# Patient Record
Sex: Male | Born: 1935
Health system: Southern US, Community
[De-identification: ages and names within clinical notes are randomized; demographics above are authoritative.]

## PROBLEM LIST (undated history)

## (undated) DIAGNOSIS — Z87442 Personal history of urinary calculi: Secondary | ICD-10-CM

## (undated) DIAGNOSIS — I4891 Unspecified atrial fibrillation: Secondary | ICD-10-CM

## (undated) DIAGNOSIS — I509 Heart failure, unspecified: Secondary | ICD-10-CM

## (undated) DIAGNOSIS — Z95810 Presence of automatic (implantable) cardiac defibrillator: Secondary | ICD-10-CM

## (undated) DIAGNOSIS — Z8739 Personal history of other diseases of the musculoskeletal system and connective tissue: Secondary | ICD-10-CM

## (undated) DIAGNOSIS — M1990 Unspecified osteoarthritis, unspecified site: Secondary | ICD-10-CM

## (undated) DIAGNOSIS — E785 Hyperlipidemia, unspecified: Secondary | ICD-10-CM

## (undated) DIAGNOSIS — I5022 Chronic systolic (congestive) heart failure: Secondary | ICD-10-CM

## (undated) DIAGNOSIS — J189 Pneumonia, unspecified organism: Secondary | ICD-10-CM

## (undated) DIAGNOSIS — N179 Acute kidney failure, unspecified: Secondary | ICD-10-CM

## (undated) DIAGNOSIS — R296 Repeated falls: Secondary | ICD-10-CM

## (undated) DIAGNOSIS — I10 Essential (primary) hypertension: Secondary | ICD-10-CM

## (undated) DIAGNOSIS — Z9289 Personal history of other medical treatment: Secondary | ICD-10-CM

## (undated) HISTORY — PX: LITHOTRIPSY: SUR834

## (undated) HISTORY — PX: POLYPECTOMY: SHX149

## (undated) HISTORY — DX: Chronic systolic (congestive) heart failure: I50.22

## (undated) HISTORY — DX: Unspecified atrial fibrillation: I48.91

## (undated) HISTORY — DX: Hyperlipidemia, unspecified: E78.5

---

## 1998-03-11 ENCOUNTER — Other Ambulatory Visit: Admission: RE | Admit: 1998-03-11 | Discharge: 1998-03-11 | Payer: Self-pay | Admitting: Family Medicine

## 1998-04-24 ENCOUNTER — Inpatient Hospital Stay (HOSPITAL_COMMUNITY): Admission: EM | Admit: 1998-04-24 | Discharge: 1998-04-29 | Payer: Self-pay | Admitting: Emergency Medicine

## 1998-04-28 ENCOUNTER — Encounter: Payer: Self-pay | Admitting: Interventional Cardiology

## 1998-06-18 ENCOUNTER — Ambulatory Visit (HOSPITAL_COMMUNITY): Admission: RE | Admit: 1998-06-18 | Discharge: 1998-06-18 | Payer: Self-pay | Admitting: Family Medicine

## 2002-04-05 ENCOUNTER — Encounter: Admission: RE | Admit: 2002-04-05 | Discharge: 2002-04-05 | Payer: Self-pay | Admitting: Family Medicine

## 2002-04-05 ENCOUNTER — Encounter: Payer: Self-pay | Admitting: Family Medicine

## 2002-04-10 ENCOUNTER — Encounter: Payer: Self-pay | Admitting: Family Medicine

## 2002-04-10 ENCOUNTER — Encounter: Admission: RE | Admit: 2002-04-10 | Discharge: 2002-04-10 | Payer: Self-pay | Admitting: Family Medicine

## 2005-12-31 ENCOUNTER — Encounter: Admission: RE | Admit: 2005-12-31 | Discharge: 2005-12-31 | Payer: Self-pay | Admitting: Family Medicine

## 2006-01-12 ENCOUNTER — Encounter: Payer: Self-pay | Admitting: Family Medicine

## 2006-10-14 ENCOUNTER — Ambulatory Visit (HOSPITAL_COMMUNITY): Admission: RE | Admit: 2006-10-14 | Discharge: 2006-10-14 | Payer: Self-pay | Admitting: Family Medicine

## 2007-09-25 ENCOUNTER — Ambulatory Visit (HOSPITAL_COMMUNITY): Admission: RE | Admit: 2007-09-25 | Discharge: 2007-09-25 | Payer: Self-pay | Admitting: Family Medicine

## 2007-09-27 ENCOUNTER — Ambulatory Visit (HOSPITAL_COMMUNITY): Admission: RE | Admit: 2007-09-27 | Discharge: 2007-09-27 | Payer: Self-pay | Admitting: Family Medicine

## 2008-06-03 ENCOUNTER — Ambulatory Visit (HOSPITAL_COMMUNITY): Admission: RE | Admit: 2008-06-03 | Discharge: 2008-06-03 | Payer: Self-pay | Admitting: Urology

## 2008-07-25 ENCOUNTER — Ambulatory Visit (HOSPITAL_COMMUNITY): Admission: RE | Admit: 2008-07-25 | Discharge: 2008-07-25 | Payer: Self-pay | Admitting: Urology

## 2009-10-14 ENCOUNTER — Ambulatory Visit: Payer: Self-pay | Admitting: Internal Medicine

## 2009-10-14 ENCOUNTER — Inpatient Hospital Stay (HOSPITAL_COMMUNITY): Admission: AD | Admit: 2009-10-14 | Discharge: 2009-10-31 | Payer: Self-pay | Admitting: Interventional Cardiology

## 2009-10-14 HISTORY — PX: CARDIAC DEFIBRILLATOR PLACEMENT: SHX171

## 2009-10-15 ENCOUNTER — Encounter (INDEPENDENT_AMBULATORY_CARE_PROVIDER_SITE_OTHER): Payer: Self-pay | Admitting: Interventional Cardiology

## 2009-12-25 ENCOUNTER — Encounter (INDEPENDENT_AMBULATORY_CARE_PROVIDER_SITE_OTHER): Payer: Self-pay | Admitting: *Deleted

## 2009-12-31 ENCOUNTER — Encounter: Payer: Self-pay | Admitting: Internal Medicine

## 2010-01-01 ENCOUNTER — Ambulatory Visit: Payer: Self-pay

## 2010-01-01 ENCOUNTER — Encounter: Payer: Self-pay | Admitting: Internal Medicine

## 2010-02-10 ENCOUNTER — Ambulatory Visit: Payer: Self-pay | Admitting: Internal Medicine

## 2010-02-10 DIAGNOSIS — I480 Paroxysmal atrial fibrillation: Secondary | ICD-10-CM

## 2010-02-10 DIAGNOSIS — Z95810 Presence of automatic (implantable) cardiac defibrillator: Secondary | ICD-10-CM

## 2010-02-10 DIAGNOSIS — I5022 Chronic systolic (congestive) heart failure: Secondary | ICD-10-CM

## 2010-02-10 DIAGNOSIS — N184 Chronic kidney disease, stage 4 (severe): Secondary | ICD-10-CM | POA: Insufficient documentation

## 2010-10-13 NOTE — Letter (Signed)
Summary: Device-Delinquent Check  [facility], Main Office  [address]   [place], [place] [address]   Phone: [phone]  Fax: [phone]     December 25, 2009 MRN: [id number]   MANFRED LASPINA [address] [place], [place]  [address]   Dear Mr. Ehrsam,  According to our records, you have not had your implanted device checked in the recommended period of time.  We are unable to determine appropriate device function without checking your device on a regular basis.  Please call our office to schedule an appointment as soon as possible.  If you are having your device checked by another physician, please call us so that we may update our records.  Thank you,  Altha Harm, LPN  December 25, 2009 3:56 PM  [facility]

## 2010-10-13 NOTE — Assessment & Plan Note (Signed)
Summary: DEVICE/SAF   Visit Type:  Follow-up Primary Provider:  Ronne Binning, MD   History of Present Illness: [name] returns today for ICD followup.  He is a pleasant 77 yo man with a h/o DCM and class 3B CHF. He underwent  BiV ICD 3 months ago and since then has improved.  When he initially presented, he was in shock but gradually improved.  He denies c/p.  He has rare palpitations.  He notes that his heart rate is sometimes low.  No peripheral edema.  Overall his CHF is much improved.  He has not been back in the hospital.    Current Medications (verified): 1)  Amiodarone Hcl 200 Mg Tabs (Amiodarone Hcl) .... Take One Tablet By Mouth Daily 2)  Spironolactone 25 Mg Tabs (Spironolactone) .... Take One Tablet By Mouth Daily 3)  Isosorbide Mononitrate Cr 60 Mg Xr24h-Tab (Isosorbide Mononitrate) .... Take One Tablet By Mouth Daily 4)  Simvastatin 40 Mg Tabs (Simvastatin) .... Take One Tablet By Mouth Daily At Bedtime 5)  Warfarin Sodium 3 Mg Tabs (Warfarin Sodium) .... Use As Directed By Anticoagualtion Clinic 6)  Carvedilol 6.25 Mg Tabs (Carvedilol) .... Take One Tablet By Mouth Twice A Day 7)  Potassium Chloride Crys Cr 20 Meq Cr-Tabs (Potassium Chloride Crys Cr) .... Take One Tablet By Mouth Daily 8)  Furosemide 40 Mg Tabs (Furosemide) .... Take 2 Tablets By Mouth Two Times A Day 9)  Ventolin Hfa 108 (90 Base) Mcg/act Aers (Albuterol Sulfate) .... Uad 10)  Colcrys 0.6 Mg Tabs (Colchicine) .... Take One Tablet By Mouth Once Daily.  Past History:  Past Medical History: Current Problems:  IMPLANTATION OF DEFIBRILLATOR, HX OF (ICD-V45.02) RENAL FAILURE, ACUTE (ICD-584.9) ATRIAL FIBRILLATION (ICD-427.31)    Review of Systems  The patient denies chest pain, syncope, dyspnea on exertion, and peripheral edema.    Vital Signs:  Patient profile:   77 year old male Height:      67 inches Weight:      147 pounds BMI:     23.11 Pulse rate:   78 / minute Pulse rhythm:   regular BP  sitting:   88 / 70  (left arm)  Vitals Entered By: Laurance Flatten CMA (Feb 10, 2010 3:23 PM)  Physical Exam  General:  Well developed, well nourished, in no acute distress.  HEENT: normal Neck: supple. No JVD. Carotids 2+ bilaterally no bruits Cor: RRR no rubs, gallops or murmur. PMI is enlarged and laterally displaced. Lungs: CTA with no wheezes, rales, or rhonchi. Ab: soft, nontender. nondistended. No HSM. Good bowel sounds Ext: warm. no cyanosis, clubbing or edema Neuro: alert and oriented. Grossly nonfocal. affect pleasant     ICD Specifications Following MD:  [name], MD     ICD Vendor:  St Jude     ICD Model Number:  [id number]     ICD Serial Number:  [id number] ICD DOI:  10/24/2009     ICD Implanting MD:  [name], MD  Lead 1:    Location: RA     DOI: 10/24/2009     Model #: [id number]     Serial #: [id number]     Status: active Lead 2:    Location: RV     DOI: 10/24/2009     Model #: 0865     Serial #: [id number]     Status: active Lead 3:    Location: LV     DOI: 10/24/2009     Model #: [id number]  Serial #: [id number]     Status: active  ICD Follow Up ICD Dependent:  No      Episodes Coumadin:  No  Brady Parameters Mode DDDR     Lower Rate Limit:  60     Upper Rate Limit 120 PAV 180     Sensed AV Delay:  160  Tachy Zones VF:  181     MD Comments:  LV output reprogrammed with 2 X safety margin.  Impression & Recommendations:  Problem # 1:  IMPLANTATION OF DEFIBRILLATOR, HX OF (ICD-V45.02) His device is working normally.  Will recheck in several months.  Problem # 2:  CHRONIC SYSTOLIC HEART FAILURE (ICD-428.22) His symptoms are now class 2.  Will continue his current meds and maintain a low sodium diet. His updated medication list for this problem includes:    Amiodarone Hcl 200 Mg Tabs (Amiodarone hcl) .[name]... Take one tablet by mouth daily    Spironolactone 25 Mg Tabs (Spironolactone) .[name]... Take one tablet by mouth daily    Isosorbide Mononitrate Cr 60 Mg Xr24h-tab  (Isosorbide mononitrate) .[name]... Take one tablet by mouth daily    Warfarin Sodium 3 Mg Tabs (Warfarin sodium) ..... Use as directed by anticoagualtion clinic    Carvedilol 6.25 Mg Tabs (Carvedilol) .[name]... Take one tablet by mouth twice a day    Furosemide 40 Mg Tabs (Furosemide) .[name]... Take 2 tablets by mouth two times a day  Patient Instructions: 1)  Your physician recommends that you schedule a follow-up appointment in: Feb 2012

## 2010-10-13 NOTE — Cardiovascular Report (Signed)
Summary: Office Visit   Office Visit   Imported By: Roderic Ovens 02/13/2010 09:53:32  _____________________________________________________________________  External Attachment:    Type:   Image     Comment:   External Document

## 2010-10-13 NOTE — Miscellaneous (Signed)
Summary: Device preload  Clinical Lists Changes  Observations: Added new observation of ICDLEADSTAT3: active (12/31/2009 12:59) Added new observation of ICDLEADSER3: [id number] V (12/31/2009 12:59) Added new observation of ICDLEADMOD3: 4196  (12/31/2009 12:59) Added new observation of ICDLEADLOC3: LV  (12/31/2009 12:59) Added new observation of ICDLEADSTAT2: active  (12/31/2009 12:59) Added new observation of ICDLEADSER2: [id number]  (12/31/2009 12:59) Added new observation of ICDLEADMOD2: 7121  (12/31/2009 12:59) Added new observation of ICDLEADLOC2: RV  (12/31/2009 12:59) Added new observation of ICDLEADSTAT1: active  (12/31/2009 12:59) Added new observation of ICDLEADSER1: [id number]  (12/31/2009 12:59) Added new observation of ICDLEADMOD1: 3500XF  (12/31/2009 12:59) Added new observation of ICDLEADLOC1: RA  (12/31/2009 12:59) Added new observation of ICD IMP MD: [name], MD  (12/31/2009 12:59) Added new observation of ICDLEADDOI3: 10/24/2009  (12/31/2009 12:59) Added new observation of ICDLEADDOI2: 10/24/2009  (12/31/2009 12:59) Added new observation of ICDLEADDOI1: 10/24/2009  (12/31/2009 12:59) Added new observation of ICD IMPL DTE: 10/24/2009  (12/31/2009 12:59) Added new observation of ICD SERL#: [id number]  (12/31/2009 12:59) Added new observation of ICD MODL#: [id number]  (12/31/2009 78:93) Added new observation of ICDMANUFACTR: St Jude  (12/31/2009 12:59) Added new observation of ICD MD: [name], MD  (12/31/2009 12:59)       ICD Specifications Following MD:  [name], MD     ICD Vendor:  St Jude     ICD Model Number:  [id number]     ICD Serial Number:  [id number] ICD DOI:  10/24/2009     ICD Implanting MD:  [name], MD  Lead 1:    Location: RA     DOI: 10/24/2009     Model #: [id number]     Serial #: [id number]     Status: active Lead 2:    Location: RV     DOI: 10/24/2009     Model #: 1540     Serial #: [id number]     Status: active Lead 3:    Location: LV     DOI: 10/24/2009      Model #: 4196     Serial #: [id number]     Status: active

## 2010-10-13 NOTE — Procedures (Signed)
Summary: pcp/jml    ICD Specifications Following MD:  [name], MD     ICD Vendor:  St Jude     ICD Model Number:  [phone]     ICD Serial Number:  [id number] ICD DOI:  10/24/2009     ICD Implanting MD:  [name], MD  Lead 1:    Location: RA     DOI: 10/24/2009     Model #: [id number]     Serial #: [id number]     Status: active Lead 2:    Location: RV     DOI: 10/24/2009     Model #: 0865     Serial #: [id number]     Status: active Lead 3:    Location: LV     DOI: 10/24/2009     Model #: 4196     Serial #: [id number]     Status: active  ICD Follow Up Remote Check?  No Charge Time:  8.6 seconds     Battery Est. Longevity:  4.6 years Underlying rhythm:  SR ICD Dependent:  No       ICD Device Measurements Atrium:  Amplitude: 1.9 mV, Impedance: 430 ohms, Threshold: 0.875 V at 0.5 msec Right Ventricle:  Amplitude: 11.7 mV, Impedance: 480 ohms, Threshold: 0.625 V at 0.5 msec Left Ventricle:  Impedance: 790 ohms, Threshold: 2.25 V at 1.5 msec Shock Impedance: 52 ohms   Episodes MS Episodes:  0     Percent Mode Switch:  0     Coumadin:  No Shock:  0     ATP:  0     Nonsustained:  0     Atrial Pacing:  69%     Ventricular Pacing:  95%  Brady Parameters Mode DDDR     Lower Rate Limit:  60     Upper Rate Limit 120 PAV 180     Sensed AV Delay:  160  Tachy Zones VF:  181     Next Cardiology Appt Due:  01/11/2010 Tech Comments:  LV reprogrammed 3.5@1 .5 because of elevated threhold value today.  Steri strips removed by the patient.  The wound is well healed although the device on this small framed man is superficial. He also follows with Dr. Katrinka Blazing but will come back in one month for an appoointment with Dr. Ladona Ridgel.   Altha Harm, LPN  January 01, 2010 11:49 AM  MD Comments:  Agree with above.

## 2010-10-13 NOTE — Cardiovascular Report (Signed)
Summary: Office Visit   Office Visit   Imported By: Roderic Ovens 01/27/2010 13:49:22  _____________________________________________________________________  External Attachment:    Type:   Image     Comment:   External Document

## 2010-11-27 ENCOUNTER — Encounter (INDEPENDENT_AMBULATORY_CARE_PROVIDER_SITE_OTHER): Payer: Self-pay | Admitting: *Deleted

## 2010-12-01 NOTE — Letter (Signed)
Summary: Appointment - Reminder 2  [facility], Main Office  [address]   [place], [place] [address]   Phone: [phone]  Fax: [phone]     November 27, 2010 MRN: [id number]   Tyler Gray [address] [place], [place]  [address]   Dear Tyler Gray,  Our records indicate that it is time to schedule a follow-up appointment.  Dr.Taylor recommended that you follow up with [place] in June. It is very important that we reach you to schedule this appointment. We look forward to participating in your health care needs. Please contact us at the number listed above at your earliest convenience to schedule your appointment.  If you are unable to make an appointment at this time, give [place] a call so we can update our records.     Sincerely,   Glass blower/designer

## 2010-12-02 LAB — BASIC METABOLIC PANEL
BUN: 24 mg/dL — ABNORMAL HIGH (ref 6–23)
BUN: 29 mg/dL — ABNORMAL HIGH (ref 6–23)
BUN: 33 mg/dL — ABNORMAL HIGH (ref 6–23)
BUN: 40 mg/dL — ABNORMAL HIGH (ref 6–23)
BUN: 40 mg/dL — ABNORMAL HIGH (ref 6–23)
BUN: 41 mg/dL — ABNORMAL HIGH (ref 6–23)
BUN: 41 mg/dL — ABNORMAL HIGH (ref 6–23)
BUN: 42 mg/dL — ABNORMAL HIGH (ref 6–23)
BUN: 42 mg/dL — ABNORMAL HIGH (ref 6–23)
BUN: 44 mg/dL — ABNORMAL HIGH (ref 6–23)
BUN: 47 mg/dL — ABNORMAL HIGH (ref 6–23)
CO2: 21 mEq/L (ref 19–32)
CO2: 22 mEq/L (ref 19–32)
CO2: 23 mEq/L (ref 19–32)
CO2: 23 mEq/L (ref 19–32)
CO2: 24 mEq/L (ref 19–32)
CO2: 25 mEq/L (ref 19–32)
CO2: 25 mEq/L (ref 19–32)
CO2: 26 mEq/L (ref 19–32)
Calcium: 8.2 mg/dL — ABNORMAL LOW (ref 8.4–10.5)
Calcium: 8.2 mg/dL — ABNORMAL LOW (ref 8.4–10.5)
Calcium: 8.3 mg/dL — ABNORMAL LOW (ref 8.4–10.5)
Calcium: 8.4 mg/dL (ref 8.4–10.5)
Calcium: 8.5 mg/dL (ref 8.4–10.5)
Calcium: 8.6 mg/dL (ref 8.4–10.5)
Calcium: 8.6 mg/dL (ref 8.4–10.5)
Calcium: 8.7 mg/dL (ref 8.4–10.5)
Calcium: 8.7 mg/dL (ref 8.4–10.5)
Calcium: 8.8 mg/dL (ref 8.4–10.5)
Calcium: 8.8 mg/dL (ref 8.4–10.5)
Chloride: 101 mEq/L (ref 96–112)
Chloride: 101 mEq/L (ref 96–112)
Chloride: 102 mEq/L (ref 96–112)
Chloride: 102 mEq/L (ref 96–112)
Chloride: 103 mEq/L (ref 96–112)
Chloride: 103 mEq/L (ref 96–112)
Creatinine, Ser: 1.66 mg/dL — ABNORMAL HIGH (ref 0.4–1.5)
Creatinine, Ser: 1.67 mg/dL — ABNORMAL HIGH (ref 0.4–1.5)
Creatinine, Ser: 1.81 mg/dL — ABNORMAL HIGH (ref 0.4–1.5)
Creatinine, Ser: 1.88 mg/dL — ABNORMAL HIGH (ref 0.4–1.5)
Creatinine, Ser: 1.91 mg/dL — ABNORMAL HIGH (ref 0.4–1.5)
Creatinine, Ser: 1.94 mg/dL — ABNORMAL HIGH (ref 0.4–1.5)
Creatinine, Ser: 2.02 mg/dL — ABNORMAL HIGH (ref 0.4–1.5)
Creatinine, Ser: 2.02 mg/dL — ABNORMAL HIGH (ref 0.4–1.5)
Creatinine, Ser: 2.04 mg/dL — ABNORMAL HIGH (ref 0.4–1.5)
GFR calc Af Amer: 37 mL/min — ABNORMAL LOW (ref >60)
GFR calc Af Amer: 39 mL/min — ABNORMAL LOW (ref >60)
GFR calc Af Amer: 39 mL/min — ABNORMAL LOW (ref >60)
GFR calc Af Amer: 40 mL/min — ABNORMAL LOW (ref >60)
GFR calc Af Amer: 41 mL/min — ABNORMAL LOW (ref >60)
GFR calc Af Amer: 41 mL/min — ABNORMAL LOW (ref >60)
GFR calc Af Amer: 41 mL/min — ABNORMAL LOW (ref >60)
GFR calc Af Amer: 43 mL/min — ABNORMAL LOW (ref >60)
GFR calc Af Amer: 44 mL/min — ABNORMAL LOW (ref >60)
GFR calc non Af Amer: 30 mL/min — ABNORMAL LOW (ref >60)
GFR calc non Af Amer: 31 mL/min — ABNORMAL LOW (ref >60)
GFR calc non Af Amer: 32 mL/min — ABNORMAL LOW (ref >60)
GFR calc non Af Amer: 32 mL/min — ABNORMAL LOW (ref >60)
GFR calc non Af Amer: 32 mL/min — ABNORMAL LOW (ref >60)
GFR calc non Af Amer: 33 mL/min — ABNORMAL LOW (ref >60)
GFR calc non Af Amer: 34 mL/min — ABNORMAL LOW (ref >60)
GFR calc non Af Amer: 34 mL/min — ABNORMAL LOW (ref >60)
GFR calc non Af Amer: 35 mL/min — ABNORMAL LOW (ref >60)
GFR calc non Af Amer: 35 mL/min — ABNORMAL LOW (ref >60)
GFR calc non Af Amer: 37 mL/min — ABNORMAL LOW (ref >60)
GFR calc non Af Amer: 40 mL/min — ABNORMAL LOW (ref >60)
GFR calc non Af Amer: 41 mL/min — ABNORMAL LOW (ref >60)
Glucose, Bld: 110 mg/dL — ABNORMAL HIGH (ref 70–99)
Glucose, Bld: 111 mg/dL — ABNORMAL HIGH (ref 70–99)
Glucose, Bld: 115 mg/dL — ABNORMAL HIGH (ref 70–99)
Glucose, Bld: 119 mg/dL — ABNORMAL HIGH (ref 70–99)
Glucose, Bld: 120 mg/dL — ABNORMAL HIGH (ref 70–99)
Glucose, Bld: 125 mg/dL — ABNORMAL HIGH (ref 70–99)
Glucose, Bld: 140 mg/dL — ABNORMAL HIGH (ref 70–99)
Glucose, Bld: 143 mg/dL — ABNORMAL HIGH (ref 70–99)
Potassium: 3.8 mEq/L (ref 3.5–5.1)
Potassium: 4 mEq/L (ref 3.5–5.1)
Potassium: 4 mEq/L (ref 3.5–5.1)
Potassium: 4.1 mEq/L (ref 3.5–5.1)
Potassium: 4.2 mEq/L (ref 3.5–5.1)
Potassium: 4.4 mEq/L (ref 3.5–5.1)
Potassium: 4.4 mEq/L (ref 3.5–5.1)
Potassium: 4.4 mEq/L (ref 3.5–5.1)
Potassium: 4.6 mEq/L (ref 3.5–5.1)
Potassium: 4.8 mEq/L (ref 3.5–5.1)
Sodium: 133 mEq/L — ABNORMAL LOW (ref 135–145)
Sodium: 133 mEq/L — ABNORMAL LOW (ref 135–145)
Sodium: 134 mEq/L — ABNORMAL LOW (ref 135–145)
Sodium: 134 mEq/L — ABNORMAL LOW (ref 135–145)
Sodium: 134 mEq/L — ABNORMAL LOW (ref 135–145)
Sodium: 136 mEq/L (ref 135–145)
Sodium: 137 mEq/L (ref 135–145)
Sodium: 138 mEq/L (ref 135–145)
Sodium: 138 mEq/L (ref 135–145)
Sodium: 139 mEq/L (ref 135–145)

## 2010-12-02 LAB — DIFFERENTIAL
Basophils Absolute: 0 10*3/uL (ref 0.0–0.1)
Basophils Absolute: 0 10*3/uL (ref 0.0–0.1)
Basophils Absolute: 0 10*3/uL (ref 0.0–0.1)
Basophils Relative: 1 % (ref 0–1)
Eosinophils Absolute: 0 10*3/uL (ref 0.0–0.7)
Eosinophils Relative: 2 % (ref 0–5)
Lymphocytes Relative: 19 % (ref 12–46)
Lymphocytes Relative: 2 % — ABNORMAL LOW (ref 12–46)
Lymphocytes Relative: 27 % (ref 12–46)
Lymphs Abs: 0.3 10*3/uL — ABNORMAL LOW (ref 0.7–4.0)
Lymphs Abs: 1.1 10*3/uL (ref 0.7–4.0)
Lymphs Abs: 1.1 10*3/uL (ref 0.7–4.0)
Monocytes Relative: 10 % (ref 3–12)
Neutro Abs: 2.6 10*3/uL (ref 1.7–7.7)
Neutro Abs: 3.8 10*3/uL (ref 1.7–7.7)
Neutrophils Relative %: 92 % — ABNORMAL HIGH (ref 43–77)

## 2010-12-02 LAB — COMPREHENSIVE METABOLIC PANEL
ALT: 51 U/L (ref 0–53)
Albumin: 3.5 g/dL (ref 3.5–5.2)
Alkaline Phosphatase: 33 U/L — ABNORMAL LOW (ref 39–117)
BUN: 41 mg/dL — ABNORMAL HIGH (ref 6–23)
Calcium: 9 mg/dL (ref 8.4–10.5)
Creatinine, Ser: 2.12 mg/dL — ABNORMAL HIGH (ref 0.4–1.5)
GFR calc Af Amer: 37 mL/min — ABNORMAL LOW (ref >60)
Total Protein: 7.7 g/dL (ref 6.0–8.3)

## 2010-12-02 LAB — PROTIME-INR
INR: 2.14 — ABNORMAL HIGH (ref 0.00–1.49)
INR: 2.29 — ABNORMAL HIGH (ref 0.00–1.49)
INR: 2.4 — ABNORMAL HIGH (ref 0.00–1.49)
INR: 2.43 — ABNORMAL HIGH (ref 0.00–1.49)
INR: 2.66 — ABNORMAL HIGH (ref 0.00–1.49)
INR: 2.68 — ABNORMAL HIGH (ref 0.00–1.49)
INR: 2.74 — ABNORMAL HIGH (ref 0.00–1.49)
INR: 2.75 — ABNORMAL HIGH (ref 0.00–1.49)
INR: 2.88 — ABNORMAL HIGH (ref 0.00–1.49)
INR: 3 — ABNORMAL HIGH (ref 0.00–1.49)
INR: 3.44 — ABNORMAL HIGH (ref 0.00–1.49)
Prothrombin Time: 21.1 seconds — ABNORMAL HIGH (ref 11.6–15.2)
Prothrombin Time: 23.7 seconds — ABNORMAL HIGH (ref 11.6–15.2)
Prothrombin Time: 25.3 seconds — ABNORMAL HIGH (ref 11.6–15.2)
Prothrombin Time: 26 seconds — ABNORMAL HIGH (ref 11.6–15.2)
Prothrombin Time: 26.2 seconds — ABNORMAL HIGH (ref 11.6–15.2)
Prothrombin Time: 26.3 seconds — ABNORMAL HIGH (ref 11.6–15.2)
Prothrombin Time: 28.3 seconds — ABNORMAL HIGH (ref 11.6–15.2)
Prothrombin Time: 28.9 seconds — ABNORMAL HIGH (ref 11.6–15.2)
Prothrombin Time: 29.9 seconds — ABNORMAL HIGH (ref 11.6–15.2)

## 2010-12-02 LAB — SEDIMENTATION RATE: Sed Rate: 103 mm/hr — ABNORMAL HIGH (ref 0–16)

## 2010-12-02 LAB — CBC
HCT: 36.7 % — ABNORMAL LOW (ref 39.0–52.0)
HCT: 42.8 % (ref 39.0–52.0)
Hemoglobin: 13.8 g/dL (ref 13.0–17.0)
MCHC: 32.4 g/dL (ref 30.0–36.0)
MCV: 103.7 fL — ABNORMAL HIGH (ref 78.0–100.0)
MCV: 99.5 fL (ref 78.0–100.0)
Platelets: 114 10*3/uL — ABNORMAL LOW (ref 150–400)
Platelets: 135 10*3/uL — ABNORMAL LOW (ref 150–400)
RDW: 16.4 % — ABNORMAL HIGH (ref 11.5–15.5)
WBC: 16.8 10*3/uL — ABNORMAL HIGH (ref 4.0–10.5)
WBC: 5.8 10*3/uL (ref 4.0–10.5)

## 2010-12-02 LAB — BRAIN NATRIURETIC PEPTIDE
Pro B Natriuretic peptide (BNP): 1490 pg/mL — ABNORMAL HIGH (ref 0.0–100.0)
Pro B Natriuretic peptide (BNP): 2379 pg/mL — ABNORMAL HIGH (ref 0.0–100.0)
Pro B Natriuretic peptide (BNP): >3200 pg/mL — ABNORMAL HIGH (ref 0.0–100.0)
Pro B Natriuretic peptide (BNP): >3200 pg/mL — ABNORMAL HIGH (ref 0.0–100.0)
Pro B Natriuretic peptide (BNP): >3200 pg/mL — ABNORMAL HIGH (ref 0.0–100.0)
Pro B Natriuretic peptide (BNP): >3200 pg/mL — ABNORMAL HIGH (ref 0.0–100.0)
Pro B Natriuretic peptide (BNP): >3200 pg/mL — ABNORMAL HIGH (ref 0.0–100.0)

## 2010-12-02 LAB — CARDIAC PANEL(CRET KIN+CKTOT+MB+TROPI)
CK, MB: 3.2 ng/mL (ref 0.3–4.0)
Relative Index: INVALID (ref 0.0–2.5)
Relative Index: INVALID (ref 0.0–2.5)
Total CK: 78 U/L (ref 7–232)
Troponin I: 0.04 ng/mL (ref 0.00–0.06)

## 2010-12-02 LAB — GLUCOSE, CAPILLARY: Glucose-Capillary: 100 mg/dL — ABNORMAL HIGH (ref 70–99)

## 2010-12-02 LAB — APTT: aPTT: 36 seconds (ref 24–37)

## 2011-03-24 ENCOUNTER — Encounter: Payer: Self-pay | Admitting: Internal Medicine

## 2011-03-25 ENCOUNTER — Encounter: Payer: Self-pay | Admitting: Internal Medicine

## 2011-03-25 ENCOUNTER — Ambulatory Visit (INDEPENDENT_AMBULATORY_CARE_PROVIDER_SITE_OTHER): Payer: Medicare Other | Admitting: Internal Medicine

## 2011-03-25 DIAGNOSIS — Z95810 Presence of automatic (implantable) cardiac defibrillator: Secondary | ICD-10-CM

## 2011-03-25 DIAGNOSIS — I428 Other cardiomyopathies: Secondary | ICD-10-CM

## 2011-03-25 DIAGNOSIS — I5022 Chronic systolic (congestive) heart failure: Secondary | ICD-10-CM

## 2011-03-25 DIAGNOSIS — I4891 Unspecified atrial fibrillation: Secondary | ICD-10-CM

## 2011-03-25 NOTE — Assessment & Plan Note (Signed)
He is maintaining sinus rhythm on amiodarone. He will continue his current medical therapy.

## 2011-03-25 NOTE — Assessment & Plan Note (Signed)
His device is working normally. We'll recheck in several months. 

## 2011-03-25 NOTE — Progress Notes (Signed)
HPI [name] returns today for followup. He is a pleasant 77 year old man with a long-standing nonischemic cardiomyopathy, chronic systolic heart failure, status post BIV ICD implantation. Since we last saw the patient, he has not required hospitalization. He denies chest pain or peripheral edema. He has had no syncope and denies any defibrillator shocks. Not on File   Current Outpatient Prescriptions  Medication Sig Dispense Refill  . amiodarone (PACERONE) 200 MG tablet Take 200 mg by mouth daily.        . carvedilol (COREG) 6.25 MG tablet Take 6.25 mg by mouth 2 (two) times daily with a meal.        . colchicine 0.6 MG tablet Take 0.6 mg by mouth daily.        . furosemide (LASIX) 40 MG tablet Take 40 mg by mouth daily.       . isosorbide mononitrate (IMDUR) 60 MG 24 hr tablet Take 60 mg by mouth daily.        . potassium chloride SA (K-DUR,KLOR-CON) 20 MEQ tablet Take 20 mEq by mouth daily.        . simvastatin (ZOCOR) 40 MG tablet Take 20 mg by mouth at bedtime.       [name] spironolactone (ALDACTONE) 25 MG tablet Take 25 mg by mouth daily.        [name] warfarin (COUMADIN) 3 MG tablet Take 3 mg by mouth as directed.           Past Medical History  Diagnosis Date  . Renal failure     acute  . Atrial fibrillation     ROS:   All systems reviewed and negative except as noted in the HPI.   Past Surgical History  Procedure Date  . Cardiac defibrillator placement   . Polypectomy      Family History  Problem Relation Age of Onset  . Other      notable for CHF     History   Social History  . Marital Status: Married    Spouse Name: N/A    Number of Children: 2  . Years of Education: N/A   Occupational History  . retired Environmental manager    Social History Main Topics  . Smoking status: Never Smoker   . Smokeless tobacco: Not on file  . Alcohol Use: No  . Drug Use: No  . Sexually Active: Not on file   Other Topics Concern  . Not on file   Social History Narrative  . No  narrative on file     BP 103/76  Pulse 60  Ht 5\' 8"  (1.727 m)  Wt 155 lb (70.308 kg)  BMI 23.57 kg/m2  Physical Exam:  Well appearing NAD HEENT: Unremarkable Neck:  No JVD, no thyromegally Lymphatics:  No adenopathy Back:  No CVA tenderness Lungs:  Clear. Well-healed ICD incision HEART:  Regular rate rhythm, no murmurs, no rubs, no clicks Abd:  soft, positive bowel sounds, no organomegally, no rebound, no guarding Ext:  2 plus pulses, no edema, no cyanosis, no clubbing Skin:  No rashes no nodules Neuro:  CN II through XII intact, motor grossly intact  DEVICE  Normal device function.  See PaceArt for details.   Assess/Plan:

## 2011-03-25 NOTE — Assessment & Plan Note (Signed)
His heart failure is class II. He will continue his current medications and maintain a low-sodium diet.

## 2011-06-03 LAB — CREATININE, SERUM
Creatinine, Ser: 1.55 — ABNORMAL HIGH
Creatinine, Ser: 1.59 — ABNORMAL HIGH
GFR calc Af Amer: 53 — ABNORMAL LOW

## 2011-06-15 LAB — PROTIME-INR
INR: 1.2
Prothrombin Time: 15.9 — ABNORMAL HIGH

## 2011-06-24 ENCOUNTER — Other Ambulatory Visit: Payer: Self-pay | Admitting: Internal Medicine

## 2011-06-24 ENCOUNTER — Encounter: Payer: Self-pay | Admitting: Internal Medicine

## 2011-06-24 ENCOUNTER — Ambulatory Visit (INDEPENDENT_AMBULATORY_CARE_PROVIDER_SITE_OTHER): Payer: Medicare Other | Admitting: *Deleted

## 2011-06-24 DIAGNOSIS — I4891 Unspecified atrial fibrillation: Secondary | ICD-10-CM

## 2011-06-24 DIAGNOSIS — I428 Other cardiomyopathies: Secondary | ICD-10-CM

## 2011-06-24 LAB — REMOTE ICD DEVICE
AL IMPEDENCE ICD: 340 Ohm
BAMS-0001: 150 {beats}/min
BRDY-0002RV: 60 {beats}/min
BRDY-0003RV: 120 {beats}/min
BRDY-0004RV: 120 {beats}/min
DEVICE MODEL ICD: 607910
HV IMPEDENCE: 46 Ohm
LV LEAD IMPEDENCE ICD: 680 Ohm
RV LEAD AMPLITUDE: 11.7 mv
RV LEAD IMPEDENCE ICD: 480 Ohm
VENTRICULAR PACING ICD: 87 pct

## 2011-06-28 ENCOUNTER — Encounter: Payer: Medicare Other | Admitting: *Deleted

## 2011-06-28 NOTE — Progress Notes (Signed)
icd remote check  

## 2011-07-13 ENCOUNTER — Encounter: Payer: Self-pay | Admitting: *Deleted

## 2011-08-09 ENCOUNTER — Ambulatory Visit (INDEPENDENT_AMBULATORY_CARE_PROVIDER_SITE_OTHER): Payer: Medicare Other | Admitting: *Deleted

## 2011-08-09 DIAGNOSIS — I5022 Chronic systolic (congestive) heart failure: Secondary | ICD-10-CM

## 2011-08-09 DIAGNOSIS — I428 Other cardiomyopathies: Secondary | ICD-10-CM

## 2011-08-09 LAB — ICD DEVICE OBSERVATION
AL THRESHOLD: 0.75 V
DEVICE MODEL ICD: 607910
FVT: 0
HV IMPEDENCE: 53 Ohm
MODE SWITCH EPISODES: 0
PACEART VT: 0
RV LEAD AMPLITUDE: 11.7 mv
RV LEAD IMPEDENCE ICD: 562.5 Ohm
TOT-0009: 0
TOT-0010: 7

## 2011-08-09 NOTE — Progress Notes (Signed)
ICD check with ICM 

## 2011-09-02 ENCOUNTER — Encounter: Payer: Self-pay | Admitting: Internal Medicine

## 2011-09-23 ENCOUNTER — Encounter: Payer: Medicare Other | Admitting: *Deleted

## 2011-11-11 ENCOUNTER — Ambulatory Visit (INDEPENDENT_AMBULATORY_CARE_PROVIDER_SITE_OTHER): Payer: Medicare Other | Admitting: *Deleted

## 2011-11-11 ENCOUNTER — Encounter: Payer: Self-pay | Admitting: Internal Medicine

## 2011-11-11 DIAGNOSIS — I5022 Chronic systolic (congestive) heart failure: Secondary | ICD-10-CM

## 2011-11-11 DIAGNOSIS — Z95810 Presence of automatic (implantable) cardiac defibrillator: Secondary | ICD-10-CM

## 2011-11-11 LAB — REMOTE ICD DEVICE
BAMS-0001: 150 {beats}/min
BAMS-0003: 70 {beats}/min
RV LEAD AMPLITUDE: 12 mv

## 2011-11-24 NOTE — Progress Notes (Signed)
ICD remote 

## 2011-12-08 ENCOUNTER — Encounter: Payer: Self-pay | Admitting: *Deleted

## 2011-12-08 ENCOUNTER — Encounter: Payer: Self-pay | Admitting: Internal Medicine

## 2011-12-08 ENCOUNTER — Ambulatory Visit (INDEPENDENT_AMBULATORY_CARE_PROVIDER_SITE_OTHER): Payer: Medicare Other | Admitting: *Deleted

## 2011-12-08 DIAGNOSIS — I428 Other cardiomyopathies: Secondary | ICD-10-CM

## 2011-12-08 DIAGNOSIS — I5022 Chronic systolic (congestive) heart failure: Secondary | ICD-10-CM

## 2011-12-08 LAB — ICD DEVICE OBSERVATION
ATRIAL PACING ICD: 96 pct
BAMS-0003: 70 {beats}/min
DEVICE MODEL ICD: 607910
FVT: 0
LV LEAD IMPEDENCE ICD: 687.5 Ohm
RV LEAD AMPLITUDE: 12 mv
RV LEAD IMPEDENCE ICD: 550 Ohm
TOT-0008: 0
TOT-0009: 0
VF: 0

## 2011-12-08 NOTE — Progress Notes (Signed)
ICD check with CorVue 

## 2012-03-08 ENCOUNTER — Ambulatory Visit (INDEPENDENT_AMBULATORY_CARE_PROVIDER_SITE_OTHER): Payer: Medicare Other | Admitting: Cardiology

## 2012-03-08 ENCOUNTER — Encounter: Payer: Self-pay | Admitting: Internal Medicine

## 2012-03-08 ENCOUNTER — Encounter: Payer: Self-pay | Admitting: Cardiology

## 2012-03-08 VITALS — BP 119/78 | HR 60 | Wt 158.0 lb

## 2012-03-08 DIAGNOSIS — I5022 Chronic systolic (congestive) heart failure: Secondary | ICD-10-CM

## 2012-03-08 DIAGNOSIS — Z95810 Presence of automatic (implantable) cardiac defibrillator: Secondary | ICD-10-CM

## 2012-03-08 DIAGNOSIS — I4891 Unspecified atrial fibrillation: Secondary | ICD-10-CM

## 2012-03-08 LAB — ICD DEVICE OBSERVATION
AL AMPLITUDE: 1 mv
ATRIAL PACING ICD: 99 pct
HV IMPEDENCE: 51 Ohm
LV LEAD THRESHOLD: 1 V
RV LEAD AMPLITUDE: 12 mv
VENTRICULAR PACING ICD: 99 pct

## 2012-03-08 NOTE — Patient Instructions (Signed)
Remote monitoring is used to monitor your Pacemaker of ICD from home. This monitoring reduces the number of office visits required to check your device to one time per year. It allows [place] to keep an eye on the functioning of your device to ensure it is working properly. You are scheduled for a device check from home on June 05, 2012. You may send your transmission at any time that day. If you have a wireless device, the transmission will be sent automatically. After your physician reviews your transmission, you will receive a postcard with your next transmission date.  Your physician wants you to follow-up in: 1 year with Dr Ladona Ridgel.  You will receive a reminder letter in the mail two months in advance. If you don't receive a letter, please call our office to schedule the follow-up appointment.

## 2012-03-08 NOTE — Progress Notes (Signed)
ELECTROPHYSIOLOGY OFFICE NOTE  Patient ID: BIRNEY BELSHE MRN: [id number], DOB/AGE: Mar 31, 1933   Date of Visit: 03/08/2012  Primary Physician: No primary provider on file. Primary Cardiologist: Verdis Prime, MD Reason for Visit: Device follow-up  History of Present Illness [name] is a pleasant 78 year old gentleman with a presumed nonischemic cardiomyopathy s/p BiV ICD implantation and atrial fibrillation who presents today for device follow-up. He reports he is doing well and has no complaints. He denies chest pain, shortness of breath, palpitations or syncope. He denies ICD shocks. He reports occasional dizziness with postural changes but states this has improved with slower movement when standing from a seated position. He denies LE swelling, orthopnea or PND. He reports compliance with his medications.  Past Medical History  Diagnosis Date  . Nonischemic cardiomyopathy s/p BiV ICD implantation Feb 2011   . Chronic systolic CHF   . Atrial fibrillation   . History of renal failure     Past Surgical History  Procedure Date  . Cardiac defibrillator placement   . Polypectomy     Allergies/Intolerances No Known Allergies  Current Home Medications Medication Sig Dispense Refill  . amiodarone (PACERONE) 200 MG tablet Take 200 mg by mouth daily.        . carvedilol (COREG) 6.25 MG tablet Take 6.25 mg by mouth 2 (two) times daily with a meal.        . colchicine 0.6 MG tablet Take 0.6 mg by mouth daily.        . furosemide (LASIX) 40 MG tablet Take 40 mg by mouth daily.       . isosorbide mononitrate (IMDUR) 60 MG 24 hr tablet Take 60 mg by mouth daily.        . potassium chloride SA (K-DUR,KLOR-CON) 20 MEQ tablet Take 20 mEq by mouth daily.        . simvastatin (ZOCOR) 40 MG tablet Take 20 mg by mouth at bedtime.       [name] spironolactone (ALDACTONE) 25 MG tablet Take 25 mg by mouth daily.        [name] warfarin (COUMADIN) 3 MG tablet Take 3 mg by mouth as directed.         Social  History Social History  . Marital Status: Married    Number of Children: 2   Occupational History  . retired Environmental manager    Social History Main Topics  . Smoking status: Never Smoker   . Smokeless tobacco: No  . Alcohol Use: No  . Drug Use: No   Review of Systems General:  No chills, fever, night sweats or weight changes Cardiovascular:  No chest pain, dyspnea on exertion, edema, orthopnea, palpitations, paroxysmal nocturnal dyspnea Dermatological: No rash, lesions or masses Respiratory: No cough, dyspnea Urologic: No hematuria, dysuria Abdominal:   No nausea, vomiting, diarrhea, bright red blood per rectum, melena, or hematemesis Neurologic:  No visual changes, weakness, changes in mental status All other systems reviewed and are otherwise negative except as noted above.  Physical Exam Vitals: Blood pressure 119/78, pulse 60, weight 158 lb (71.668 kg).  General: Well developed, well appearing 78 year old male in no acute distress. HEENT: Normocephalic, atraumatic. EOMs intact. Sclera nonicteric. Oropharynx clear.  Neck: Supple. No JVD. Lungs: Respirations regular and unlabored, CTA bilaterally. No wheezes, rales or rhonchi. Heart: RRR. S1, S2 present. No murmurs, rub, S3 or S4. Abdomen: Soft, non-distended.  Extremities: No clubbing, cyanosis or edema. DP/PT/Radials 2+ and equal bilaterally. Psych: Normal affect. Neuro: Alert  and oriented X 3. Moves all extremities spontaneously.   Diagnostics Device interrogation today shows normal BiV ICD function with good battery status and stable lead parameters/measurements; no episodes; no programming changes made; see PaceArt report  Assessment and Plan 1. Nonischemic cardiomyopathy s/p BiV ICD - device function is normal; no programming changes made; continue remote checks every 3 months; follow-up with Dr. Ladona Ridgel in one year 2. Atrial fibrillation - in sinus rhythm; continue amiodarone and beta blocker; continue warfarin for  embolic prophylaxis 3. Chronic systolic CHF - patient euvolemic by exam today; continue medical therapy with carvedilol and spironolactone; no ACEI/ARB due to h/o renal failure; keep scheduled follow-up with primary cardiologist  Signed, Rick Duff, PA-C 03/08/2012, 3:26 PM

## 2012-03-10 ENCOUNTER — Encounter: Payer: Medicare Other | Admitting: Internal Medicine

## 2012-06-05 ENCOUNTER — Encounter: Payer: Medicare Other | Admitting: *Deleted

## 2012-06-08 ENCOUNTER — Encounter: Payer: Self-pay | Admitting: *Deleted

## 2012-06-16 ENCOUNTER — Encounter: Payer: Self-pay | Admitting: Internal Medicine

## 2012-06-16 ENCOUNTER — Ambulatory Visit (INDEPENDENT_AMBULATORY_CARE_PROVIDER_SITE_OTHER): Payer: Medicare Other | Admitting: *Deleted

## 2012-06-16 ENCOUNTER — Encounter: Payer: Self-pay | Admitting: *Deleted

## 2012-06-16 DIAGNOSIS — I5022 Chronic systolic (congestive) heart failure: Secondary | ICD-10-CM

## 2012-06-16 DIAGNOSIS — Z95810 Presence of automatic (implantable) cardiac defibrillator: Secondary | ICD-10-CM

## 2012-06-16 LAB — REMOTE ICD DEVICE
ATRIAL PACING ICD: 100 pct
BAMS-0003: 70 {beats}/min
DEVICE MODEL ICD: 607910
RV LEAD AMPLITUDE: 11.7 mv
RV LEAD THRESHOLD: 0.5 V
VENTRICULAR PACING ICD: 100 pct

## 2012-06-21 ENCOUNTER — Encounter: Payer: Self-pay | Admitting: *Deleted

## 2012-09-25 ENCOUNTER — Ambulatory Visit (INDEPENDENT_AMBULATORY_CARE_PROVIDER_SITE_OTHER): Payer: Medicare Other | Admitting: *Deleted

## 2012-09-25 ENCOUNTER — Encounter: Payer: Self-pay | Admitting: Internal Medicine

## 2012-09-25 DIAGNOSIS — Z95810 Presence of automatic (implantable) cardiac defibrillator: Secondary | ICD-10-CM

## 2012-09-25 DIAGNOSIS — I5022 Chronic systolic (congestive) heart failure: Secondary | ICD-10-CM

## 2012-09-27 LAB — REMOTE ICD DEVICE
AL IMPEDENCE ICD: 360 Ohm
BRDY-0002RV: 60 {beats}/min
BRDY-0003RV: 120 {beats}/min
BRDY-0004RV: 120 {beats}/min
HV IMPEDENCE: 49 Ohm
RV LEAD IMPEDENCE ICD: 600 Ohm
RV LEAD THRESHOLD: 0.5 V

## 2012-10-12 ENCOUNTER — Encounter: Payer: Self-pay | Admitting: *Deleted

## 2012-12-25 ENCOUNTER — Ambulatory Visit (INDEPENDENT_AMBULATORY_CARE_PROVIDER_SITE_OTHER): Payer: Medicare Other | Admitting: *Deleted

## 2012-12-25 ENCOUNTER — Other Ambulatory Visit: Payer: Self-pay | Admitting: Internal Medicine

## 2012-12-25 DIAGNOSIS — I5022 Chronic systolic (congestive) heart failure: Secondary | ICD-10-CM

## 2012-12-25 DIAGNOSIS — Z95810 Presence of automatic (implantable) cardiac defibrillator: Secondary | ICD-10-CM

## 2012-12-26 LAB — REMOTE ICD DEVICE
AL AMPLITUDE: 1.7 mv
ATRIAL PACING ICD: 98 pct
BAMS-0001: 150 {beats}/min
BAMS-0003: 70 {beats}/min
BRDY-0002RV: 60 {beats}/min
HV IMPEDENCE: 51 Ohm
RV LEAD AMPLITUDE: 12 mv
RV LEAD IMPEDENCE ICD: 610 Ohm
VENTRICULAR PACING ICD: 100 pct

## 2013-01-09 ENCOUNTER — Encounter: Payer: Self-pay | Admitting: *Deleted

## 2013-01-11 ENCOUNTER — Encounter: Payer: Self-pay | Admitting: Internal Medicine

## 2013-03-13 ENCOUNTER — Encounter: Payer: Self-pay | Admitting: Internal Medicine

## 2013-03-13 ENCOUNTER — Ambulatory Visit (INDEPENDENT_AMBULATORY_CARE_PROVIDER_SITE_OTHER): Payer: Medicare Other | Admitting: Internal Medicine

## 2013-03-13 VITALS — BP 113/77 | HR 67 | Ht 68.0 in | Wt 157.1 lb

## 2013-03-13 DIAGNOSIS — I5022 Chronic systolic (congestive) heart failure: Secondary | ICD-10-CM

## 2013-03-13 DIAGNOSIS — I4891 Unspecified atrial fibrillation: Secondary | ICD-10-CM

## 2013-03-13 DIAGNOSIS — Z95810 Presence of automatic (implantable) cardiac defibrillator: Secondary | ICD-10-CM

## 2013-03-13 LAB — ICD DEVICE OBSERVATION
AL IMPEDENCE ICD: 362.5 Ohm
AL THRESHOLD: 1 V
ATRIAL PACING ICD: 98 pct
BAMS-0003: 70 {beats}/min
HV IMPEDENCE: 50 Ohm
PACEART VT: 0
RV LEAD IMPEDENCE ICD: 612.5 Ohm
RV LEAD THRESHOLD: 0.5 V
TOT-0006: 20111118000000
TOT-0007: 1
TOT-0009: 0
TOT-0010: 12

## 2013-03-13 NOTE — Patient Instructions (Addendum)
Your physician wants you to follow-up in: 12 months with Dr Taylor You will receive a reminder letter in the mail two months in advance. If you don't receive a letter, please call our office to schedule the follow-up appointment.  Remote monitoring is used to monitor your Pacemaker of ICD from home. This monitoring reduces the number of office visits required to check your device to one time per year. It allows us to keep an eye on the functioning of your device to ensure it is working properly. You are scheduled for a device check from home on 06/18/13. You may send your transmission at any time that day. If you have a wireless device, the transmission will be sent automatically. After your physician reviews your transmission, you will receive a postcard with your next transmission date.    

## 2013-03-14 ENCOUNTER — Encounter: Payer: Self-pay | Admitting: Internal Medicine

## 2013-03-14 NOTE — Assessment & Plan Note (Signed)
Interrogation of his ICD demonstrates that he is maintaining sinus rhythm very nicely. We will continue low-dose amiodarone along with warfarin.

## 2013-03-14 NOTE — Progress Notes (Signed)
HPI Mr. Reckart returns today for followup. He is a very pleasant 79 year old man with a nonischemic cardiomyopathy, chronic systolic heart failure, left bundle branch block, status post biventricular ICD implantation, and atrial fibrillation. He has done well in the interim. He denies chest pain, shortness of breath, syncope, or any ICD shock. No peripheral edema. No Known Allergies   Current Outpatient Prescriptions  Medication Sig Dispense Refill  . amiodarone (PACERONE) 200 MG tablet Take 200 mg by mouth daily.        . carvedilol (COREG) 6.25 MG tablet Take 6.25 mg by mouth 2 (two) times daily with a meal.        . colchicine 0.6 MG tablet Take 0.6 mg by mouth daily.        . furosemide (LASIX) 40 MG tablet Take 40 mg by mouth daily.       . isosorbide mononitrate (IMDUR) 60 MG 24 hr tablet Take 60 mg by mouth daily.        . potassium chloride SA (K-DUR,KLOR-CON) 20 MEQ tablet Take 20 mEq by mouth daily.        . simvastatin (ZOCOR) 40 MG tablet Take 20 mg by mouth at bedtime.       [name] spironolactone (ALDACTONE) 25 MG tablet Take 25 mg by mouth daily.        [name] warfarin (COUMADIN) 3 MG tablet Take 3 mg by mouth as directed.         No current facility-administered medications for this visit.     Past Medical History  Diagnosis Date  . Renal failure     acute  . Atrial fibrillation     ROS:   All systems reviewed and negative except as noted in the HPI.   Past Surgical History  Procedure Laterality Date  . Cardiac defibrillator placement    . Polypectomy       Family History  Problem Relation Age of Onset  . Other      notable for CHF     History   Social History  . Marital Status: Married    Spouse Name: N/A    Number of Children: 2  . Years of Education: N/A   Occupational History  . retired Environmental manager    Social History Main Topics  . Smoking status: Never Smoker   . Smokeless tobacco: Not on file  . Alcohol Use: No  . Drug Use: No  . Sexually  Active: Not on file   Other Topics Concern  . Not on file   Social History Narrative  . No narrative on file     BP 113/77  Pulse 67  Ht 5\' 8"  (1.727 m)  Wt 157 lb 1.9 oz (71.269 kg)  BMI 23.9 kg/m2  Physical Exam:  Well appearing 79 year old man, NAD HEENT: Unremarkable Neck:  7 cm JVD, no thyromegally Back:  No CVA tenderness Lungs:  Clear with no wheezes, rales, or rhonchi. HEART:  Regular rate rhythm, no murmurs, no rubs, no clicks Abd:  soft, positive bowel sounds, no organomegally, no rebound, no guarding Ext:  2 plus pulses, no edema, no cyanosis, no clubbing Skin:  No rashes no nodules Neuro:  CN II through XII intact, motor grossly intact   DEVICE  Normal device function.  See PaceArt for details.   Assess/Plan:

## 2013-03-14 NOTE — Assessment & Plan Note (Signed)
His chronic systolic heart failure is currently class II. He will continue his current medical therapy, maintain a low-sodium diet, and continue physical activity.

## 2013-03-14 NOTE — Assessment & Plan Note (Signed)
His St. Jude ICD is working normally. We'll plan to recheck in several months. 

## 2013-06-18 ENCOUNTER — Encounter: Payer: Self-pay | Admitting: Internal Medicine

## 2013-06-18 ENCOUNTER — Ambulatory Visit (INDEPENDENT_AMBULATORY_CARE_PROVIDER_SITE_OTHER): Payer: Medicare Other | Admitting: *Deleted

## 2013-06-18 DIAGNOSIS — I5022 Chronic systolic (congestive) heart failure: Secondary | ICD-10-CM

## 2013-06-18 DIAGNOSIS — I4891 Unspecified atrial fibrillation: Secondary | ICD-10-CM

## 2013-06-18 DIAGNOSIS — Z95810 Presence of automatic (implantable) cardiac defibrillator: Secondary | ICD-10-CM

## 2013-06-20 LAB — REMOTE ICD DEVICE
AL IMPEDENCE ICD: 340 Ohm
BRDY-0002RV: 60 {beats}/min
BRDY-0003RV: 120 {beats}/min
DEVICE MODEL ICD: 607910
HV IMPEDENCE: 48 Ohm
RV LEAD IMPEDENCE ICD: 600 Ohm
VENTRICULAR PACING ICD: 97 pct

## 2013-06-26 ENCOUNTER — Encounter: Payer: Self-pay | Admitting: *Deleted

## 2013-06-26 ENCOUNTER — Ambulatory Visit (INDEPENDENT_AMBULATORY_CARE_PROVIDER_SITE_OTHER): Payer: Medicare Other | Admitting: Pharmacist

## 2013-06-26 DIAGNOSIS — I4891 Unspecified atrial fibrillation: Secondary | ICD-10-CM

## 2013-07-10 ENCOUNTER — Ambulatory Visit (INDEPENDENT_AMBULATORY_CARE_PROVIDER_SITE_OTHER): Payer: Medicare Other | Admitting: Pharmacist

## 2013-07-10 DIAGNOSIS — I4891 Unspecified atrial fibrillation: Secondary | ICD-10-CM

## 2013-07-25 ENCOUNTER — Encounter: Payer: Self-pay | Admitting: Interventional Cardiology

## 2013-07-26 ENCOUNTER — Other Ambulatory Visit: Payer: Self-pay | Admitting: Pharmacist

## 2013-07-26 MED ORDER — WARFARIN SODIUM 3 MG PO TABS: ORAL_TABLET | ORAL | Status: DC

## 2013-08-07 ENCOUNTER — Ambulatory Visit (INDEPENDENT_AMBULATORY_CARE_PROVIDER_SITE_OTHER): Payer: Medicare Other | Admitting: Pharmacist

## 2013-08-07 DIAGNOSIS — I4891 Unspecified atrial fibrillation: Secondary | ICD-10-CM

## 2013-08-07 LAB — POCT INR: INR: 2.3

## 2013-08-15 ENCOUNTER — Telehealth: Payer: Self-pay | Admitting: Interventional Cardiology

## 2013-08-15 DIAGNOSIS — I5022 Chronic systolic (congestive) heart failure: Secondary | ICD-10-CM

## 2013-08-15 NOTE — Telephone Encounter (Signed)
pt adv that he should have not stopped  spironalactone 25mg  and to resume taking. pt sts that he has some on hand and will start back today.adv pt that he needs to come in to the office for labs in 1 week 08/22/13. pt verbalized understanding.

## 2013-08-15 NOTE — Telephone Encounter (Signed)
New Message   Pt called wants to know if he has been instructed to stop taking Spironolactone 25 mg// Please call back to discuss.

## 2013-08-22 ENCOUNTER — Other Ambulatory Visit (INDEPENDENT_AMBULATORY_CARE_PROVIDER_SITE_OTHER): Payer: Medicare Other

## 2013-08-22 DIAGNOSIS — I5022 Chronic systolic (congestive) heart failure: Secondary | ICD-10-CM

## 2013-08-22 LAB — BASIC METABOLIC PANEL
CO2: 23 mEq/L (ref 19–32)
Chloride: 107 mEq/L (ref 96–112)
Glucose, Bld: 93 mg/dL (ref 70–99)
Potassium: 4.8 mEq/L (ref 3.5–5.1)
Sodium: 141 mEq/L (ref 135–145)

## 2013-08-27 ENCOUNTER — Telehealth: Payer: Self-pay

## 2013-08-27 NOTE — Telephone Encounter (Signed)
Message copied by Jarvis Newcomer on Mon Aug 27, 2013  8:31 AM ------      Message from: Verdis Prime      Created: Thu Aug 23, 2013  6:04 PM       Labs are stable with the exception of kidney function worsening slightly. ------

## 2013-08-27 NOTE — Telephone Encounter (Signed)
pt aware of lab results.Labs are stable with the exception of kidney function worsening slightly.pt verbalized understanding.

## 2013-08-27 NOTE — Telephone Encounter (Signed)
Message copied by Jarvis Newcomer on Mon Aug 27, 2013  8:20 AM ------      Message from: Verdis Prime      Created: Thu Aug 23, 2013  6:04 PM       Labs are stable with the exception of kidney function worsening slightly. ------

## 2013-09-11 ENCOUNTER — Ambulatory Visit (INDEPENDENT_AMBULATORY_CARE_PROVIDER_SITE_OTHER): Payer: Medicare Other

## 2013-09-11 DIAGNOSIS — I4891 Unspecified atrial fibrillation: Secondary | ICD-10-CM

## 2013-09-20 ENCOUNTER — Ambulatory Visit (INDEPENDENT_AMBULATORY_CARE_PROVIDER_SITE_OTHER): Payer: Medicare HMO | Admitting: *Deleted

## 2013-09-20 ENCOUNTER — Encounter: Payer: Self-pay | Admitting: Internal Medicine

## 2013-09-20 DIAGNOSIS — I5022 Chronic systolic (congestive) heart failure: Secondary | ICD-10-CM

## 2013-09-20 DIAGNOSIS — I4891 Unspecified atrial fibrillation: Secondary | ICD-10-CM

## 2013-09-20 DIAGNOSIS — I428 Other cardiomyopathies: Secondary | ICD-10-CM

## 2013-09-23 LAB — MDC_IDC_ENUM_SESS_TYPE_REMOTE
Battery Remaining Percentage: 48 %
Battery Voltage: 2.92 V
Brady Statistic AS VP Percent: 1.2 %
Date Time Interrogation Session: 20150108105858
HIGH POWER IMPEDANCE MEASURED VALUE: 58 Ohm
Implantable Pulse Generator Serial Number: 607910
Lead Channel Impedance Value: 360 Ohm
Lead Channel Pacing Threshold Amplitude: 1.25 V
Lead Channel Sensing Intrinsic Amplitude: 11.7 mV
Lead Channel Sensing Intrinsic Amplitude: 5 mV
Lead Channel Setting Pacing Amplitude: 2 V
Lead Channel Setting Pacing Amplitude: 2.5 V
Lead Channel Setting Pacing Pulse Width: 0.5 ms
MDC IDC MSMT BATTERY REMAINING LONGEVITY: 40 mo
MDC IDC MSMT LEADCHNL LV IMPEDANCE VALUE: 700 Ohm
MDC IDC MSMT LEADCHNL LV PACING THRESHOLD PULSEWIDTH: 0.8 ms
MDC IDC MSMT LEADCHNL RV IMPEDANCE VALUE: 690 Ohm
MDC IDC MSMT LEADCHNL RV PACING THRESHOLD AMPLITUDE: 0.625 V
MDC IDC MSMT LEADCHNL RV PACING THRESHOLD PULSEWIDTH: 0.5 ms
MDC IDC SET LEADCHNL LV PACING PULSEWIDTH: 0.8 ms
MDC IDC SET LEADCHNL RA PACING AMPLITUDE: 2 V
MDC IDC SET LEADCHNL RV SENSING SENSITIVITY: 0.5 mV
MDC IDC STAT BRADY AP VP PERCENT: 97 %
MDC IDC STAT BRADY AP VS PERCENT: 1.4 %
MDC IDC STAT BRADY AS VS PERCENT: 1 %
MDC IDC STAT BRADY RA PERCENT PACED: 98 %
Zone Setting Detection Interval: 330 ms

## 2013-10-09 ENCOUNTER — Ambulatory Visit (INDEPENDENT_AMBULATORY_CARE_PROVIDER_SITE_OTHER): Payer: Medicare HMO | Admitting: *Deleted

## 2013-10-09 ENCOUNTER — Encounter: Payer: Self-pay | Admitting: *Deleted

## 2013-10-09 DIAGNOSIS — I4891 Unspecified atrial fibrillation: Secondary | ICD-10-CM

## 2013-10-09 LAB — POCT INR: INR: 2.7

## 2013-10-27 ENCOUNTER — Encounter: Payer: Self-pay | Admitting: Interventional Cardiology

## 2013-10-27 ENCOUNTER — Encounter: Payer: Self-pay | Admitting: *Deleted

## 2013-10-29 ENCOUNTER — Ambulatory Visit
Admission: RE | Admit: 2013-10-29 | Discharge: 2013-10-29 | Disposition: A | Discharge status: Home / Self Care | Payer: Medicare (Managed Care) | Source: Ambulatory Visit | Attending: Interventional Cardiology | Admitting: Interventional Cardiology

## 2013-10-29 ENCOUNTER — Other Ambulatory Visit: Payer: Medicare HMO

## 2013-10-29 ENCOUNTER — Ambulatory Visit (INDEPENDENT_AMBULATORY_CARE_PROVIDER_SITE_OTHER): Payer: Medicare HMO | Admitting: Interventional Cardiology

## 2013-10-29 ENCOUNTER — Encounter: Payer: Self-pay | Admitting: Interventional Cardiology

## 2013-10-29 ENCOUNTER — Encounter (INDEPENDENT_AMBULATORY_CARE_PROVIDER_SITE_OTHER): Payer: Medicare HMO | Admitting: *Deleted

## 2013-10-29 VITALS — BP 109/65 | HR 60 | Ht 68.0 in | Wt 156.0 lb

## 2013-10-29 DIAGNOSIS — Z95810 Presence of automatic (implantable) cardiac defibrillator: Secondary | ICD-10-CM

## 2013-10-29 DIAGNOSIS — I4891 Unspecified atrial fibrillation: Secondary | ICD-10-CM

## 2013-10-29 DIAGNOSIS — I5022 Chronic systolic (congestive) heart failure: Secondary | ICD-10-CM

## 2013-10-29 DIAGNOSIS — Z79899 Other long term (current) drug therapy: Secondary | ICD-10-CM

## 2013-10-29 LAB — HEPATIC FUNCTION PANEL
ALBUMIN: 3 g/dL — AB (ref 3.5–5.2)
ALK PHOS: 39 U/L (ref 39–117)
ALT: 49 U/L (ref 0–53)
AST: 61 U/L — AB (ref 0–37)
Bilirubin, Direct: 0.2 mg/dL (ref 0.0–0.3)
TOTAL PROTEIN: 6.9 g/dL (ref 6.0–8.3)
Total Bilirubin: 1.1 mg/dL (ref 0.3–1.2)

## 2013-10-29 LAB — TSH: TSH: 2.27 u[IU]/mL (ref 0.35–5.50)

## 2013-10-29 NOTE — Patient Instructions (Signed)
Your physician recommends that you continue on your current medications as directed. Please refer to the Current Medication list given to you today.  Labs today: Tsh, Hepatic   A chest x-ray takes a picture of the organs and structures inside the chest, including the heart, lungs, and blood vessels. This test can show several things, including, whether the heart is enlarges; whether fluid is building up in the lungs; and whether pacemaker / defibrillator leads are still in place. ( To be done at [facility]. You can walk in you do not need an appt)  Your physician wants you to follow-up in: 6 months You will receive a reminder letter in the mail two months in advance. If you don't receive a letter, please call our office to schedule the follow-up appointment.  Your physician recommends that you return for lab work in: 6 months Tsh, Hepatic, Chest Xray

## 2013-10-29 NOTE — Progress Notes (Signed)
Patient ID: Tyler Gray, male   DOB: 06-Dec-1932, 80 y.o.   MRN: [id number] Past Medical History  Presumed Non-ischemic cardiomypathy with LVEF 25-35 % BIV AICD implant 10/2009   Neprolithiasis, 2009s   Paroxysmal atrial fibrillation   Chronic systolc heart failure   Hyperlipidemai   Chronic coumadin therapy for atrial fibrillation embolic risk      [address] [place], [place]  [id number] Phone: [phone] Fax:  [phone]  Date:  10/29/2013   ID:  Tyler Gray, DOB 06-Dec-1932, MRN [id number]  PCP:  No primary provider on file.   ASSESSMENT:  1. chronic systolic heart failure, without significant symptoms 2. Chronic kidney disease with creatinine of 2.2 in December 3. Chronic anticoagulation therapy, currently followed in Coumadin clinic 4. Chronic amiodarone therapy 4. History of paroxysmal atrial fibrillation 5. AICD therapy  PLAN:  1. Continue to adhere to a low-salt volume restricted diet 2. Clinical followup in 6 months for heart failure reevaluation 3. Continue to follow in the device clinic 4. PA lateral chest x-ray, TSH, and hepatic panel today and in 6 months on return visit   SUBJECTIVE: Tyler Gray is a 80 y.o. male who is here without complaints. He has a nonischemic cardiomyopathy. There is a history of paroxysmal atrial fibrillation. He has chronic kidney disease. Creatinine last evaluated in December 2014 was 2.2. No episodes of syncope. No bleeding on Coumadin therapy.   Wt Readings from Last 3 Encounters:  10/29/13 156 lb (70.761 kg)  03/13/13 157 lb 1.9 oz (71.269 kg)  03/08/12 158 lb (71.668 kg)     Past Medical History  Diagnosis Date  . Renal failure     acute  . Atrial fibrillation   . Chronic systolic heart failure   . ICD-St.Jude     Current Outpatient Prescriptions  Medication Sig Dispense Refill  . amiodarone (PACERONE) 200 MG tablet Take 200 mg by mouth daily.        . carvedilol (COREG) 6.25 MG tablet  Take 6.25 mg by mouth 2 (two) times daily with a meal.        . colchicine 0.6 MG tablet Take 0.6 mg by mouth daily.        . furosemide (LASIX) 40 MG tablet Take 40 mg by mouth daily.       . isosorbide mononitrate (IMDUR) 60 MG 24 hr tablet Take 60 mg by mouth daily.        . potassium chloride SA (K-DUR,KLOR-CON) 20 MEQ tablet Take 20 mEq by mouth daily.        . simvastatin (ZOCOR) 40 MG tablet Take 20 mg by mouth at bedtime.       [name]. spironolactone (ALDACTONE) 25 MG tablet Take 25 mg by mouth daily.        [name]. warfarin (COUMADIN) 3 MG tablet Take 1 tablet on all days except 1/2 tablet on Tuesday and Sunday, or as directed by Coumadin Clinic.  90 tablet  1   No current facility-administered medications for this visit.    Allergies:   No Known Allergies  Social History:  The patient  reports that he has never smoked. He does not have any smokeless tobacco history on file. He reports that he does not drink alcohol or use illicit drugs.   ROS:  Please see the history of present illness.   Having occasional gout flares. Having difficulty sleeping through the night. In some is not related to dyspnea or other medical  problems but an inability to stay asleep. He has had no neurological complaints. No tachycardia or palpitations.   All other systems reviewed and negative.   OBJECTIVE: VS:  BP 109/65  Pulse 60  Ht 5\' 8"  (1.727 m)  Wt 156 lb (70.761 kg)  BMI 23.73 kg/m2 Well nourished, well developed, in no acute distress, elderly and appears older than stated age HEENT: normal Neck: JVD moderate dilatation at 90. Carotid bruit absent  Cardiac:  normal S1, S2; RRR; no murmur Lungs:  clear to auscultation bilaterally, no wheezing, rhonchi or rales Abd: soft, nontender, no hepatomegaly Ext: Edema  Trace bilateral. Evidence of dyshidrotic eczema on both lower extremities and arms . Pulses Trace to 1+ bilateral  Skin: warm and dry Neuro:  CNs 2-12 intact, no focal abnormalities noted  EKG:  Not  repeated       Signed, Darci Needle III, MD 10/29/2013 11:20 AM

## 2013-10-30 ENCOUNTER — Ambulatory Visit: Payer: Medicare Other | Admitting: Interventional Cardiology

## 2013-10-31 ENCOUNTER — Telehealth: Payer: Self-pay

## 2013-10-31 NOTE — Telephone Encounter (Signed)
Message copied by Jarvis Newcomer on Wed Oct 31, 2013  4:32 PM ------      Message from: Verdis Prime      Created: Mon Oct 29, 2013  2:04 PM       No acute abnormality noted. ------

## 2013-10-31 NOTE — Telephone Encounter (Signed)
pt given lab results.Labs are normal.pt given chest xray results.No acute abnormality noted.repeat both in 14mo.pt verbalized understanding.

## 2013-11-12 ENCOUNTER — Other Ambulatory Visit: Payer: Self-pay

## 2013-11-12 MED ORDER — FUROSEMIDE 40 MG PO TABS: 40.0000 mg | ORAL_TABLET | Freq: Every day | ORAL | Status: DC

## 2013-11-12 MED ORDER — CARVEDILOL 6.25 MG PO TABS: 6.2500 mg | ORAL_TABLET | Freq: Two times a day (BID) | ORAL | Status: DC

## 2013-11-12 MED ORDER — ISOSORBIDE MONONITRATE ER 60 MG PO TB24: 60.0000 mg | ORAL_TABLET | Freq: Every day | ORAL | Status: DC

## 2013-11-12 MED ORDER — AMIODARONE HCL 200 MG PO TABS: 200.0000 mg | ORAL_TABLET | Freq: Every day | ORAL | Status: DC

## 2013-11-12 MED ORDER — POTASSIUM CHLORIDE CRYS ER 20 MEQ PO TBCR: 20.0000 meq | EXTENDED_RELEASE_TABLET | Freq: Every day | ORAL | Status: DC

## 2013-11-12 MED ORDER — SIMVASTATIN 40 MG PO TABS: 20.0000 mg | ORAL_TABLET | Freq: Every day | ORAL | Status: DC

## 2013-11-12 MED ORDER — SPIRONOLACTONE 25 MG PO TABS: 25.0000 mg | ORAL_TABLET | Freq: Every day | ORAL | Status: DC

## 2013-12-24 ENCOUNTER — Ambulatory Visit (INDEPENDENT_AMBULATORY_CARE_PROVIDER_SITE_OTHER): Payer: Medicare HMO | Admitting: *Deleted

## 2013-12-24 DIAGNOSIS — I4891 Unspecified atrial fibrillation: Secondary | ICD-10-CM

## 2013-12-24 DIAGNOSIS — I5022 Chronic systolic (congestive) heart failure: Secondary | ICD-10-CM

## 2013-12-24 LAB — MDC_IDC_ENUM_SESS_TYPE_REMOTE
Battery Voltage: 2.9 V
Brady Statistic AP VS Percent: 1 %
Brady Statistic AS VP Percent: 1 %
Brady Statistic AS VS Percent: 1 %
Date Time Interrogation Session: 20150413073229
HighPow Impedance: 52 Ohm
Lead Channel Impedance Value: 360 Ohm
Lead Channel Impedance Value: 660 Ohm
Lead Channel Pacing Threshold Amplitude: 0.5 V
Lead Channel Pacing Threshold Amplitude: 1.25 V
Lead Channel Pacing Threshold Pulse Width: 0.5 ms
Lead Channel Pacing Threshold Pulse Width: 0.5 ms
Lead Channel Pacing Threshold Pulse Width: 0.8 ms
Lead Channel Sensing Intrinsic Amplitude: 12 mV
Lead Channel Setting Pacing Amplitude: 2 V
Lead Channel Setting Pacing Amplitude: 2 V
Lead Channel Setting Pacing Amplitude: 2.5 V
Lead Channel Setting Pacing Pulse Width: 0.5 ms
Lead Channel Setting Pacing Pulse Width: 0.8 ms
MDC IDC MSMT BATTERY REMAINING LONGEVITY: 36 mo
MDC IDC MSMT BATTERY REMAINING PERCENTAGE: 45 %
MDC IDC MSMT LEADCHNL RA PACING THRESHOLD AMPLITUDE: 1 V
MDC IDC MSMT LEADCHNL RA SENSING INTR AMPL: 1.8 mV
MDC IDC MSMT LEADCHNL RV IMPEDANCE VALUE: 650 Ohm
MDC IDC PG SERIAL: 607910
MDC IDC SET LEADCHNL RV SENSING SENSITIVITY: 0.5 mV
MDC IDC SET ZONE DETECTION INTERVAL: 330 ms
MDC IDC STAT BRADY AP VP PERCENT: 98 %
MDC IDC STAT BRADY RA PERCENT PACED: 99 %

## 2014-01-04 ENCOUNTER — Telehealth: Payer: Self-pay | Admitting: *Deleted

## 2014-01-04 MED ORDER — WARFARIN SODIUM 3 MG PO TABS: ORAL_TABLET | ORAL | Status: DC

## 2014-01-04 NOTE — Telephone Encounter (Signed)
Checked [facility] and Dr. Katrinka Blazing has never refilled medication that I can see.

## 2014-01-04 NOTE — Telephone Encounter (Signed)
Patient requests coumadin refill. He would like a local few day supply sent to [facility] and a 90 day supply sent to [facility]. Thanks, MI

## 2014-01-04 NOTE — Telephone Encounter (Signed)
Patient is delinquent with coumadin clinic visit, last visit in January 2015

## 2014-01-04 NOTE — Progress Notes (Signed)
This encounter was created in error - please disregard.

## 2014-01-04 NOTE — Telephone Encounter (Signed)
Patient aware that Dr Katrinka Blazing has never filled this for him and he will call Dr Petra Kuba on Monday.

## 2014-01-04 NOTE — Telephone Encounter (Signed)
Patient requests colchicine refill be sent to [facility]. Does Dr Katrinka Blazing normally refill this for the patient? Please advise. Thanks, MI

## 2014-01-04 NOTE — Telephone Encounter (Signed)
Amy, can you please look in the [facility] chart and see if Dr Katrinka Blazing refills colchicine for this patient as he is out and needs a 14 day supply sent to his local pharmacy? Thanks, MI

## 2014-01-04 NOTE — Telephone Encounter (Signed)
Dr Katrinka Blazing, do you fill colchicine for this patient? Thanks, MI

## 2014-01-07 ENCOUNTER — Ambulatory Visit (INDEPENDENT_AMBULATORY_CARE_PROVIDER_SITE_OTHER): Payer: Medicare HMO

## 2014-01-07 DIAGNOSIS — I4891 Unspecified atrial fibrillation: Secondary | ICD-10-CM

## 2014-01-07 LAB — POCT INR: INR: 2

## 2014-01-07 MED ORDER — WARFARIN SODIUM 3 MG PO TABS: ORAL_TABLET | ORAL | Status: DC

## 2014-01-08 ENCOUNTER — Encounter: Payer: Self-pay | Admitting: *Deleted

## 2014-01-09 ENCOUNTER — Encounter: Payer: Self-pay | Admitting: Internal Medicine

## 2014-02-05 ENCOUNTER — Ambulatory Visit (INDEPENDENT_AMBULATORY_CARE_PROVIDER_SITE_OTHER): Payer: Medicare HMO | Admitting: *Deleted

## 2014-02-05 DIAGNOSIS — I4891 Unspecified atrial fibrillation: Secondary | ICD-10-CM

## 2014-02-05 LAB — POCT INR: INR: 1.5

## 2014-02-06 ENCOUNTER — Encounter: Payer: Self-pay | Admitting: Internal Medicine

## 2014-02-11 ENCOUNTER — Encounter: Payer: Self-pay | Admitting: Internal Medicine

## 2014-02-11 NOTE — Progress Notes (Signed)
This encounter was created in error - please disregard.

## 2014-02-19 ENCOUNTER — Telehealth: Payer: Self-pay | Admitting: *Deleted

## 2014-02-19 ENCOUNTER — Ambulatory Visit (INDEPENDENT_AMBULATORY_CARE_PROVIDER_SITE_OTHER): Payer: Medicare HMO | Admitting: Pharmacist Clinician (PhC)/ Clinical Pharmacy Specialist

## 2014-02-19 DIAGNOSIS — I4891 Unspecified atrial fibrillation: Secondary | ICD-10-CM

## 2014-02-19 LAB — POCT INR: INR: 1.8

## 2014-02-19 NOTE — Telephone Encounter (Signed)
Per covermymeds there is not a PA needed for patients amiodarone

## 2014-02-20 ENCOUNTER — Other Ambulatory Visit: Payer: Self-pay | Admitting: *Deleted

## 2014-02-20 MED ORDER — SIMVASTATIN 20 MG PO TABS: 20.0000 mg | ORAL_TABLET | Freq: Every day | ORAL | Status: DC

## 2014-03-05 ENCOUNTER — Ambulatory Visit (INDEPENDENT_AMBULATORY_CARE_PROVIDER_SITE_OTHER): Payer: Medicare HMO | Admitting: Pharmacist

## 2014-03-05 DIAGNOSIS — I4891 Unspecified atrial fibrillation: Secondary | ICD-10-CM

## 2014-03-05 LAB — POCT INR: INR: 2.6

## 2014-03-12 ENCOUNTER — Other Ambulatory Visit: Payer: Self-pay | Admitting: *Deleted

## 2014-03-12 MED ORDER — WARFARIN SODIUM 3 MG PO TABS: ORAL_TABLET | ORAL | Status: DC

## 2014-03-14 ENCOUNTER — Encounter: Payer: Medicare HMO | Admitting: Internal Medicine

## 2014-03-19 ENCOUNTER — Encounter: Payer: Self-pay | Admitting: Internal Medicine

## 2014-03-19 ENCOUNTER — Ambulatory Visit (INDEPENDENT_AMBULATORY_CARE_PROVIDER_SITE_OTHER): Payer: Medicare HMO | Admitting: Pharmacist

## 2014-03-19 ENCOUNTER — Ambulatory Visit (INDEPENDENT_AMBULATORY_CARE_PROVIDER_SITE_OTHER): Payer: Medicare HMO | Admitting: Internal Medicine

## 2014-03-19 VITALS — BP 115/77 | HR 60 | Ht 67.0 in | Wt 153.2 lb

## 2014-03-19 DIAGNOSIS — I4891 Unspecified atrial fibrillation: Secondary | ICD-10-CM

## 2014-03-19 DIAGNOSIS — I480 Paroxysmal atrial fibrillation: Secondary | ICD-10-CM

## 2014-03-19 DIAGNOSIS — Z95810 Presence of automatic (implantable) cardiac defibrillator: Secondary | ICD-10-CM

## 2014-03-19 DIAGNOSIS — I5022 Chronic systolic (congestive) heart failure: Secondary | ICD-10-CM

## 2014-03-19 LAB — MDC_IDC_ENUM_SESS_TYPE_INCLINIC
Brady Statistic RA Percent Paced: 98 %
Brady Statistic RV Percent Paced: 99 %
HighPow Impedance: 55 Ohm
Implantable Pulse Generator Serial Number: 607910
Lead Channel Impedance Value: 375 Ohm
Lead Channel Impedance Value: 687.5 Ohm
Lead Channel Impedance Value: 712.5 Ohm
Lead Channel Pacing Threshold Amplitude: 0.625 V
Lead Channel Pacing Threshold Amplitude: 1 V
Lead Channel Pacing Threshold Amplitude: 1.25 V
Lead Channel Pacing Threshold Amplitude: 1.25 V
Lead Channel Pacing Threshold Pulse Width: 0.5 ms
Lead Channel Pacing Threshold Pulse Width: 0.5 ms
Lead Channel Pacing Threshold Pulse Width: 0.8 ms
Lead Channel Sensing Intrinsic Amplitude: 12 mV
Lead Channel Setting Pacing Amplitude: 2 V
Lead Channel Setting Pacing Pulse Width: 0.5 ms
MDC IDC MSMT BATTERY REMAINING LONGEVITY: 33.6 mo
MDC IDC MSMT LEADCHNL LV PACING THRESHOLD PULSEWIDTH: 0.8 ms
MDC IDC MSMT LEADCHNL RA PACING THRESHOLD AMPLITUDE: 1 V
MDC IDC MSMT LEADCHNL RA PACING THRESHOLD PULSEWIDTH: 0.5 ms
MDC IDC MSMT LEADCHNL RA SENSING INTR AMPL: 0.7 mV
MDC IDC SESS DTM: 20150707130911
MDC IDC SET LEADCHNL LV PACING AMPLITUDE: 2.5 V
MDC IDC SET LEADCHNL LV PACING PULSEWIDTH: 0.8 ms
MDC IDC SET LEADCHNL RV PACING AMPLITUDE: 2 V
MDC IDC SET LEADCHNL RV SENSING SENSITIVITY: 0.5 mV
Zone Setting Detection Interval: 330 ms

## 2014-03-19 LAB — POCT INR: INR: 3

## 2014-03-19 NOTE — Progress Notes (Signed)
HPI Tyler Gray returns today for followup. He is a very pleasant 80 year old man with a nonischemic cardiomyopathy, chronic systolic heart failure, left bundle branch block, status post biventricular ICD implantation, and atrial fibrillation. He has done well in the interim. He denies chest pain, shortness of breath, syncope, or any ICD shock. No peripheral edema. No Known Allergies   Current Outpatient Prescriptions  Medication Sig Dispense Refill  . ALLOPURINOL PO Take 1 tablet by mouth daily.      [name] amiodarone (PACERONE) 200 MG tablet Take 1 tablet (200 mg total) by mouth daily.  90 tablet  3  . carvedilol (COREG) 6.25 MG tablet Take 1 tablet (6.25 mg total) by mouth 2 (two) times daily with a meal.  180 tablet  3  . colchicine 0.6 MG tablet Take 0.6 mg by mouth daily.        . furosemide (LASIX) 40 MG tablet Take 1 tablet (40 mg total) by mouth daily.  90 tablet  1  . isosorbide mononitrate (IMDUR) 60 MG 24 hr tablet Take 1 tablet (60 mg total) by mouth daily.  90 tablet  3  . potassium chloride SA (K-DUR,KLOR-CON) 20 MEQ tablet Take 1 tablet (20 mEq total) by mouth daily.  90 tablet  3  . simvastatin (ZOCOR) 20 MG tablet Take 1 tablet (20 mg total) by mouth at bedtime.  90 tablet  0  . spironolactone (ALDACTONE) 25 MG tablet Take 1 tablet (25 mg total) by mouth daily.  90 tablet  3  . warfarin (COUMADIN) 3 MG tablet 1 tablet daily or as directed by Coumadin Clinic.  90 tablet  1   No current facility-administered medications for this visit.     Past Medical History  Diagnosis Date  . Renal failure     acute  . Atrial fibrillation   . Chronic systolic heart failure   . ICD-St.Jude   . Hyperlipidemia     ROS:   All systems reviewed and negative except as noted in the HPI.   Past Surgical History  Procedure Laterality Date  . Cardiac defibrillator placement    . Polypectomy       Family History  Problem Relation Age of Onset  . Other      notable for CHF  . Heart  failure Mother   . Hypertension Mother   . Heart failure Father   . Hypertension Father      History   Social History  . Marital Status: Married    Spouse Name: N/A    Number of Children: 2  . Years of Education: N/A   Occupational History  . retired Environmental manager    Social History Main Topics  . Smoking status: Never Smoker   . Smokeless tobacco: Not on file  . Alcohol Use: No  . Drug Use: No  . Sexual Activity: Not on file   Other Topics Concern  . Not on file   Social History Narrative  . No narrative on file     BP 115/77  Pulse 60  Ht 5\' 7"  (1.702 m)  Wt 153 lb 3.2 oz (69.491 kg)  BMI 23.99 kg/m2  Physical Exam:  Well appearing 80 year old man, NAD HEENT: Unremarkable Neck:  7 cm JVD, no thyromegally Back:  No CVA tenderness Lungs:  Clear with no wheezes, rales, or rhonchi. HEART:  Regular rate rhythm, no murmurs, no rubs, no clicks Abd:  soft, positive bowel sounds, no organomegally, no rebound, no guarding Ext:  2 plus pulses,  no edema, no cyanosis, no clubbing Skin:  No rashes no nodules Neuro:  CN II through XII intact, motor grossly intact   DEVICE  Normal device function.  See PaceArt for details.   Assess/Plan:

## 2014-03-19 NOTE — Assessment & Plan Note (Signed)
His St. Jude ICD is working normally. Will recheck in several months.  

## 2014-03-19 NOTE — Assessment & Plan Note (Signed)
Interogation of his ICD demonstrates that he has had no atrial fib. I have asked him to reduce his dose of amiodarone to 100 mg on Saturday and Sunday and 200 mg Monday through Friday.

## 2014-03-19 NOTE — Assessment & Plan Note (Signed)
His CHF appears to be well compensated. No change in meds.

## 2014-03-19 NOTE — Patient Instructions (Signed)
Your physician wants you to follow-up in: 12 months.   You will receive a reminder letter in the mail two months in advance. If you don't receive a letter, please call our office to schedule the follow-up appointment.  Remote monitoring is used to monitor your Pacemaker of ICD from home. This monitoring reduces the number of office visits required to check your device to one time per year. It allows [place] to keep an eye on the functioning of your device to ensure it is working properly. You are scheduled for a device check from home on  June 18, 2014. You may send your transmission at any time that day. If you have a wireless device, the transmission will be sent automatically. After your physician reviews your transmission, you will receive a postcard with your next transmission date.  Your physician has recommended you make the following change in your medication:  Change amiodarone to 1 tablet on Monday thru Friday, and half tablet on Saturday and Sunday.

## 2014-04-16 ENCOUNTER — Ambulatory Visit (INDEPENDENT_AMBULATORY_CARE_PROVIDER_SITE_OTHER): Payer: Medicare HMO | Admitting: *Deleted

## 2014-04-16 DIAGNOSIS — I4891 Unspecified atrial fibrillation: Secondary | ICD-10-CM

## 2014-04-16 LAB — POCT INR: INR: 4.6

## 2014-04-17 ENCOUNTER — Telehealth: Payer: Self-pay

## 2014-04-17 MED ORDER — ATORVASTATIN CALCIUM 40 MG PO TABS: 40.0000 mg | ORAL_TABLET | Freq: Every day | ORAL | Status: DC

## 2014-04-17 NOTE — Telephone Encounter (Signed)
pt wife aware per Dr.Smith pt is to stop simvastatin and start atorvastatin 40mg  daily, due to possible interaction with amiodarone.Rx sent to pt pharmacy.pt wife verbbalized understanding.

## 2014-05-08 ENCOUNTER — Ambulatory Visit
Admission: RE | Admit: 2014-05-08 | Discharge: 2014-05-08 | Disposition: A | Discharge status: Home / Self Care | Payer: Medicare HMO | Source: Ambulatory Visit | Attending: Interventional Cardiology | Admitting: Interventional Cardiology

## 2014-05-08 ENCOUNTER — Ambulatory Visit (INDEPENDENT_AMBULATORY_CARE_PROVIDER_SITE_OTHER): Payer: Medicare HMO | Admitting: Interventional Cardiology

## 2014-05-08 ENCOUNTER — Ambulatory Visit (INDEPENDENT_AMBULATORY_CARE_PROVIDER_SITE_OTHER): Payer: Medicare HMO | Admitting: *Deleted

## 2014-05-08 ENCOUNTER — Encounter: Payer: Self-pay | Admitting: Interventional Cardiology

## 2014-05-08 VITALS — BP 98/62 | HR 60 | Ht 68.0 in | Wt 157.8 lb

## 2014-05-08 DIAGNOSIS — Z95810 Presence of automatic (implantable) cardiac defibrillator: Secondary | ICD-10-CM

## 2014-05-08 DIAGNOSIS — I5022 Chronic systolic (congestive) heart failure: Secondary | ICD-10-CM

## 2014-05-08 DIAGNOSIS — I482 Chronic atrial fibrillation, unspecified: Secondary | ICD-10-CM

## 2014-05-08 DIAGNOSIS — Z79899 Other long term (current) drug therapy: Secondary | ICD-10-CM

## 2014-05-08 DIAGNOSIS — N184 Chronic kidney disease, stage 4 (severe): Secondary | ICD-10-CM

## 2014-05-08 DIAGNOSIS — I4891 Unspecified atrial fibrillation: Secondary | ICD-10-CM

## 2014-05-08 LAB — HEPATIC FUNCTION PANEL
ALT: 31 U/L (ref 0–53)
AST: 36 U/L (ref 0–37)
Albumin: 3 g/dL — ABNORMAL LOW (ref 3.5–5.2)
Alkaline Phosphatase: 37 U/L — ABNORMAL LOW (ref 39–117)
BILIRUBIN DIRECT: 0.2 mg/dL (ref 0.0–0.3)
TOTAL PROTEIN: 7.2 g/dL (ref 6.0–8.3)
Total Bilirubin: 1.5 mg/dL — ABNORMAL HIGH (ref 0.2–1.2)

## 2014-05-08 LAB — TSH: TSH: 1.57 u[IU]/mL (ref 0.35–4.50)

## 2014-05-08 LAB — POCT INR: INR: 4.1

## 2014-05-08 NOTE — Progress Notes (Signed)
Patient ID: Tyler Gray, male   DOB: Feb 14, 1933, 80 y.o.   MRN: [id number]    [address] [place], [place]  [address] Phone: [phone] Fax:  [phone]  Date:  05/08/2014   ID:  Tyler Gray, DOB 05/20/1933, MRN [id number]  PCP:  No primary provider on file.   ASSESSMENT:  1. ischemic cardiomyopathy with chronic systolic heart failure 2. Hyperlipidemia 3. Chronic amiodarone therapy 4. CKD stage III  PLAN:  1. PA and lateral chest x-ray, TSH, and he panicked panel 2. Keep medications as listed 3. Clinical followup in 6 months    SUBJECTIVE: Tyler Gray is a 80 y.o. male without dyspnea, orthopnea, or PND. No palpitations. He is at no episodes of syncope. No transient neurological symptoms or medication side effects. Overall physical activity is diminished because of progressive musculoskeletal/gallop related complaints.   Wt Readings from Last 3 Encounters:  05/08/14 157 lb 12.8 oz (71.578 kg)  03/19/14 153 lb 3.2 oz (69.491 kg)  10/29/13 156 lb (70.761 kg)     Past Medical History  Diagnosis Date  . Renal failure     acute  . Atrial fibrillation   . Chronic systolic heart failure   . ICD-St.Jude   . Hyperlipidemia     Current Outpatient Prescriptions  Medication Sig Dispense Refill  . ALLOPURINOL PO Take 1 tablet by mouth daily.      [name] amiodarone (PACERONE) 200 MG tablet Take 1 tablet (200 mg total) by mouth daily.  90 tablet  3  . atorvastatin (LIPITOR) 40 MG tablet Take 1 tablet (40 mg total) by mouth daily.  90 tablet  0  . carvedilol (COREG) 6.25 MG tablet Take 1 tablet (6.25 mg total) by mouth 2 (two) times daily with a meal.  180 tablet  3  . furosemide (LASIX) 40 MG tablet Take 1 tablet (40 mg total) by mouth daily.  90 tablet  1  . isosorbide mononitrate (IMDUR) 60 MG 24 hr tablet Take 1 tablet (60 mg total) by mouth daily.  90 tablet  3  . potassium chloride SA (K-DUR,KLOR-CON) 20 MEQ tablet Take 1 tablet (20 mEq total) by  mouth daily.  90 tablet  3  . simvastatin (ZOCOR) 20 MG tablet Take 20 mg by mouth daily.      [name] spironolactone (ALDACTONE) 25 MG tablet Take 1 tablet (25 mg total) by mouth daily.  90 tablet  3  . warfarin (COUMADIN) 3 MG tablet 1 tablet daily or as directed by Coumadin Clinic.  90 tablet  1  . colchicine 0.6 MG tablet Take 0.6 mg by mouth daily.         No current facility-administered medications for this visit.    Allergies:   No Known Allergies  Social History:  The patient  reports that he has never smoked. He does not have any smokeless tobacco history on file. He reports that he does not drink alcohol or use illicit drugs.   ROS:  Please see the history of present illness.   Denies blood in his urine and stool   All other systems reviewed and negative.   OBJECTIVE: VS:  BP 98/62  Pulse 60  Ht 5\' 8"  (1.727 m)  Wt 157 lb 12.8 oz (71.578 kg)  BMI 24.00 kg/m2 Well nourished, well developed, in no acute distress, appears consistent with stated age HEENT: normal Neck: JVD flat while sitting. Carotid bruit absent  Cardiac:  normal S1, S2; RRR; no  murmur Lungs:  clear to auscultation bilaterally, no wheezing, rhonchi or rales Abd: soft, nontender, no hepatomegaly Ext: Edema absent. Pulses difficult to palpate Skin: warm and dry Neuro:  CNs 2-12 intact, no focal abnormalities noted  EKG:  AV sequential pacing in July       Signed, Darci Needle III, MD 05/08/2014 3:17 PM

## 2014-05-08 NOTE — Patient Instructions (Signed)
Your physician recommends that you continue on your current medications as directed. Please refer to the Current Medication list given to you today.  Lab Today: Tsh, Hepatic  A chest x-ray takes a picture of the organs and structures inside the chest, including the heart, lungs, and blood vessels. This test can show several things, including, whether the heart is enlarges; whether fluid is building up in the lungs; and whether pacemaker / defibrillator leads are still in place.   Your physician recommends that you return for lab work and chest xray in 6 months  Your physician wants you to follow-up in: 6 months with Dr.Smith You will receive a reminder letter in the mail two months in advance. If you don't receive a letter, please call our office to schedule the follow-up appointment.

## 2014-05-15 ENCOUNTER — Telehealth: Payer: Self-pay

## 2014-05-15 NOTE — Telephone Encounter (Signed)
Message copied by Jarvis Newcomer on Wed May 15, 2014 12:13 PM ------      Message from: Verdis Prime      Created: Tue May 14, 2014  7:08 PM       Liver and thyroid studies are stable. ------

## 2014-05-15 NOTE — Telephone Encounter (Signed)
pt aware of lab results.Liver and thyroid studies are stable.pt aware of cxr results.cxr is stable. pt verbalized understanding.

## 2014-05-22 ENCOUNTER — Ambulatory Visit (INDEPENDENT_AMBULATORY_CARE_PROVIDER_SITE_OTHER): Payer: Medicare HMO | Admitting: *Deleted

## 2014-05-22 DIAGNOSIS — I4891 Unspecified atrial fibrillation: Secondary | ICD-10-CM

## 2014-05-22 LAB — POCT INR: INR: 3.6

## 2014-06-18 ENCOUNTER — Encounter: Payer: Self-pay | Admitting: Internal Medicine

## 2014-06-18 ENCOUNTER — Ambulatory Visit (INDEPENDENT_AMBULATORY_CARE_PROVIDER_SITE_OTHER): Payer: Medicare HMO | Admitting: *Deleted

## 2014-06-18 DIAGNOSIS — I5022 Chronic systolic (congestive) heart failure: Secondary | ICD-10-CM

## 2014-06-18 DIAGNOSIS — I480 Paroxysmal atrial fibrillation: Secondary | ICD-10-CM

## 2014-06-19 LAB — MDC_IDC_ENUM_SESS_TYPE_REMOTE
Battery Remaining Longevity: 31 mo
Battery Voltage: 2.9 V
Brady Statistic AP VS Percent: 1 %
Brady Statistic AS VP Percent: 1.6 %
Brady Statistic AS VS Percent: 1 %
Brady Statistic RA Percent Paced: 98 %
HighPow Impedance: 53 Ohm
Lead Channel Impedance Value: 360 Ohm
Lead Channel Impedance Value: 680 Ohm
Lead Channel Pacing Threshold Amplitude: 0.5 V
Lead Channel Pacing Threshold Amplitude: 1.25 V
Lead Channel Pacing Threshold Pulse Width: 0.5 ms
Lead Channel Pacing Threshold Pulse Width: 0.5 ms
Lead Channel Pacing Threshold Pulse Width: 0.8 ms
Lead Channel Sensing Intrinsic Amplitude: 11.4 mV
Lead Channel Setting Pacing Amplitude: 2 V
Lead Channel Setting Pacing Amplitude: 2.5 V
Lead Channel Setting Pacing Pulse Width: 0.8 ms
MDC IDC MSMT BATTERY REMAINING PERCENTAGE: 39 %
MDC IDC MSMT LEADCHNL RA PACING THRESHOLD AMPLITUDE: 1 V
MDC IDC MSMT LEADCHNL RA SENSING INTR AMPL: 3.2 mV
MDC IDC MSMT LEADCHNL RV IMPEDANCE VALUE: 610 Ohm
MDC IDC PG SERIAL: 607910
MDC IDC SESS DTM: 20151006062722
MDC IDC SET LEADCHNL RV PACING AMPLITUDE: 2 V
MDC IDC SET LEADCHNL RV PACING PULSEWIDTH: 0.5 ms
MDC IDC SET LEADCHNL RV SENSING SENSITIVITY: 0.5 mV
MDC IDC STAT BRADY AP VP PERCENT: 98 %
Zone Setting Detection Interval: 330 ms

## 2014-06-19 NOTE — Progress Notes (Signed)
Remote ICD transmission.   

## 2014-06-20 ENCOUNTER — Ambulatory Visit (INDEPENDENT_AMBULATORY_CARE_PROVIDER_SITE_OTHER): Payer: Medicare HMO | Admitting: Pharmacist

## 2014-06-20 DIAGNOSIS — I4891 Unspecified atrial fibrillation: Secondary | ICD-10-CM

## 2014-06-20 DIAGNOSIS — I480 Paroxysmal atrial fibrillation: Secondary | ICD-10-CM

## 2014-06-20 LAB — POCT INR: INR: 3.2

## 2014-07-03 ENCOUNTER — Ambulatory Visit (INDEPENDENT_AMBULATORY_CARE_PROVIDER_SITE_OTHER): Payer: Medicare HMO | Admitting: Pharmacist

## 2014-07-03 DIAGNOSIS — I4891 Unspecified atrial fibrillation: Secondary | ICD-10-CM

## 2014-07-03 LAB — POCT INR: INR: 3.5

## 2014-07-05 ENCOUNTER — Encounter: Payer: Self-pay | Admitting: *Deleted

## 2014-07-17 ENCOUNTER — Ambulatory Visit (INDEPENDENT_AMBULATORY_CARE_PROVIDER_SITE_OTHER): Payer: Medicare HMO | Admitting: *Deleted

## 2014-07-17 DIAGNOSIS — I4891 Unspecified atrial fibrillation: Secondary | ICD-10-CM

## 2014-07-17 LAB — POCT INR: INR: 2.5

## 2014-07-23 ENCOUNTER — Other Ambulatory Visit: Payer: Self-pay | Admitting: Interventional Cardiology

## 2014-08-07 ENCOUNTER — Ambulatory Visit (INDEPENDENT_AMBULATORY_CARE_PROVIDER_SITE_OTHER): Payer: Medicare HMO | Admitting: Pharmacist

## 2014-08-07 DIAGNOSIS — I4891 Unspecified atrial fibrillation: Secondary | ICD-10-CM

## 2014-08-07 LAB — POCT INR: INR: 2.2

## 2014-09-04 ENCOUNTER — Ambulatory Visit (INDEPENDENT_AMBULATORY_CARE_PROVIDER_SITE_OTHER): Payer: Medicare HMO | Admitting: *Deleted

## 2014-09-04 DIAGNOSIS — I4891 Unspecified atrial fibrillation: Secondary | ICD-10-CM

## 2014-09-04 LAB — POCT INR: INR: 2.7

## 2014-09-18 ENCOUNTER — Ambulatory Visit (INDEPENDENT_AMBULATORY_CARE_PROVIDER_SITE_OTHER): Payer: Medicare HMO | Admitting: *Deleted

## 2014-09-18 DIAGNOSIS — I5022 Chronic systolic (congestive) heart failure: Secondary | ICD-10-CM

## 2014-09-18 DIAGNOSIS — I480 Paroxysmal atrial fibrillation: Secondary | ICD-10-CM

## 2014-09-18 LAB — MDC_IDC_ENUM_SESS_TYPE_REMOTE
Battery Remaining Percentage: 36 %
Battery Voltage: 2.89 V
Brady Statistic AP VP Percent: 97 %
Brady Statistic AS VP Percent: 1.9 %
Brady Statistic RV Percent Paced: 99 %
HighPow Impedance: 52 Ohm
Implantable Pulse Generator Serial Number: 607910
Lead Channel Impedance Value: 690 Ohm
Lead Channel Pacing Threshold Amplitude: 0.5 V
Lead Channel Pacing Threshold Amplitude: 1.25 V
Lead Channel Pacing Threshold Pulse Width: 0.5 ms
Lead Channel Pacing Threshold Pulse Width: 0.8 ms
Lead Channel Sensing Intrinsic Amplitude: 1 mV
Lead Channel Setting Pacing Amplitude: 2 V
Lead Channel Setting Pacing Amplitude: 2 V
Lead Channel Setting Pacing Amplitude: 2.5 V
Lead Channel Setting Pacing Pulse Width: 0.5 ms
MDC IDC MSMT BATTERY REMAINING LONGEVITY: 30 mo
MDC IDC MSMT LEADCHNL RA IMPEDANCE VALUE: 360 Ohm
MDC IDC MSMT LEADCHNL RA PACING THRESHOLD AMPLITUDE: 1 V
MDC IDC MSMT LEADCHNL RV IMPEDANCE VALUE: 690 Ohm
MDC IDC MSMT LEADCHNL RV PACING THRESHOLD PULSEWIDTH: 0.5 ms
MDC IDC MSMT LEADCHNL RV SENSING INTR AMPL: 7.4 mV
MDC IDC SESS DTM: 20160106085610
MDC IDC SET LEADCHNL LV PACING PULSEWIDTH: 0.8 ms
MDC IDC SET LEADCHNL RV SENSING SENSITIVITY: 0.5 mV
MDC IDC STAT BRADY AP VS PERCENT: 1 %
MDC IDC STAT BRADY AS VS PERCENT: 1 %
MDC IDC STAT BRADY RA PERCENT PACED: 97 %
Zone Setting Detection Interval: 330 ms

## 2014-09-18 NOTE — Progress Notes (Signed)
Remote ICD transmission.   

## 2014-09-30 ENCOUNTER — Encounter: Payer: Self-pay | Admitting: Cardiology

## 2014-10-02 ENCOUNTER — Ambulatory Visit (INDEPENDENT_AMBULATORY_CARE_PROVIDER_SITE_OTHER): Payer: Medicare HMO | Admitting: *Deleted

## 2014-10-02 DIAGNOSIS — I4891 Unspecified atrial fibrillation: Secondary | ICD-10-CM

## 2014-10-02 LAB — POCT INR: INR: 1.9

## 2014-10-14 ENCOUNTER — Encounter: Payer: Self-pay | Admitting: Internal Medicine

## 2014-10-25 ENCOUNTER — Other Ambulatory Visit: Payer: Self-pay | Admitting: Interventional Cardiology

## 2014-10-29 ENCOUNTER — Ambulatory Visit (INDEPENDENT_AMBULATORY_CARE_PROVIDER_SITE_OTHER): Payer: Medicare HMO | Admitting: *Deleted

## 2014-10-29 DIAGNOSIS — I4891 Unspecified atrial fibrillation: Secondary | ICD-10-CM

## 2014-10-29 LAB — POCT INR: INR: 2

## 2014-11-15 ENCOUNTER — Encounter: Payer: Self-pay | Admitting: Interventional Cardiology

## 2014-11-15 ENCOUNTER — Ambulatory Visit (INDEPENDENT_AMBULATORY_CARE_PROVIDER_SITE_OTHER): Payer: Medicare HMO | Admitting: Interventional Cardiology

## 2014-11-15 VITALS — BP 102/60 | HR 60 | Ht 69.0 in | Wt 154.8 lb

## 2014-11-15 DIAGNOSIS — Z95810 Presence of automatic (implantable) cardiac defibrillator: Secondary | ICD-10-CM

## 2014-11-15 DIAGNOSIS — I482 Chronic atrial fibrillation, unspecified: Secondary | ICD-10-CM

## 2014-11-15 DIAGNOSIS — N184 Chronic kidney disease, stage 4 (severe): Secondary | ICD-10-CM

## 2014-11-15 DIAGNOSIS — I5022 Chronic systolic (congestive) heart failure: Secondary | ICD-10-CM

## 2014-11-15 DIAGNOSIS — I9589 Other hypotension: Secondary | ICD-10-CM

## 2014-11-15 DIAGNOSIS — Z79899 Other long term (current) drug therapy: Secondary | ICD-10-CM

## 2014-11-15 LAB — HEPATIC FUNCTION PANEL
ALK PHOS: 34 U/L — AB (ref 39–117)
ALT: 33 U/L (ref 0–53)
AST: 39 U/L — AB (ref 0–37)
Albumin: 3.4 g/dL — ABNORMAL LOW (ref 3.5–5.2)
BILIRUBIN DIRECT: 0.4 mg/dL — AB (ref 0.0–0.3)
BILIRUBIN INDIRECT: 1.1 mg/dL (ref 0.2–1.2)
TOTAL PROTEIN: 7.4 g/dL (ref 6.0–8.3)
Total Bilirubin: 1.5 mg/dL — ABNORMAL HIGH (ref 0.2–1.2)

## 2014-11-15 LAB — BASIC METABOLIC PANEL
BUN: 29 mg/dL — ABNORMAL HIGH (ref 6–23)
CHLORIDE: 106 meq/L (ref 96–112)
CO2: 18 mEq/L — ABNORMAL LOW (ref 19–32)
CREATININE: 1.79 mg/dL — AB (ref 0.50–1.35)
Calcium: 8.9 mg/dL (ref 8.4–10.5)
Glucose, Bld: 101 mg/dL — ABNORMAL HIGH (ref 70–99)
POTASSIUM: 4.5 meq/L (ref 3.5–5.3)
SODIUM: 139 meq/L (ref 135–145)

## 2014-11-15 MED ORDER — SPIRONOLACTONE 25 MG PO TABS: 12.5000 mg | ORAL_TABLET | Freq: Every day | ORAL | Status: DC

## 2014-11-15 NOTE — Progress Notes (Signed)
Cardiology Office Note   Date:  11/15/2014   ID:  Tyler Gray, DOB 1933-08-21, MRN [id number]  PCP:  Eino Farber, MD  Cardiologist:   Lesleigh Noe, MD   No chief complaint on file.     History of Present Illness: Tyler Gray is a 81 y.o. male who presents for stock heart failure. His been having some orthostatic dizziness. He denies chest discomfort and dyspnea. If he gets up and tries to walk after sitting for a while he always has to sit back down within seconds because of lightheadedness. There is no orthopnea, PND, or edema.    Past Medical History  Diagnosis Date  . Renal failure     acute  . Atrial fibrillation   . Chronic systolic heart failure   . ICD-St.Jude   . Hyperlipidemia     Past Surgical History  Procedure Laterality Date  . Cardiac defibrillator placement    . Polypectomy       Current Outpatient Prescriptions  Medication Sig Dispense Refill  . ALLOPURINOL PO Take 1 tablet by mouth daily.    [name] amiodarone (PACERONE) 200 MG tablet Take 1 tablet (200 mg total) by mouth daily. 90 tablet 3  . atorvastatin (LIPITOR) 40 MG tablet TAKE 1 TABLET EVERY DAY 90 tablet 0  . carvedilol (COREG) 6.25 MG tablet Take 1 tablet (6.25 mg total) by mouth 2 (two) times daily with a meal. 180 tablet 3  . furosemide (LASIX) 40 MG tablet TAKE 1 TABLET EVERY DAY 90 tablet 1  . potassium chloride SA (K-DUR,KLOR-CON) 20 MEQ tablet Take 1 tablet (20 mEq total) by mouth daily. 90 tablet 3  . simvastatin (ZOCOR) 20 MG tablet Take 20 mg by mouth daily.    [name] spironolactone (ALDACTONE) 25 MG tablet Take 0.5 tablets (12.5 mg total) by mouth daily.    [name] warfarin (COUMADIN) 3 MG tablet TAKE 1 TABLET DAILY OR AS DIRECTED BY COUMADIN CLINIC. 90 tablet 1   No current facility-administered medications for this visit.    Allergies:   Review of patient's allergies indicates no known allergies.    Social History:  The patient  reports that he has never smoked. He  does not have any smokeless tobacco history on file. He reports that he does not drink alcohol or use illicit drugs.   Family History:  The patient's family history includes Heart failure in his father and mother; Hypertension in his father and mother; Other in an other family member.    ROS:  Please see the history of present illness.   Otherwise, review of systems are positive for none.   All other systems are reviewed and negative.    PHYSICAL EXAM: VS:  BP 102/60 mmHg  Pulse 60  Ht 5\' 9"  (1.753 m)  Wt 154 lb 12.8 oz (70.217 kg)  BMI 22.85 kg/m2 , BMI Body mass index is 22.85 kg/(m^2). GEN: Well nourished, well developed, in no acute distress HEENT: normal Neck: no JVD, carotid bruits, or masses Cardiac: RRR;; there is a soft systolic murmur ; rubs, or gallops,no edema  Respiratory:  clear to auscultation bilaterally, normal work of breathing GI: soft, nontender, nondistended, + BS MS: no deformity or atrophy Skin: warm and dry, no rash Neuro:  Strength and sensation are intact Psych: euthymic mood, full affect   EKG:  EKG is not ordered today.   Recent Labs: 05/08/2014: ALT 31; TSH 1.57    Lipid Panel No results found for: CHOL,  TRIG, HDL, CHOLHDL, VLDL, LDLCALC, LDLDIRECT    Wt Readings from Last 3 Encounters:  11/15/14 154 lb 12.8 oz (70.217 kg)  05/08/14 157 lb 12.8 oz (71.578 kg)  03/19/14 153 lb 3.2 oz (69.491 kg)      Other studies Reviewed: Additional studies/ records that were reviewed today include: .    ASSESSMENT AND PLAN:  Other specified hypotension: Related to the hypotensive effect of multiple medications  Chronic atrial fibrillation: Control rate  Chronic systolic heart failure: No evidence of volume overload  Chronic kidney disease, stage IV (severe): Current status not known  Automatic implantable cardioverter-defibrillator in situ  On amiodarone therapy: No evidence of toxicity     Current medicines are reviewed at length with  the patient today.  The patient has concerns regarding medicines.  The following changes have been made:  One of his medications is costing too much. He is not sure what it is. He also states that he is not on colchicine.  Labs/ tests ordered today include:   Orders Placed This Encounter  Procedures  . TSH  . Hepatic function panel  . Basic metabolic panel     Disposition:   FU with Mendel Ryder in 6 months   Signed, Lesleigh Noe, MD  11/15/2014 4:08 PM    [facility] [address] [place], [place], [place]  [address] Phone: [phone]; Fax: [phone]

## 2014-11-15 NOTE — Patient Instructions (Addendum)
Your physician has recommended you make the following change in your medication:  1) STOP Imdur 2) DECREASE Spironolactone to 12.5mg  daily  Lab Today: Tsh, Hepatic, Bmet  Your physician wants you to follow-up in: 6 months with Dr.Smith You will receive a reminder letter in the mail two months in advance. If you don't receive a letter, please call our office to schedule the follow-up appointment.

## 2014-11-16 LAB — TSH: TSH: 1.468 u[IU]/mL (ref 0.350–4.500)

## 2014-11-20 ENCOUNTER — Telehealth: Payer: Self-pay

## 2014-11-20 NOTE — Telephone Encounter (Signed)
Pt wife aware of lab results.Liver and thyriod are stable.she verbalized understanding.

## 2014-11-20 NOTE — Telephone Encounter (Signed)
-----   Message from Lesleigh Noe, MD sent at 11/17/2014  8:29 AM EST ----- Liver and thyriod are stable.

## 2014-11-26 ENCOUNTER — Ambulatory Visit (INDEPENDENT_AMBULATORY_CARE_PROVIDER_SITE_OTHER): Payer: Medicare HMO | Admitting: *Deleted

## 2014-11-26 DIAGNOSIS — I4891 Unspecified atrial fibrillation: Secondary | ICD-10-CM

## 2014-11-26 LAB — POCT INR: INR: 2.3

## 2014-12-23 ENCOUNTER — Telehealth: Payer: Self-pay | Admitting: Cardiology

## 2014-12-23 ENCOUNTER — Ambulatory Visit (INDEPENDENT_AMBULATORY_CARE_PROVIDER_SITE_OTHER): Payer: Medicare HMO | Admitting: *Deleted

## 2014-12-23 ENCOUNTER — Encounter: Payer: Medicare HMO | Admitting: *Deleted

## 2014-12-23 DIAGNOSIS — I482 Chronic atrial fibrillation, unspecified: Secondary | ICD-10-CM

## 2014-12-23 DIAGNOSIS — I5022 Chronic systolic (congestive) heart failure: Secondary | ICD-10-CM

## 2014-12-23 DIAGNOSIS — Z7189 Other specified counseling: Secondary | ICD-10-CM | POA: Insufficient documentation

## 2014-12-23 DIAGNOSIS — I4891 Unspecified atrial fibrillation: Secondary | ICD-10-CM

## 2014-12-23 DIAGNOSIS — Z5181 Encounter for therapeutic drug level monitoring: Secondary | ICD-10-CM | POA: Diagnosis not present

## 2014-12-23 LAB — POCT INR: INR: 1.8

## 2014-12-23 NOTE — Telephone Encounter (Signed)
Confirmed remote transmission w/ pt wife.   

## 2014-12-24 ENCOUNTER — Encounter: Payer: Self-pay | Admitting: Cardiology

## 2014-12-30 ENCOUNTER — Telehealth: Payer: Self-pay | Admitting: Internal Medicine

## 2014-12-30 NOTE — Telephone Encounter (Signed)
New message         Pt transmitter is beeping   Does pt need to do something

## 2014-12-30 NOTE — Telephone Encounter (Signed)
Informed pt that transmission was not received. Attempted to help pt w/ trouble shooting after several attempts I informed pt that he would need to contact tech services. Pt verbalized understanding.

## 2014-12-31 ENCOUNTER — Encounter: Payer: Self-pay | Admitting: Internal Medicine

## 2014-12-31 ENCOUNTER — Ambulatory Visit (INDEPENDENT_AMBULATORY_CARE_PROVIDER_SITE_OTHER): Payer: Medicare HMO | Admitting: *Deleted

## 2014-12-31 DIAGNOSIS — I5022 Chronic systolic (congestive) heart failure: Secondary | ICD-10-CM

## 2014-12-31 DIAGNOSIS — I482 Chronic atrial fibrillation, unspecified: Secondary | ICD-10-CM

## 2015-01-01 ENCOUNTER — Other Ambulatory Visit: Payer: Self-pay | Admitting: Adult Health

## 2015-01-01 ENCOUNTER — Other Ambulatory Visit: Payer: Self-pay | Admitting: Interventional Cardiology

## 2015-01-03 NOTE — Progress Notes (Signed)
Remote ICD transmission.   

## 2015-01-17 LAB — CUP PACEART REMOTE DEVICE CHECK
Battery Remaining Longevity: 28 mo
Battery Voltage: 2.87 V
Brady Statistic AP VP Percent: 98 %
Brady Statistic AS VS Percent: 1 %
Brady Statistic RA Percent Paced: 98 %
Date Time Interrogation Session: 20160411070745
HighPow Impedance: 52 Ohm
Lead Channel Impedance Value: 340 Ohm
Lead Channel Impedance Value: 640 Ohm
Lead Channel Pacing Threshold Amplitude: 1.25 V
Lead Channel Pacing Threshold Pulse Width: 0.5 ms
Lead Channel Sensing Intrinsic Amplitude: 1.2 mV
Lead Channel Setting Pacing Pulse Width: 0.5 ms
Lead Channel Setting Pacing Pulse Width: 0.8 ms
Lead Channel Setting Sensing Sensitivity: 0.5 mV
MDC IDC MSMT BATTERY REMAINING PERCENTAGE: 34 %
MDC IDC MSMT LEADCHNL LV PACING THRESHOLD PULSEWIDTH: 0.8 ms
MDC IDC MSMT LEADCHNL RA PACING THRESHOLD AMPLITUDE: 1 V
MDC IDC MSMT LEADCHNL RV IMPEDANCE VALUE: 680 Ohm
MDC IDC MSMT LEADCHNL RV PACING THRESHOLD AMPLITUDE: 0.5 V
MDC IDC MSMT LEADCHNL RV PACING THRESHOLD PULSEWIDTH: 0.5 ms
MDC IDC MSMT LEADCHNL RV SENSING INTR AMPL: 0.5 mV
MDC IDC SET LEADCHNL LV PACING AMPLITUDE: 2.5 V
MDC IDC SET LEADCHNL RA PACING AMPLITUDE: 2 V
MDC IDC SET LEADCHNL RV PACING AMPLITUDE: 2 V
MDC IDC STAT BRADY AP VS PERCENT: 1 %
MDC IDC STAT BRADY AS VP PERCENT: 1.3 %
MDC IDC STAT BRADY RV PERCENT PACED: 99 % — AB
Pulse Gen Serial Number: 607910
Zone Setting Detection Interval: 330 ms

## 2015-01-23 ENCOUNTER — Ambulatory Visit (INDEPENDENT_AMBULATORY_CARE_PROVIDER_SITE_OTHER): Payer: Medicare HMO | Admitting: *Deleted

## 2015-01-23 DIAGNOSIS — I4891 Unspecified atrial fibrillation: Secondary | ICD-10-CM

## 2015-01-23 DIAGNOSIS — I482 Chronic atrial fibrillation, unspecified: Secondary | ICD-10-CM

## 2015-01-23 DIAGNOSIS — Z5181 Encounter for therapeutic drug level monitoring: Secondary | ICD-10-CM

## 2015-01-23 LAB — POCT INR: INR: 1.8

## 2015-02-05 ENCOUNTER — Ambulatory Visit (INDEPENDENT_AMBULATORY_CARE_PROVIDER_SITE_OTHER): Payer: Medicare HMO

## 2015-02-05 DIAGNOSIS — I4891 Unspecified atrial fibrillation: Secondary | ICD-10-CM

## 2015-02-05 DIAGNOSIS — I482 Chronic atrial fibrillation, unspecified: Secondary | ICD-10-CM

## 2015-02-05 DIAGNOSIS — Z5181 Encounter for therapeutic drug level monitoring: Secondary | ICD-10-CM | POA: Diagnosis not present

## 2015-02-05 LAB — POCT INR: INR: 1.9

## 2015-02-19 ENCOUNTER — Ambulatory Visit (INDEPENDENT_AMBULATORY_CARE_PROVIDER_SITE_OTHER): Payer: Medicare HMO | Admitting: *Deleted

## 2015-02-19 DIAGNOSIS — I482 Chronic atrial fibrillation, unspecified: Secondary | ICD-10-CM

## 2015-02-19 DIAGNOSIS — Z5181 Encounter for therapeutic drug level monitoring: Secondary | ICD-10-CM

## 2015-02-19 DIAGNOSIS — I4891 Unspecified atrial fibrillation: Secondary | ICD-10-CM | POA: Diagnosis not present

## 2015-02-19 LAB — POCT INR: INR: 1.9

## 2015-03-04 ENCOUNTER — Ambulatory Visit (INDEPENDENT_AMBULATORY_CARE_PROVIDER_SITE_OTHER): Payer: Medicare HMO | Admitting: *Deleted

## 2015-03-04 DIAGNOSIS — Z5181 Encounter for therapeutic drug level monitoring: Secondary | ICD-10-CM

## 2015-03-04 DIAGNOSIS — I482 Chronic atrial fibrillation, unspecified: Secondary | ICD-10-CM

## 2015-03-04 DIAGNOSIS — I4891 Unspecified atrial fibrillation: Secondary | ICD-10-CM | POA: Diagnosis not present

## 2015-03-04 DIAGNOSIS — Z95810 Presence of automatic (implantable) cardiac defibrillator: Secondary | ICD-10-CM

## 2015-03-04 LAB — CUP PACEART INCLINIC DEVICE CHECK
Brady Statistic RA Percent Paced: 98 %
Brady Statistic RV Percent Paced: 99.41 %
Date Time Interrogation Session: 20160621140735
HighPow Impedance: 54.1364
Lead Channel Impedance Value: 350 Ohm
Lead Channel Impedance Value: 675 Ohm
Lead Channel Pacing Threshold Amplitude: 1.875 V
Lead Channel Sensing Intrinsic Amplitude: 1.2 mV
Lead Channel Setting Pacing Amplitude: 2 V
Lead Channel Setting Pacing Pulse Width: 0.5 ms
Lead Channel Setting Pacing Pulse Width: 0.8 ms
Lead Channel Setting Sensing Sensitivity: 0.5 mV
MDC IDC MSMT BATTERY REMAINING LONGEVITY: 25.2 mo
MDC IDC MSMT LEADCHNL RA PACING THRESHOLD PULSEWIDTH: 0.5 ms
MDC IDC MSMT LEADCHNL RV IMPEDANCE VALUE: 737.5 Ohm
MDC IDC MSMT LEADCHNL RV PACING THRESHOLD AMPLITUDE: 0.5 V
MDC IDC MSMT LEADCHNL RV PACING THRESHOLD PULSEWIDTH: 0.5 ms
MDC IDC MSMT LEADCHNL RV SENSING INTR AMPL: 12 mV
MDC IDC SET LEADCHNL LV PACING AMPLITUDE: 2.5 V
MDC IDC SET LEADCHNL RV PACING AMPLITUDE: 2 V
Pulse Gen Serial Number: 607910
Zone Setting Detection Interval: 330 ms

## 2015-03-04 LAB — POCT INR: INR: 2.9

## 2015-03-04 NOTE — Progress Notes (Signed)
CRT-D device check in office to check R waves. Sensing consistent with previous in office device measurements. Lead impedance trends stable over time. No mode switch episodes recorded. No ventricular arrhythmia episodes recorded. Patient bi-ventricularly pacing >99% of the time. Device programmed with appropriate safety margins. Estimated longevity 2.1 years.  Patient enrolled in remote follow up. ROV with GT 03/20/15 at 1:45pm.

## 2015-03-06 ENCOUNTER — Other Ambulatory Visit: Payer: Self-pay | Admitting: Interventional Cardiology

## 2015-03-07 ENCOUNTER — Other Ambulatory Visit: Payer: Self-pay

## 2015-03-07 MED ORDER — SPIRONOLACTONE 25 MG PO TABS: ORAL_TABLET | ORAL | Status: DC

## 2015-03-07 MED ORDER — ATORVASTATIN CALCIUM 40 MG PO TABS: 40.0000 mg | ORAL_TABLET | Freq: Every day | ORAL | Status: DC

## 2015-03-07 MED ORDER — FUROSEMIDE 40 MG PO TABS: 40.0000 mg | ORAL_TABLET | Freq: Every day | ORAL | Status: DC

## 2015-03-18 ENCOUNTER — Encounter: Payer: Self-pay | Admitting: Internal Medicine

## 2015-03-20 ENCOUNTER — Ambulatory Visit (INDEPENDENT_AMBULATORY_CARE_PROVIDER_SITE_OTHER): Payer: Medicare HMO | Admitting: Pharmacist

## 2015-03-20 ENCOUNTER — Other Ambulatory Visit: Payer: Self-pay

## 2015-03-20 ENCOUNTER — Encounter: Payer: Self-pay | Admitting: Internal Medicine

## 2015-03-20 ENCOUNTER — Ambulatory Visit (INDEPENDENT_AMBULATORY_CARE_PROVIDER_SITE_OTHER): Payer: Medicare HMO | Admitting: Internal Medicine

## 2015-03-20 VITALS — BP 138/80 | HR 60 | Ht 69.0 in | Wt 157.2 lb

## 2015-03-20 DIAGNOSIS — I4891 Unspecified atrial fibrillation: Secondary | ICD-10-CM

## 2015-03-20 DIAGNOSIS — I482 Chronic atrial fibrillation, unspecified: Secondary | ICD-10-CM

## 2015-03-20 DIAGNOSIS — Z95810 Presence of automatic (implantable) cardiac defibrillator: Secondary | ICD-10-CM | POA: Diagnosis not present

## 2015-03-20 DIAGNOSIS — Z4502 Encounter for adjustment and management of automatic implantable cardiac defibrillator: Secondary | ICD-10-CM

## 2015-03-20 DIAGNOSIS — I5022 Chronic systolic (congestive) heart failure: Secondary | ICD-10-CM | POA: Diagnosis not present

## 2015-03-20 DIAGNOSIS — I480 Paroxysmal atrial fibrillation: Secondary | ICD-10-CM

## 2015-03-20 DIAGNOSIS — Z5181 Encounter for therapeutic drug level monitoring: Secondary | ICD-10-CM

## 2015-03-20 LAB — CUP PACEART INCLINIC DEVICE CHECK
Battery Remaining Longevity: 22.8 mo
Date Time Interrogation Session: 20160707143920
HighPow Impedance: 54.3644
Lead Channel Impedance Value: 700 Ohm
Lead Channel Impedance Value: 712.5 Ohm
Lead Channel Pacing Threshold Amplitude: 0.5 V
Lead Channel Pacing Threshold Pulse Width: 0.5 ms
Lead Channel Sensing Intrinsic Amplitude: 1.5 mV
Lead Channel Sensing Intrinsic Amplitude: 12 mV
Lead Channel Setting Pacing Amplitude: 2.25 V
Lead Channel Setting Pacing Pulse Width: 0.8 ms
MDC IDC MSMT LEADCHNL LV PACING THRESHOLD AMPLITUDE: 1 V
MDC IDC MSMT LEADCHNL LV PACING THRESHOLD PULSEWIDTH: 0.8 ms
MDC IDC MSMT LEADCHNL RA IMPEDANCE VALUE: 362.5 Ohm
MDC IDC MSMT LEADCHNL RA PACING THRESHOLD AMPLITUDE: 1.25 V
MDC IDC MSMT LEADCHNL RA PACING THRESHOLD PULSEWIDTH: 1 ms
MDC IDC SET LEADCHNL LV PACING AMPLITUDE: 2 V
MDC IDC SET LEADCHNL RV PACING AMPLITUDE: 2 V
MDC IDC SET LEADCHNL RV PACING PULSEWIDTH: 0.5 ms
MDC IDC SET LEADCHNL RV SENSING SENSITIVITY: 0.5 mV
MDC IDC STAT BRADY RA PERCENT PACED: 98 %
MDC IDC STAT BRADY RV PERCENT PACED: 99.43 %
Pulse Gen Serial Number: 607910
Zone Setting Detection Interval: 330 ms

## 2015-03-20 LAB — POCT INR: INR: 3.3

## 2015-03-20 NOTE — Progress Notes (Signed)
HPI Mr. Urvina returns today for followup. He is a very pleasant 81 year old man with a nonischemic cardiomyopathy, chronic systolic heart failure, left bundle branch block, status post biventricular ICD implantation, and atrial fibrillation. He has done well in the interim. He denies chest pain, shortness of breath, syncope, or any ICD shock. No peripheral edema. No ICD shock.  No Known Allergies   Current Outpatient Prescriptions  Medication Sig Dispense Refill  . ALLOPURINOL PO Take 1 tablet by mouth daily.    [name] amiodarone (PACERONE) 200 MG tablet TAKE 1 TABLET EVERY DAY (Patient taking differently: TAKE 1 TABLET BY MOUTH EVERY DAY) 90 tablet 1  . atorvastatin (LIPITOR) 40 MG tablet Take 1 tablet (40 mg total) by mouth daily. 90 tablet 1  . carvedilol (COREG) 6.25 MG tablet TAKE 1 TABLET  BY MOUTH 2 (TWO) TIMES DAILY WITH A MEAL. 180 tablet 1  . furosemide (LASIX) 40 MG tablet Take 1 tablet (40 mg total) by mouth daily. 90 tablet 1  . potassium chloride SA (K-DUR,KLOR-CON) 20 MEQ tablet TAKE 1 TABLET EVERY DAY 90 tablet 1  . simvastatin (ZOCOR) 20 MG tablet Take 20 mg by mouth daily.    [name] spironolactone (ALDACTONE) 25 MG tablet Takes1/2 tablet (0.5) mg by mouth daily 90 tablet 1  . warfarin (COUMADIN) 3 MG tablet TAKE 1 TABLET DAILY OR AS DIRECTED BY COUMADIN CLINIC. 90 tablet 1  . triamcinolone cream (KENALOG) 0.1 % Apply to affected area twice a day  0   No current facility-administered medications for this visit.     Past Medical History  Diagnosis Date  . Renal failure     acute  . Atrial fibrillation   . Chronic systolic heart failure   . ICD-St.Jude   . Hyperlipidemia     ROS:   All systems reviewed and negative except as noted in the HPI.   Past Surgical History  Procedure Laterality Date  . Cardiac defibrillator placement    . Polypectomy       Family History  Problem Relation Age of Onset  . Other      notable for CHF  . Heart failure Mother   .  Hypertension Mother   . Heart failure Father   . Hypertension Father      History   Social History  . Marital Status: Married    Spouse Name: N/A  . Number of Children: 2  . Years of Education: N/A   Occupational History  . retired Environmental manager    Social History Main Topics  . Smoking status: Never Smoker   . Smokeless tobacco: Not on file  . Alcohol Use: No  . Drug Use: No  . Sexual Activity: Not on file   Other Topics Concern  . Not on file   Social History Narrative     BP 138/80 mmHg  Pulse 60  Ht 5\' 9"  (1.753 m)  Wt 157 lb 3.2 oz (71.305 kg)  BMI 23.20 kg/m2  Physical Exam:  Well appearing 81 year old man, NAD HEENT: Unremarkable Neck:  7 cm JVD, no thyromegally Back:  No CVA tenderness Lungs:  Clear with no wheezes, rales, or rhonchi. HEART:  Regular rate rhythm, no murmurs, no rubs, no clicks Abd:  soft, positive bowel sounds, no organomegally, no rebound, no guarding Ext:  2 plus pulses, no edema, no cyanosis, no clubbing Skin:  No rashes no nodules Neuro:  CN II through XII intact, motor grossly intact   DEVICE  Normal device function.  See  PaceArt for details.   Assess/Plan:

## 2015-03-20 NOTE — Patient Instructions (Signed)
Medication Instructions:  Your physician recommends that you continue on your current medications as directed. Please refer to the Current Medication list given to you today.  Labwork: None ordered  Testing/Procedures: None ordered  Follow-Up: Remote monitoring is used to monitor your Pacemaker of ICD from home. This monitoring reduces the number of office visits required to check your device to one time per year. It allows us to keep an eye on the functioning of your device to ensure it is working properly. You are scheduled for a device check from home on 06/19/15. You may send your transmission at any time that day. If you have a wireless device, the transmission will be sent automatically. After your physician reviews your transmission, you will receive a postcard with your next transmission date.  Your physician wants you to follow-up in: 1 year with Dr. Taylor.  You will receive a reminder letter in the mail two months in advance. If you don't receive a letter, please call our office to schedule the follow-up appointment.   Any Other Special Instructions Will Be Listed Below (If Applicable). Thank you for choosing [facility]!!         

## 2015-04-03 ENCOUNTER — Ambulatory Visit (INDEPENDENT_AMBULATORY_CARE_PROVIDER_SITE_OTHER): Payer: Medicare HMO

## 2015-04-03 DIAGNOSIS — I482 Chronic atrial fibrillation, unspecified: Secondary | ICD-10-CM

## 2015-04-03 DIAGNOSIS — I4891 Unspecified atrial fibrillation: Secondary | ICD-10-CM | POA: Diagnosis not present

## 2015-04-03 DIAGNOSIS — Z5181 Encounter for therapeutic drug level monitoring: Secondary | ICD-10-CM | POA: Diagnosis not present

## 2015-04-03 LAB — POCT INR: INR: 2.6

## 2015-04-24 ENCOUNTER — Ambulatory Visit (INDEPENDENT_AMBULATORY_CARE_PROVIDER_SITE_OTHER): Payer: Medicare HMO | Admitting: *Deleted

## 2015-04-24 ENCOUNTER — Ambulatory Visit
Admission: RE | Admit: 2015-04-24 | Discharge: 2015-04-24 | Disposition: A | Discharge status: Home / Self Care | Payer: Medicare HMO | Source: Ambulatory Visit | Attending: Pulmonary Disease | Admitting: Pulmonary Disease

## 2015-04-24 ENCOUNTER — Other Ambulatory Visit: Payer: Self-pay | Admitting: Pulmonary Disease

## 2015-04-24 DIAGNOSIS — I482 Chronic atrial fibrillation, unspecified: Secondary | ICD-10-CM

## 2015-04-24 DIAGNOSIS — T148XXA Other injury of unspecified body region, initial encounter: Secondary | ICD-10-CM

## 2015-04-24 DIAGNOSIS — I4891 Unspecified atrial fibrillation: Secondary | ICD-10-CM

## 2015-04-24 DIAGNOSIS — Z5181 Encounter for therapeutic drug level monitoring: Secondary | ICD-10-CM | POA: Diagnosis not present

## 2015-04-24 LAB — POCT INR: INR: 3

## 2015-05-08 ENCOUNTER — Other Ambulatory Visit: Payer: Self-pay | Admitting: Adult Health

## 2015-05-08 ENCOUNTER — Other Ambulatory Visit: Payer: Self-pay | Admitting: Interventional Cardiology

## 2015-05-21 ENCOUNTER — Ambulatory Visit (INDEPENDENT_AMBULATORY_CARE_PROVIDER_SITE_OTHER): Payer: Medicare HMO | Admitting: *Deleted

## 2015-05-21 DIAGNOSIS — Z5181 Encounter for therapeutic drug level monitoring: Secondary | ICD-10-CM

## 2015-05-21 DIAGNOSIS — I482 Chronic atrial fibrillation, unspecified: Secondary | ICD-10-CM

## 2015-05-21 DIAGNOSIS — I4891 Unspecified atrial fibrillation: Secondary | ICD-10-CM | POA: Diagnosis not present

## 2015-05-21 LAB — POCT INR: INR: 2.2

## 2015-06-18 ENCOUNTER — Ambulatory Visit (INDEPENDENT_AMBULATORY_CARE_PROVIDER_SITE_OTHER): Payer: Medicare HMO | Admitting: *Deleted

## 2015-06-18 DIAGNOSIS — I482 Chronic atrial fibrillation, unspecified: Secondary | ICD-10-CM

## 2015-06-18 DIAGNOSIS — I4891 Unspecified atrial fibrillation: Secondary | ICD-10-CM | POA: Diagnosis not present

## 2015-06-18 DIAGNOSIS — Z5181 Encounter for therapeutic drug level monitoring: Secondary | ICD-10-CM | POA: Diagnosis not present

## 2015-06-18 LAB — POCT INR: INR: 3

## 2015-06-19 ENCOUNTER — Ambulatory Visit (INDEPENDENT_AMBULATORY_CARE_PROVIDER_SITE_OTHER): Payer: Medicare HMO | Admitting: *Deleted

## 2015-06-19 DIAGNOSIS — I5022 Chronic systolic (congestive) heart failure: Secondary | ICD-10-CM | POA: Diagnosis not present

## 2015-06-19 DIAGNOSIS — Z95810 Presence of automatic (implantable) cardiac defibrillator: Secondary | ICD-10-CM | POA: Diagnosis not present

## 2015-06-19 NOTE — Progress Notes (Signed)
Remote ICD transmission.   

## 2015-06-20 LAB — CUP PACEART REMOTE DEVICE CHECK
Battery Remaining Longevity: 20 mo
Battery Remaining Percentage: 27 %
Battery Voltage: 2.83 V
Brady Statistic AP VP Percent: 98 %
Brady Statistic AP VS Percent: 1 %
Brady Statistic AS VP Percent: 1.5 %
Brady Statistic AS VS Percent: 1 %
Brady Statistic RA Percent Paced: 98 %
Date Time Interrogation Session: 20161006082459
HighPow Impedance: 55 Ohm
Lead Channel Impedance Value: 380 Ohm
Lead Channel Impedance Value: 690 Ohm
Lead Channel Impedance Value: 730 Ohm
Lead Channel Pacing Threshold Amplitude: 0.5 V
Lead Channel Pacing Threshold Amplitude: 0.875 V
Lead Channel Pacing Threshold Amplitude: 1.75 V
Lead Channel Pacing Threshold Pulse Width: 0.5 ms
Lead Channel Pacing Threshold Pulse Width: 0.8 ms
Lead Channel Pacing Threshold Pulse Width: 1 ms
Lead Channel Sensing Intrinsic Amplitude: 0.8 mV
Lead Channel Sensing Intrinsic Amplitude: 12 mV
Lead Channel Setting Pacing Amplitude: 2 V
Lead Channel Setting Pacing Amplitude: 2 V
Lead Channel Setting Pacing Amplitude: 3.25 V
Lead Channel Setting Pacing Pulse Width: 0.5 ms
Lead Channel Setting Pacing Pulse Width: 0.8 ms
Lead Channel Setting Sensing Sensitivity: 0.5 mV
Pulse Gen Serial Number: 607910
Zone Setting Detection Interval: 330 ms

## 2015-06-25 ENCOUNTER — Encounter: Payer: Self-pay | Admitting: Cardiology

## 2015-07-04 ENCOUNTER — Encounter: Payer: Self-pay | Admitting: Internal Medicine

## 2015-07-30 ENCOUNTER — Ambulatory Visit (INDEPENDENT_AMBULATORY_CARE_PROVIDER_SITE_OTHER): Payer: Medicare HMO | Admitting: *Deleted

## 2015-07-30 DIAGNOSIS — I4891 Unspecified atrial fibrillation: Secondary | ICD-10-CM

## 2015-07-30 DIAGNOSIS — I482 Chronic atrial fibrillation, unspecified: Secondary | ICD-10-CM

## 2015-07-30 DIAGNOSIS — Z5181 Encounter for therapeutic drug level monitoring: Secondary | ICD-10-CM | POA: Diagnosis not present

## 2015-07-30 LAB — POCT INR: INR: 3

## 2015-09-01 ENCOUNTER — Other Ambulatory Visit: Payer: Self-pay | Admitting: Interventional Cardiology

## 2015-09-01 ENCOUNTER — Other Ambulatory Visit: Payer: Self-pay | Admitting: Adult Health

## 2015-09-10 ENCOUNTER — Ambulatory Visit (INDEPENDENT_AMBULATORY_CARE_PROVIDER_SITE_OTHER): Payer: Medicare HMO | Admitting: *Deleted

## 2015-09-10 DIAGNOSIS — I482 Chronic atrial fibrillation, unspecified: Secondary | ICD-10-CM

## 2015-09-10 DIAGNOSIS — Z5181 Encounter for therapeutic drug level monitoring: Secondary | ICD-10-CM

## 2015-09-10 DIAGNOSIS — I4891 Unspecified atrial fibrillation: Secondary | ICD-10-CM | POA: Diagnosis not present

## 2015-09-10 LAB — POCT INR: INR: 2

## 2015-09-18 ENCOUNTER — Ambulatory Visit (INDEPENDENT_AMBULATORY_CARE_PROVIDER_SITE_OTHER): Payer: Medicare HMO | Admitting: *Deleted

## 2015-09-18 DIAGNOSIS — I5022 Chronic systolic (congestive) heart failure: Secondary | ICD-10-CM | POA: Diagnosis not present

## 2015-09-18 DIAGNOSIS — Z95810 Presence of automatic (implantable) cardiac defibrillator: Secondary | ICD-10-CM

## 2015-09-19 NOTE — Progress Notes (Signed)
Remote ICD transmission.   

## 2015-09-25 IMAGING — CR DG ORBITS FOR FOREIGN BODY
2 series · 2 of 2 positions shown · non-contrast
Comparison: None.

CLINICAL DATA: Metal working/exposure; clearance prior to MRI

EXAM:
ORBITS FOR FOREIGN BODY - 2 VIEW

[view not recorded (1 of 2)]
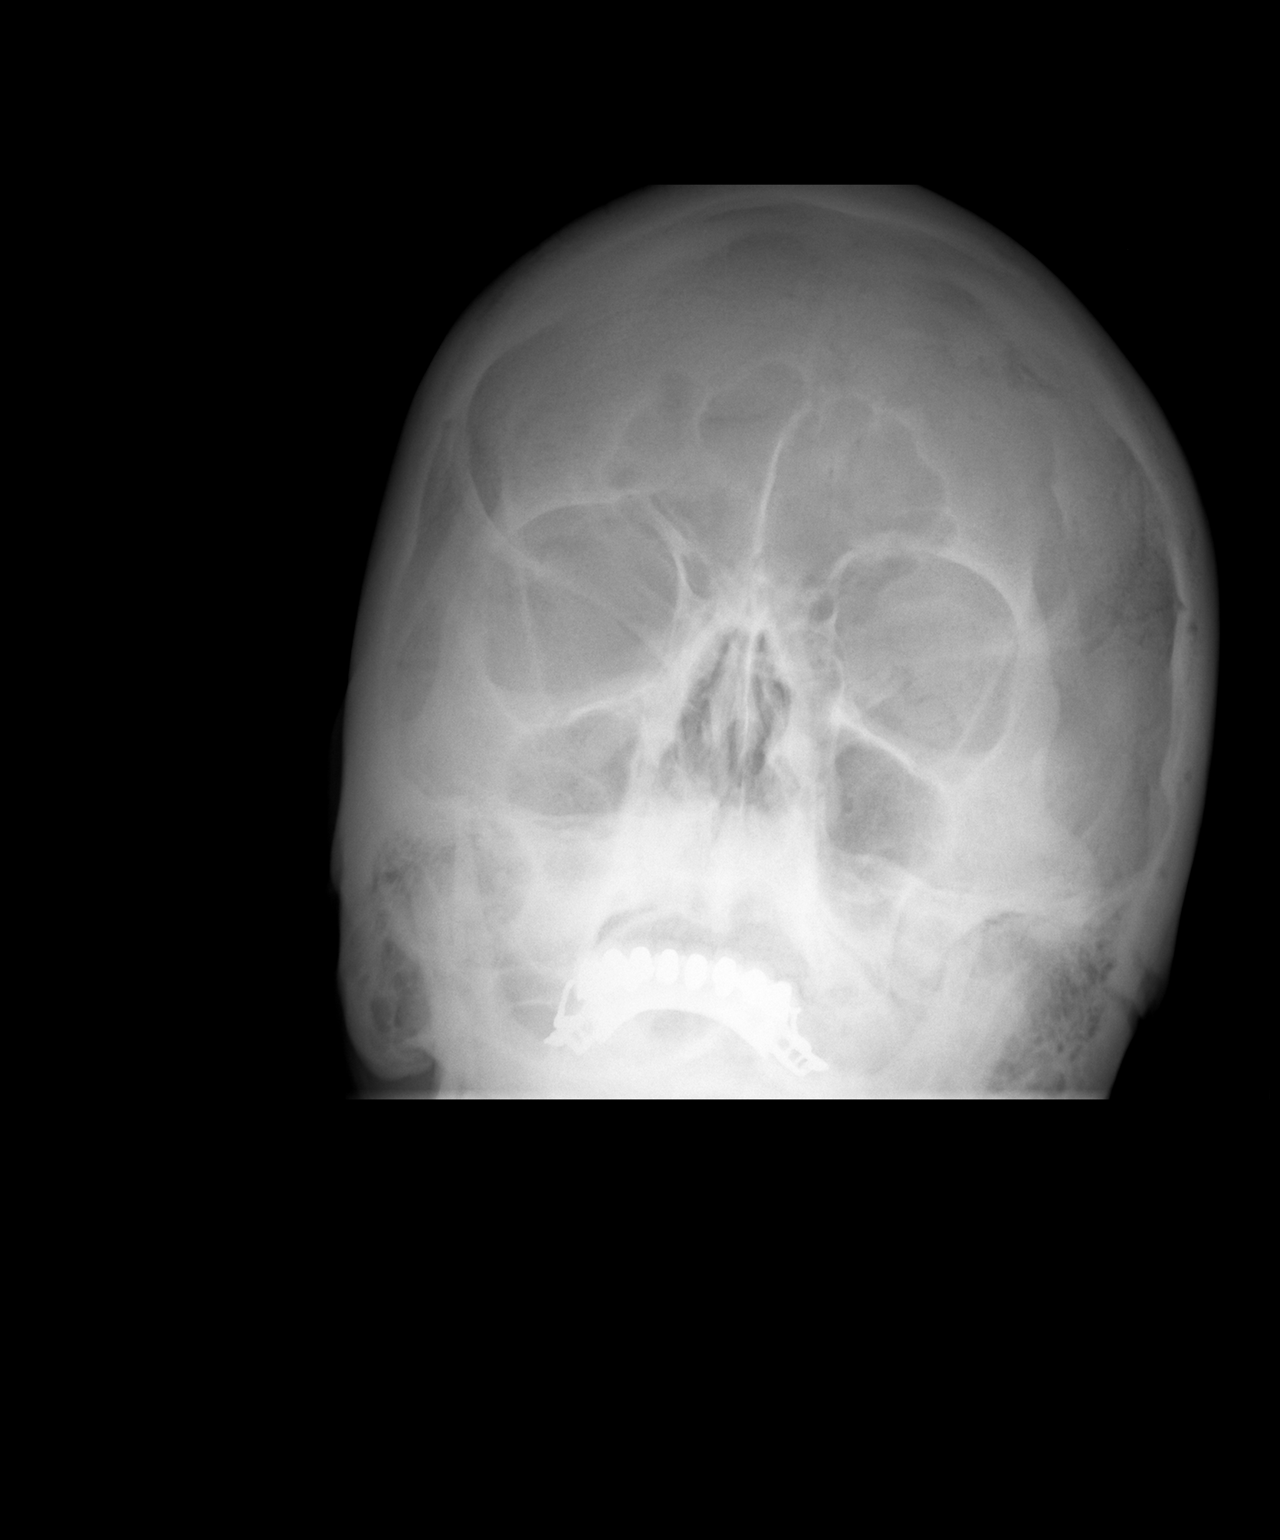

[view not recorded (2 of 2)]
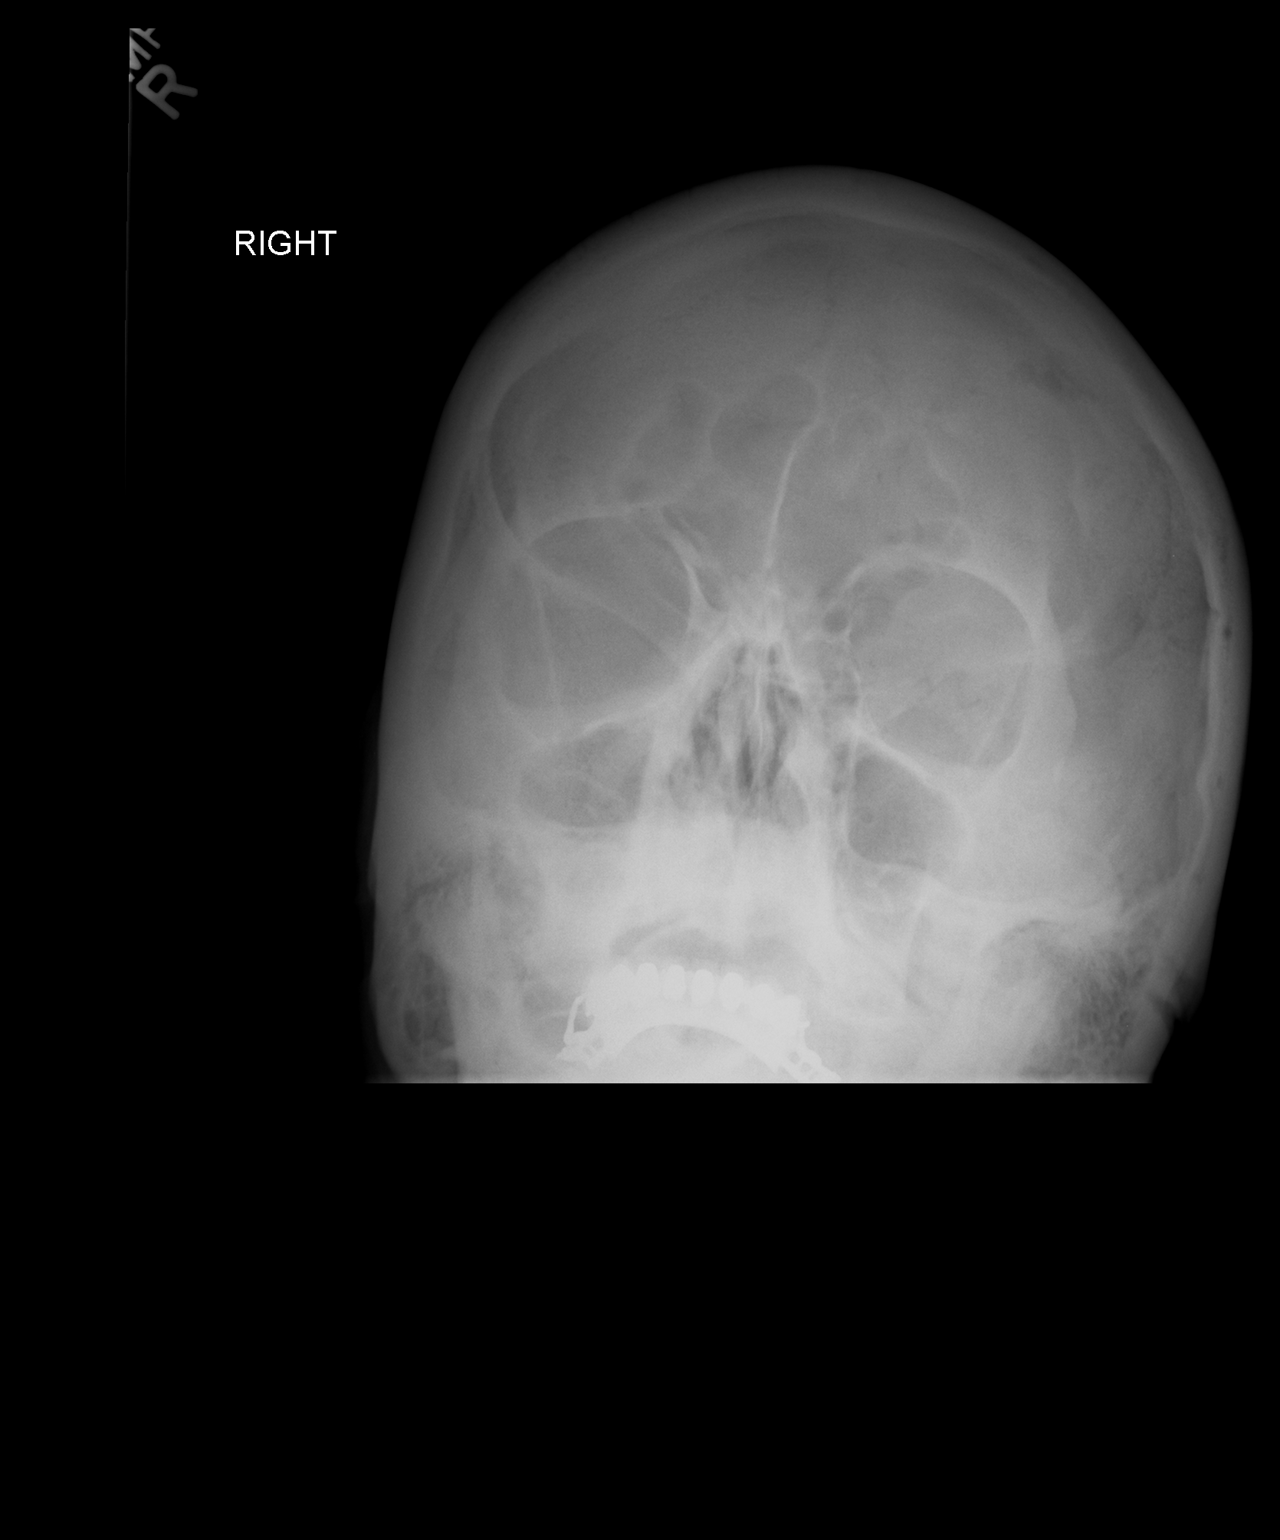

[2 of 2 positions shown; findings below may reference images not displayed]

FINDINGS: There is no evidence of metallic foreign body within the orbits. No
significant bone abnormality identified.
IMPRESSION: No radiopaque foreign bodies noted. Patient has been cleared for
MRI.

## 2015-10-07 LAB — CUP PACEART REMOTE DEVICE CHECK
Battery Remaining Longevity: 19 mo
Battery Remaining Percentage: 25 %
Brady Statistic AS VS Percent: 1 %
HighPow Impedance: 49 Ohm
Implantable Lead Implant Date: 20110211
Implantable Lead Implant Date: 20110211
Implantable Lead Location: 753859
Implantable Lead Model: 4196
Implantable Lead Model: 7121
Lead Channel Impedance Value: 380 Ohm
Lead Channel Pacing Threshold Amplitude: 1.125 V
Lead Channel Pacing Threshold Pulse Width: 0.5 ms
Lead Channel Pacing Threshold Pulse Width: 0.8 ms
Lead Channel Pacing Threshold Pulse Width: 1 ms
Lead Channel Sensing Intrinsic Amplitude: 2.5 mV
Lead Channel Setting Pacing Amplitude: 2 V
Lead Channel Setting Pacing Amplitude: 2.125
Lead Channel Setting Pacing Amplitude: 2.125
Lead Channel Setting Pacing Pulse Width: 0.5 ms
Lead Channel Setting Pacing Pulse Width: 0.8 ms
MDC IDC LEAD IMPLANT DT: 20110211
MDC IDC LEAD LOCATION: 753858
MDC IDC LEAD LOCATION: 753860
MDC IDC MSMT BATTERY VOLTAGE: 2.81 V
MDC IDC MSMT LEADCHNL LV IMPEDANCE VALUE: 680 Ohm
MDC IDC MSMT LEADCHNL LV PACING THRESHOLD AMPLITUDE: 1.125 V
MDC IDC MSMT LEADCHNL RV IMPEDANCE VALUE: 690 Ohm
MDC IDC MSMT LEADCHNL RV PACING THRESHOLD AMPLITUDE: 0.5 V
MDC IDC MSMT LEADCHNL RV SENSING INTR AMPL: 12 mV
MDC IDC PG SERIAL: 607910
MDC IDC SESS DTM: 20170105105121
MDC IDC SET LEADCHNL RV SENSING SENSITIVITY: 0.5 mV
MDC IDC STAT BRADY AP VP PERCENT: 96 %
MDC IDC STAT BRADY AP VS PERCENT: 1 %
MDC IDC STAT BRADY AS VP PERCENT: 3.6 %
MDC IDC STAT BRADY RA PERCENT PACED: 96 %

## 2015-10-10 ENCOUNTER — Encounter: Payer: Self-pay | Admitting: Cardiology

## 2015-10-22 ENCOUNTER — Ambulatory Visit (INDEPENDENT_AMBULATORY_CARE_PROVIDER_SITE_OTHER): Payer: Medicare HMO | Admitting: *Deleted

## 2015-10-22 DIAGNOSIS — I482 Chronic atrial fibrillation, unspecified: Secondary | ICD-10-CM

## 2015-10-22 DIAGNOSIS — Z5181 Encounter for therapeutic drug level monitoring: Secondary | ICD-10-CM

## 2015-10-22 DIAGNOSIS — I4891 Unspecified atrial fibrillation: Secondary | ICD-10-CM | POA: Diagnosis not present

## 2015-10-22 LAB — POCT INR: INR: 1.8

## 2015-11-05 ENCOUNTER — Ambulatory Visit (INDEPENDENT_AMBULATORY_CARE_PROVIDER_SITE_OTHER): Payer: Medicare HMO | Admitting: *Deleted

## 2015-11-05 DIAGNOSIS — I4891 Unspecified atrial fibrillation: Secondary | ICD-10-CM | POA: Diagnosis not present

## 2015-11-05 DIAGNOSIS — Z5181 Encounter for therapeutic drug level monitoring: Secondary | ICD-10-CM | POA: Diagnosis not present

## 2015-11-05 DIAGNOSIS — I482 Chronic atrial fibrillation, unspecified: Secondary | ICD-10-CM

## 2015-11-05 LAB — POCT INR: INR: 2.1

## 2015-11-26 ENCOUNTER — Ambulatory Visit (INDEPENDENT_AMBULATORY_CARE_PROVIDER_SITE_OTHER): Payer: Medicare HMO | Admitting: *Deleted

## 2015-11-26 DIAGNOSIS — I4891 Unspecified atrial fibrillation: Secondary | ICD-10-CM

## 2015-11-26 DIAGNOSIS — I482 Chronic atrial fibrillation, unspecified: Secondary | ICD-10-CM

## 2015-11-26 DIAGNOSIS — Z5181 Encounter for therapeutic drug level monitoring: Secondary | ICD-10-CM | POA: Diagnosis not present

## 2015-11-26 LAB — POCT INR: INR: 2.2

## 2015-12-18 ENCOUNTER — Ambulatory Visit (INDEPENDENT_AMBULATORY_CARE_PROVIDER_SITE_OTHER): Payer: Medicare HMO | Admitting: *Deleted

## 2015-12-18 DIAGNOSIS — Z95810 Presence of automatic (implantable) cardiac defibrillator: Secondary | ICD-10-CM

## 2015-12-18 DIAGNOSIS — I5022 Chronic systolic (congestive) heart failure: Secondary | ICD-10-CM | POA: Diagnosis not present

## 2015-12-18 NOTE — Progress Notes (Signed)
Remote ICD transmission.   

## 2015-12-24 ENCOUNTER — Ambulatory Visit (INDEPENDENT_AMBULATORY_CARE_PROVIDER_SITE_OTHER): Payer: Medicare HMO | Admitting: Pharmacist

## 2015-12-24 DIAGNOSIS — I482 Chronic atrial fibrillation, unspecified: Secondary | ICD-10-CM

## 2015-12-24 DIAGNOSIS — Z5181 Encounter for therapeutic drug level monitoring: Secondary | ICD-10-CM

## 2015-12-24 DIAGNOSIS — I4891 Unspecified atrial fibrillation: Secondary | ICD-10-CM | POA: Diagnosis not present

## 2015-12-24 LAB — POCT INR: INR: 1.9

## 2016-01-12 ENCOUNTER — Other Ambulatory Visit: Payer: Self-pay | Admitting: Interventional Cardiology

## 2016-01-12 ENCOUNTER — Other Ambulatory Visit: Payer: Self-pay | Admitting: Adult Health

## 2016-01-16 ENCOUNTER — Other Ambulatory Visit: Payer: Self-pay | Admitting: *Deleted

## 2016-01-16 MED ORDER — WARFARIN SODIUM 3 MG PO TABS: ORAL_TABLET | ORAL | Status: DC

## 2016-01-16 NOTE — Telephone Encounter (Signed)
Per message left on refill voicemail, patient is out of warfarin as his mail order has not arrived.

## 2016-01-22 ENCOUNTER — Ambulatory Visit (INDEPENDENT_AMBULATORY_CARE_PROVIDER_SITE_OTHER): Payer: Medicare HMO | Admitting: *Deleted

## 2016-01-22 DIAGNOSIS — I482 Chronic atrial fibrillation, unspecified: Secondary | ICD-10-CM

## 2016-01-22 DIAGNOSIS — Z5181 Encounter for therapeutic drug level monitoring: Secondary | ICD-10-CM

## 2016-01-22 DIAGNOSIS — I4891 Unspecified atrial fibrillation: Secondary | ICD-10-CM | POA: Diagnosis not present

## 2016-01-22 LAB — POCT INR: INR: 1.5

## 2016-02-02 ENCOUNTER — Ambulatory Visit (INDEPENDENT_AMBULATORY_CARE_PROVIDER_SITE_OTHER): Payer: Medicare HMO | Admitting: Pharmacist

## 2016-02-02 DIAGNOSIS — I482 Chronic atrial fibrillation, unspecified: Secondary | ICD-10-CM

## 2016-02-02 DIAGNOSIS — Z5181 Encounter for therapeutic drug level monitoring: Secondary | ICD-10-CM | POA: Diagnosis not present

## 2016-02-02 DIAGNOSIS — I4891 Unspecified atrial fibrillation: Secondary | ICD-10-CM

## 2016-02-02 LAB — POCT INR: INR: 1.9

## 2016-02-05 ENCOUNTER — Encounter: Payer: Self-pay | Admitting: Cardiology

## 2016-02-05 LAB — CUP PACEART REMOTE DEVICE CHECK
Battery Remaining Percentage: 22 %
Battery Voltage: 2.78 V
Brady Statistic AP VP Percent: 95 %
Brady Statistic AS VP Percent: 4.2 %
Brady Statistic AS VS Percent: 1 %
HIGH POWER IMPEDANCE MEASURED VALUE: 53 Ohm
Implantable Lead Implant Date: 20110211
Implantable Lead Implant Date: 20110211
Implantable Lead Location: 753858
Implantable Lead Location: 753860
Implantable Lead Model: 7121
Lead Channel Impedance Value: 340 Ohm
Lead Channel Impedance Value: 680 Ohm
Lead Channel Impedance Value: 700 Ohm
Lead Channel Pacing Threshold Pulse Width: 1 ms
Lead Channel Sensing Intrinsic Amplitude: 12 mV
Lead Channel Setting Pacing Amplitude: 2 V
MDC IDC LEAD IMPLANT DT: 20110211
MDC IDC LEAD LOCATION: 753859
MDC IDC LEAD MODEL: 4196
MDC IDC MSMT BATTERY REMAINING LONGEVITY: 17 mo
MDC IDC MSMT LEADCHNL LV PACING THRESHOLD AMPLITUDE: 1 V
MDC IDC MSMT LEADCHNL LV PACING THRESHOLD PULSEWIDTH: 0.8 ms
MDC IDC MSMT LEADCHNL RA PACING THRESHOLD AMPLITUDE: 1 V
MDC IDC MSMT LEADCHNL RA SENSING INTR AMPL: 2.7 mV
MDC IDC MSMT LEADCHNL RV PACING THRESHOLD AMPLITUDE: 0.5 V
MDC IDC MSMT LEADCHNL RV PACING THRESHOLD PULSEWIDTH: 0.5 ms
MDC IDC PG SERIAL: 607910
MDC IDC SESS DTM: 20170406091731
MDC IDC SET LEADCHNL LV PACING PULSEWIDTH: 0.8 ms
MDC IDC SET LEADCHNL RA PACING AMPLITUDE: 2 V
MDC IDC SET LEADCHNL RV PACING AMPLITUDE: 2 V
MDC IDC SET LEADCHNL RV PACING PULSEWIDTH: 0.5 ms
MDC IDC SET LEADCHNL RV SENSING SENSITIVITY: 0.5 mV
MDC IDC STAT BRADY AP VS PERCENT: 1 %
MDC IDC STAT BRADY RA PERCENT PACED: 95 %

## 2016-02-18 ENCOUNTER — Ambulatory Visit (INDEPENDENT_AMBULATORY_CARE_PROVIDER_SITE_OTHER): Payer: Medicare HMO | Admitting: *Deleted

## 2016-02-18 DIAGNOSIS — I4891 Unspecified atrial fibrillation: Secondary | ICD-10-CM

## 2016-02-18 DIAGNOSIS — Z5181 Encounter for therapeutic drug level monitoring: Secondary | ICD-10-CM

## 2016-02-18 DIAGNOSIS — I482 Chronic atrial fibrillation, unspecified: Secondary | ICD-10-CM

## 2016-02-18 LAB — POCT INR: INR: 2.6

## 2016-03-05 ENCOUNTER — Other Ambulatory Visit: Payer: Self-pay | Admitting: Interventional Cardiology

## 2016-03-10 ENCOUNTER — Ambulatory Visit (INDEPENDENT_AMBULATORY_CARE_PROVIDER_SITE_OTHER): Payer: Medicare HMO | Admitting: Pharmacist

## 2016-03-10 DIAGNOSIS — I4891 Unspecified atrial fibrillation: Secondary | ICD-10-CM

## 2016-03-10 DIAGNOSIS — I482 Chronic atrial fibrillation, unspecified: Secondary | ICD-10-CM

## 2016-03-10 DIAGNOSIS — Z5181 Encounter for therapeutic drug level monitoring: Secondary | ICD-10-CM

## 2016-03-10 LAB — POCT INR: INR: 2.9

## 2016-03-22 ENCOUNTER — Other Ambulatory Visit: Payer: Self-pay | Admitting: Interventional Cardiology

## 2016-03-23 ENCOUNTER — Other Ambulatory Visit: Payer: Self-pay | Admitting: *Deleted

## 2016-03-23 MED ORDER — ATORVASTATIN CALCIUM 40 MG PO TABS: 40.0000 mg | ORAL_TABLET | Freq: Every day | ORAL | Status: DC

## 2016-03-25 ENCOUNTER — Encounter: Payer: Self-pay | Admitting: Internal Medicine

## 2016-03-25 ENCOUNTER — Ambulatory Visit (INDEPENDENT_AMBULATORY_CARE_PROVIDER_SITE_OTHER): Payer: Medicare HMO | Admitting: Internal Medicine

## 2016-03-25 VITALS — BP 98/70 | HR 64 | Ht 69.0 in | Wt 165.4 lb

## 2016-03-25 DIAGNOSIS — I5022 Chronic systolic (congestive) heart failure: Secondary | ICD-10-CM | POA: Diagnosis not present

## 2016-03-25 DIAGNOSIS — I480 Paroxysmal atrial fibrillation: Secondary | ICD-10-CM

## 2016-03-25 LAB — CUP PACEART INCLINIC DEVICE CHECK
Brady Statistic RA Percent Paced: 94 %
Brady Statistic RV Percent Paced: 99 %
HighPow Impedance: 49.5152
Implantable Lead Implant Date: 20110211
Implantable Lead Implant Date: 20110211
Implantable Lead Location: 753858
Implantable Lead Location: 753859
Implantable Lead Model: 7121
Lead Channel Impedance Value: 362.5 Ohm
Lead Channel Impedance Value: 687.5 Ohm
Lead Channel Pacing Threshold Amplitude: 0.5 V
Lead Channel Pacing Threshold Amplitude: 1 V
Lead Channel Pacing Threshold Amplitude: 1 V
Lead Channel Pacing Threshold Pulse Width: 0.5 ms
Lead Channel Pacing Threshold Pulse Width: 0.5 ms
Lead Channel Pacing Threshold Pulse Width: 1 ms
Lead Channel Setting Pacing Amplitude: 2 V
Lead Channel Setting Pacing Pulse Width: 0.5 ms
MDC IDC LEAD IMPLANT DT: 20110211
MDC IDC LEAD LOCATION: 753860
MDC IDC LEAD MODEL: 4196
MDC IDC MSMT BATTERY REMAINING LONGEVITY: 12 mo
MDC IDC MSMT LEADCHNL LV PACING THRESHOLD AMPLITUDE: 1 V
MDC IDC MSMT LEADCHNL LV PACING THRESHOLD PULSEWIDTH: 0.8 ms
MDC IDC MSMT LEADCHNL LV PACING THRESHOLD PULSEWIDTH: 0.8 ms
MDC IDC MSMT LEADCHNL RA PACING THRESHOLD AMPLITUDE: 1 V
MDC IDC MSMT LEADCHNL RA PACING THRESHOLD PULSEWIDTH: 1 ms
MDC IDC MSMT LEADCHNL RA SENSING INTR AMPL: 1 mV
MDC IDC MSMT LEADCHNL RV IMPEDANCE VALUE: 675 Ohm
MDC IDC MSMT LEADCHNL RV PACING THRESHOLD AMPLITUDE: 0.5 V
MDC IDC MSMT LEADCHNL RV SENSING INTR AMPL: 12 mV
MDC IDC SESS DTM: 20170713153214
MDC IDC SET LEADCHNL LV PACING PULSEWIDTH: 0.8 ms
MDC IDC SET LEADCHNL RA PACING AMPLITUDE: 2.375
MDC IDC SET LEADCHNL RV PACING AMPLITUDE: 2 V
MDC IDC SET LEADCHNL RV SENSING SENSITIVITY: 0.5 mV
Pulse Gen Serial Number: 607910

## 2016-03-25 MED ORDER — CARVEDILOL 6.25 MG PO TABS: 3.1250 mg | ORAL_TABLET | Freq: Two times a day (BID) | ORAL | Status: DC

## 2016-03-25 NOTE — Progress Notes (Signed)
HPI Mr. Boening returns today for followup. He is a very pleasant 82 year old man with a nonischemic cardiomyopathy, chronic systolic heart failure, left bundle branch block, status post biventricular ICD implantation, and atrial fibrillation. He has done well in the interim. He denies chest pain, shortness of breath, syncope, or any ICD shock. No peripheral edema. No ICD shock. He does have a little dizziness. No other complaints.   No Known Allergies   Current Outpatient Prescriptions  Medication Sig Dispense Refill  . ALLOPURINOL PO Take 1 tablet by mouth daily. Pt unsure of strength    . amiodarone (PACERONE) 200 MG tablet Take 200 mg by mouth daily.    [name] atorvastatin (LIPITOR) 40 MG tablet Take 1 tablet (40 mg total) by mouth daily. 30 tablet 1  . carvedilol (COREG) 6.25 MG tablet Take 6.25 mg by mouth 2 (two) times daily with a meal.    . furosemide (LASIX) 40 MG tablet Take 40 mg by mouth daily.    . potassium chloride SA (K-DUR,KLOR-CON) 20 MEQ tablet Take 1 tablet (20 mEq total) by mouth daily. 90 tablet 0  . simvastatin (ZOCOR) 20 MG tablet Take 20 mg by mouth daily.    [name] spironolactone (ALDACTONE) 25 MG tablet Takes1/2 tablet (0.5) mg by mouth daily 90 tablet 1  . triamcinolone cream (KENALOG) 0.1 % Apply 1 application to affected area twice a day  0  . warfarin (COUMADIN) 3 MG tablet TAKE 1 TABLET DAILY OR AS DIRECTED BY COUMADIN CLINIC. 40 tablet 0   No current facility-administered medications for this visit.     Past Medical History  Diagnosis Date  . Renal failure     acute  . Atrial fibrillation (HCC)   . Chronic systolic heart failure (HCC)   . ICD-St.Jude   . Hyperlipidemia     ROS:   All systems reviewed and negative except as noted in the HPI.   Past Surgical History  Procedure Laterality Date  . Cardiac defibrillator placement    . Polypectomy       Family History  Problem Relation Age of Onset  . Other      notable for CHF  . Heart failure Mother    . Hypertension Mother   . Heart failure Father   . Hypertension Father      Social History   Social History  . Marital Status: Married    Spouse Name: N/A  . Number of Children: 2  . Years of Education: N/A   Occupational History  . retired Environmental manager    Social History Main Topics  . Smoking status: Never Smoker   . Smokeless tobacco: Not on file  . Alcohol Use: No  . Drug Use: No  . Sexual Activity: Not on file   Other Topics Concern  . Not on file   Social History Narrative     BP 98/70 mmHg  Pulse 64  Ht  (1.753 m)  Wt 165 lb 6.4 oz (75.025 kg)  BMI 24.41 kg/m2  Physical Exam:  Well appearing 82 year old man, NAD HEENT: Unremarkable Neck:  7 cm JVD, no thyromegally Back:  No CVA tenderness Lungs:  Clear with no wheezes, rales, or rhonchi. HEART:  Regular rate rhythm, no murmurs, no rubs, no clicks Abd:  soft, positive bowel sounds, no organomegally, no rebound, no guarding Ext:  2 plus pulses, no edema, no cyanosis, no clubbing Skin:  No rashes no nodules Neuro:  CN II through XII intact, motor grossly intact  DEVICE  Normal device function.  See PaceArt for details.   Assess/Plan: 1. Chronic systolic heart failure - his blood pressure is low. He will reduce his dose of coreg. 2. ICD - his BiV ICD is working normally. Will recheck in several months. 3. Atrial fib - he appears to be maintaining NSR over 95% of the time. We will follow.  Tyler Gray.D.

## 2016-03-25 NOTE — Patient Instructions (Signed)
Medication Instructions:  Your physician has recommended you make the following change in your medication:  1) Decrease Carvedilol to 3.125 mg twice daily   Labwork: None ordered   Testing/Procedures: None ordered   Follow-Up: Your physician wants you to follow-up in: 12 months with Dr Court Joy will receive a reminder letter in the mail two months in advance. If you don't receive a letter, please call our office to schedule the follow-up appointment.  Remote monitoring is used to monitor your  ICD from home. This monitoring reduces the number of office visits required to check your device to one time per year. It allows [place] to keep an eye on the functioning of your device to ensure it is working properly. You are scheduled for a device check from home on 06/24/16. You may send your transmission at any time that day. If you have a wireless device, the transmission will be sent automatically. After your physician reviews your transmission, you will receive a postcard with your next transmission date.     Any Other Special Instructions Will Be Listed Below (If Applicable).     If you need a refill on your cardiac medications before your next appointment, please call your pharmacy.

## 2016-04-07 ENCOUNTER — Ambulatory Visit (INDEPENDENT_AMBULATORY_CARE_PROVIDER_SITE_OTHER): Payer: Medicare HMO | Admitting: *Deleted

## 2016-04-07 DIAGNOSIS — Z5181 Encounter for therapeutic drug level monitoring: Secondary | ICD-10-CM | POA: Diagnosis not present

## 2016-04-07 DIAGNOSIS — I4891 Unspecified atrial fibrillation: Secondary | ICD-10-CM

## 2016-04-07 LAB — POCT INR: INR: 2.4

## 2016-04-27 ENCOUNTER — Ambulatory Visit (INDEPENDENT_AMBULATORY_CARE_PROVIDER_SITE_OTHER): Payer: Medicare HMO | Admitting: Interventional Cardiology

## 2016-04-27 ENCOUNTER — Ambulatory Visit (INDEPENDENT_AMBULATORY_CARE_PROVIDER_SITE_OTHER): Payer: Medicare HMO | Admitting: *Deleted

## 2016-04-27 ENCOUNTER — Ambulatory Visit
Admission: RE | Admit: 2016-04-27 | Discharge: 2016-04-27 | Disposition: A | Discharge status: Home / Self Care | Payer: Medicare HMO | Source: Ambulatory Visit | Attending: Interventional Cardiology | Admitting: Interventional Cardiology

## 2016-04-27 ENCOUNTER — Encounter: Payer: Self-pay | Admitting: Interventional Cardiology

## 2016-04-27 VITALS — BP 100/68 | HR 60 | Ht 67.0 in | Wt 169.0 lb

## 2016-04-27 DIAGNOSIS — Z79899 Other long term (current) drug therapy: Secondary | ICD-10-CM

## 2016-04-27 DIAGNOSIS — Z5181 Encounter for therapeutic drug level monitoring: Secondary | ICD-10-CM

## 2016-04-27 DIAGNOSIS — I5022 Chronic systolic (congestive) heart failure: Secondary | ICD-10-CM | POA: Diagnosis not present

## 2016-04-27 DIAGNOSIS — I480 Paroxysmal atrial fibrillation: Secondary | ICD-10-CM

## 2016-04-27 DIAGNOSIS — I4891 Unspecified atrial fibrillation: Secondary | ICD-10-CM

## 2016-04-27 DIAGNOSIS — N184 Chronic kidney disease, stage 4 (severe): Secondary | ICD-10-CM

## 2016-04-27 DIAGNOSIS — Z95810 Presence of automatic (implantable) cardiac defibrillator: Secondary | ICD-10-CM

## 2016-04-27 LAB — POCT INR: INR: 2.3

## 2016-04-27 NOTE — Progress Notes (Signed)
Cardiology Office Note    Date:  04/27/2016   ID:  [name], DOB 1933/07/05, MRN [id number]  PCP:  Eino Farber, MD  Cardiologist: Lesleigh Noe, MD   Chief Complaint  Patient presents with  . Congestive Heart Failure    History of Present Illness:  Tyler Gray is a 82 y.o. male follow-up of nonischemic cardiomyopathy, paroxysmal atrial fibrillation on amiodarone for rhythm control, chronic systolic heart failure, AICD, hyperlipidemia, and chronic kidney disease stage III  [name] is doing well. He has some dizziness that is more apparent with change in posture from sitting to standing. He denies dyspnea and is able to lie flat. No lower extremity swelling or chest pain has been noted. He recently saw Dr. Ladona Ridgel and had device check. He is on chronic amiodarone therapy for atrial fibrillation suppression..  Past Medical History:  Diagnosis Date  . Atrial fibrillation (HCC)   . Chronic systolic heart failure (HCC)   . Hyperlipidemia   . ICD-St.Jude   . Renal failure    acute    Past Surgical History:  Procedure Laterality Date  . CARDIAC DEFIBRILLATOR PLACEMENT    . POLYPECTOMY      Current Medications: Outpatient Medications Prior to Visit  Medication Sig Dispense Refill  . ALLOPURINOL PO Take 1 tablet by mouth daily. Pt unsure of strength    . amiodarone (PACERONE) 200 MG tablet Take 200 mg by mouth daily.    [name] atorvastatin (LIPITOR) 40 MG tablet Take 1 tablet (40 mg total) by mouth daily. 30 tablet 1  . carvedilol (COREG) 6.25 MG tablet Take 0.5 tablets (3.125 mg total) by mouth 2 (two) times daily with a meal.    . furosemide (LASIX) 40 MG tablet Take 40 mg by mouth daily.    . potassium chloride SA (K-DUR,KLOR-CON) 20 MEQ tablet Take 1 tablet (20 mEq total) by mouth daily. 90 tablet 0  . simvastatin (ZOCOR) 20 MG tablet Take 20 mg by mouth daily.    [name] spironolactone (ALDACTONE) 25 MG tablet Takes1/2 tablet (0.5) mg by mouth daily 90 tablet  1  . triamcinolone cream (KENALOG) 0.1 % Apply 1 application to affected area twice a day  0  . warfarin (COUMADIN) 3 MG tablet TAKE 1 TABLET DAILY OR AS DIRECTED BY COUMADIN CLINIC. 40 tablet 0   No facility-administered medications prior to visit.      Allergies:   Review of patient's allergies indicates no known allergies.   Social History   Social History  . Marital status: Married    Spouse name: N/A  . Number of children: 2  . Years of education: N/A   Occupational History  . retired Environmental manager    Social History Main Topics  . Smoking status: Never Smoker  . Smokeless tobacco: Never Used  . Alcohol use No  . Drug use: No  . Sexual activity: Not Asked   Other Topics Concern  . None   Social History Narrative  . None     Family History:  The patient's family history includes Heart failure in his father and mother; Hypertension in his father and mother.   ROS:   Please see the history of present illness.    Mild dizziness with change in posture. Otherwise no change  All other systems reviewed and are negative.   PHYSICAL EXAM:   VS:  BP 100/68   Pulse 60   Ht 5\' 7"  (1.702 m)   Wt 169 lb (  76.7 kg)   BMI 26.47 kg/m    GEN: Well nourished, well developed, in no acute distress  HEENT: normal  Neck: no JVD, carotid bruits, or masses Cardiac: RRR; no murmurs, rubs, or gallops,no edema  Respiratory:  clear to auscultation bilaterally, normal work of breathing GI: soft, nontender, nondistended, + BS MS: no deformity or atrophy  Skin: warm and dry, no rash Neuro:  Alert and Oriented x 3, Strength and sensation are intact Psych: euthymic mood, full affect  Wt Readings from Last 3 Encounters:  04/27/16 169 lb (76.7 kg)  03/25/16 165 lb 6.4 oz (75 kg)  03/20/15 157 lb 3.2 oz (71.3 kg)      Studies/Labs Reviewed:   EKG:  EKG  Is not repeated. Performed on 03/25/2016, AV sequential pacing was noted.  Recent Labs: No results found for requested labs  within last 8760 hours.   Lipid Panel No results found for: CHOL, TRIG, HDL, CHOLHDL, VLDL, LDLCALC, LDLDIRECT  Additional studies/ records that were reviewed today include:  No new data.    ASSESSMENT:    1. Chronic atrial fibrillation (HCC)   2. CHRONIC SYSTOLIC HEART FAILURE   3. On amiodarone therapy   4. Encounter for therapeutic drug monitoring   5. Automatic implantable cardioverter-defibrillator in situ   6. Chronic kidney disease, stage IV (severe) (HCC)      PLAN:  In order of problems listed above:  1. He is maintaining sinus rhythm on amiodarone. 2. No evidence of volume overload. No recent assessment of LV function.. Low salt diet is advocated. 3. Amiodarone therapy is being monitored. We will do a PA and lateral chest x-ray, TSH, and hepatic panel today. Clinic follow-up 6 months. 4. No bleeding on Coumadin 5. Normal device function when last evaluated 6. No changes at this time.    Medication Adjustments/Labs and Tests Ordered: Current medicines are reviewed at length with the patient today.  Concerns regarding medicines are outlined above.  Medication changes, Labs and Tests ordered today are listed in the Patient Instructions below. There are no Patient Instructions on file for this visit.   Signed, Lesleigh NoeHenry W Deon Ivey III, MD  04/27/2016 2:06 PM    [facility] [address], [place], [place]  [address] Phone: [phone]; Fax: [phone]

## 2016-04-27 NOTE — Patient Instructions (Addendum)
Medication Instructions:  Your physician recommends that you continue on your current medications as directed. Please refer to the Current Medication list given to you today.   Labwork: Tsh, Hepatic today  Testing/Procedures: A chest x-ray takes a picture of the organs and structures inside the chest, including the heart, lungs, and blood vessels. This test can show several things, including, whether the heart is enlarges; whether fluid is building up in the lungs; and whether pacemaker / defibrillator leads are still in place. ( To be performed at [facility] [address] 1st floor)  Follow-Up: Your physician wants you to follow-up in: 6 months with Dr.Smith You will receive a reminder letter in the mail two months in advance. If you don't receive a letter, please call our office to schedule the follow-up appointment.   Any Other Special Instructions Will Be Listed Below (If Applicable).     If you need a refill on your cardiac medications before your next appointment, please call your pharmacy.

## 2016-04-28 LAB — HEPATIC FUNCTION PANEL
ALK PHOS: 19 U/L — AB (ref 40–115)
ALT: 12 U/L (ref 9–46)
AST: 16 U/L (ref 10–35)
Albumin: 3.4 g/dL — ABNORMAL LOW (ref 3.6–5.1)
BILIRUBIN INDIRECT: 1.2 mg/dL (ref 0.2–1.2)
Bilirubin, Direct: 0.4 mg/dL — ABNORMAL HIGH (ref <=0.2)
TOTAL PROTEIN: 6.9 g/dL (ref 6.1–8.1)
Total Bilirubin: 1.6 mg/dL — ABNORMAL HIGH (ref 0.2–1.2)

## 2016-04-28 LAB — TSH: TSH: 2.71 mIU/L (ref 0.40–4.50)

## 2016-05-09 ENCOUNTER — Encounter: Payer: Self-pay | Admitting: Internal Medicine

## 2016-05-25 ENCOUNTER — Ambulatory Visit (INDEPENDENT_AMBULATORY_CARE_PROVIDER_SITE_OTHER): Payer: Medicare HMO | Admitting: Pharmacist

## 2016-05-25 DIAGNOSIS — I4891 Unspecified atrial fibrillation: Secondary | ICD-10-CM

## 2016-05-25 DIAGNOSIS — Z5181 Encounter for therapeutic drug level monitoring: Secondary | ICD-10-CM | POA: Diagnosis not present

## 2016-05-25 LAB — POCT INR: INR: 2.3

## 2016-06-16 ENCOUNTER — Other Ambulatory Visit: Payer: Self-pay | Admitting: Interventional Cardiology

## 2016-06-24 ENCOUNTER — Encounter: Payer: Medicare HMO | Admitting: *Deleted

## 2016-06-24 ENCOUNTER — Telehealth: Payer: Self-pay | Admitting: Cardiology

## 2016-06-24 NOTE — Telephone Encounter (Signed)
Spoke with pt and reminded pt of remote transmission that is due today. Pt verbalized understanding.   

## 2016-06-25 ENCOUNTER — Encounter: Payer: Self-pay | Admitting: Cardiology

## 2016-07-06 ENCOUNTER — Ambulatory Visit (INDEPENDENT_AMBULATORY_CARE_PROVIDER_SITE_OTHER): Payer: Medicare HMO | Admitting: Pharmacist

## 2016-07-06 DIAGNOSIS — I4891 Unspecified atrial fibrillation: Secondary | ICD-10-CM

## 2016-07-06 DIAGNOSIS — Z5181 Encounter for therapeutic drug level monitoring: Secondary | ICD-10-CM

## 2016-07-06 LAB — POCT INR: INR: 1.7

## 2016-07-27 ENCOUNTER — Ambulatory Visit (INDEPENDENT_AMBULATORY_CARE_PROVIDER_SITE_OTHER): Payer: Medicare HMO | Admitting: *Deleted

## 2016-07-27 DIAGNOSIS — Z5181 Encounter for therapeutic drug level monitoring: Secondary | ICD-10-CM | POA: Diagnosis not present

## 2016-07-27 DIAGNOSIS — I4891 Unspecified atrial fibrillation: Secondary | ICD-10-CM | POA: Diagnosis not present

## 2016-07-27 LAB — POCT INR: INR: 2.4

## 2016-08-24 ENCOUNTER — Ambulatory Visit (INDEPENDENT_AMBULATORY_CARE_PROVIDER_SITE_OTHER): Payer: Medicare HMO | Admitting: *Deleted

## 2016-08-24 DIAGNOSIS — Z5181 Encounter for therapeutic drug level monitoring: Secondary | ICD-10-CM | POA: Diagnosis not present

## 2016-08-24 DIAGNOSIS — I4891 Unspecified atrial fibrillation: Secondary | ICD-10-CM

## 2016-08-24 LAB — POCT INR: INR: 2.3

## 2016-09-21 ENCOUNTER — Ambulatory Visit (INDEPENDENT_AMBULATORY_CARE_PROVIDER_SITE_OTHER): Payer: Medicare PPO | Admitting: *Deleted

## 2016-09-21 DIAGNOSIS — I4891 Unspecified atrial fibrillation: Secondary | ICD-10-CM

## 2016-09-21 DIAGNOSIS — Z5181 Encounter for therapeutic drug level monitoring: Secondary | ICD-10-CM

## 2016-09-21 LAB — POCT INR: INR: 2.2

## 2016-10-18 ENCOUNTER — Other Ambulatory Visit: Payer: Self-pay | Admitting: Interventional Cardiology

## 2016-10-26 ENCOUNTER — Ambulatory Visit (INDEPENDENT_AMBULATORY_CARE_PROVIDER_SITE_OTHER): Payer: Medicare PPO | Admitting: Pharmacist

## 2016-10-26 DIAGNOSIS — I4891 Unspecified atrial fibrillation: Secondary | ICD-10-CM

## 2016-10-26 DIAGNOSIS — Z5181 Encounter for therapeutic drug level monitoring: Secondary | ICD-10-CM

## 2016-10-26 LAB — POCT INR: INR: 3.8

## 2016-11-23 ENCOUNTER — Ambulatory Visit (INDEPENDENT_AMBULATORY_CARE_PROVIDER_SITE_OTHER): Payer: Medicare PPO | Admitting: *Deleted

## 2016-11-23 DIAGNOSIS — I4891 Unspecified atrial fibrillation: Secondary | ICD-10-CM | POA: Diagnosis not present

## 2016-11-23 DIAGNOSIS — Z5181 Encounter for therapeutic drug level monitoring: Secondary | ICD-10-CM

## 2016-11-23 LAB — POCT INR: INR: 2.6

## 2016-12-10 ENCOUNTER — Ambulatory Visit (INDEPENDENT_AMBULATORY_CARE_PROVIDER_SITE_OTHER): Payer: Medicare PPO | Admitting: *Deleted

## 2016-12-10 DIAGNOSIS — I480 Paroxysmal atrial fibrillation: Secondary | ICD-10-CM | POA: Diagnosis not present

## 2016-12-10 NOTE — Progress Notes (Signed)
Remote ICD transmission.   

## 2016-12-13 ENCOUNTER — Encounter: Payer: Self-pay | Admitting: Cardiology

## 2016-12-13 LAB — CUP PACEART REMOTE DEVICE CHECK
Battery Voltage: 2.62 V
Brady Statistic AP VS Percent: 1 %
Brady Statistic AS VP Percent: 11 %
Brady Statistic RA Percent Paced: 83 %
Date Time Interrogation Session: 20180330090934
HighPow Impedance: 51 Ohm
Implantable Lead Implant Date: 20110211
Implantable Lead Implant Date: 20110211
Implantable Lead Location: 753859
Implantable Lead Model: 7121
Lead Channel Pacing Threshold Amplitude: 0.625 V
Lead Channel Pacing Threshold Amplitude: 1 V
Lead Channel Pacing Threshold Pulse Width: 0.5 ms
Lead Channel Pacing Threshold Pulse Width: 0.8 ms
Lead Channel Setting Pacing Amplitude: 2 V
Lead Channel Setting Pacing Pulse Width: 0.5 ms
Lead Channel Setting Pacing Pulse Width: 0.8 ms
Lead Channel Setting Sensing Sensitivity: 0.5 mV
MDC IDC LEAD IMPLANT DT: 20110211
MDC IDC LEAD LOCATION: 753858
MDC IDC LEAD LOCATION: 753860
MDC IDC MSMT BATTERY REMAINING LONGEVITY: 4 mo
MDC IDC MSMT BATTERY REMAINING PERCENTAGE: 4 %
MDC IDC MSMT LEADCHNL LV IMPEDANCE VALUE: 640 Ohm
MDC IDC MSMT LEADCHNL RA IMPEDANCE VALUE: 350 Ohm
MDC IDC MSMT LEADCHNL RA PACING THRESHOLD AMPLITUDE: 1 V
MDC IDC MSMT LEADCHNL RA PACING THRESHOLD PULSEWIDTH: 1 ms
MDC IDC MSMT LEADCHNL RA SENSING INTR AMPL: 1.2 mV
MDC IDC MSMT LEADCHNL RV IMPEDANCE VALUE: 690 Ohm
MDC IDC MSMT LEADCHNL RV SENSING INTR AMPL: 12 mV
MDC IDC PG IMPLANT DT: 20110211
MDC IDC SET LEADCHNL LV PACING AMPLITUDE: 2 V
MDC IDC SET LEADCHNL RV PACING AMPLITUDE: 2 V
MDC IDC STAT BRADY AP VP PERCENT: 84 %
MDC IDC STAT BRADY AS VS PERCENT: 1 %
Pulse Gen Serial Number: 607910

## 2016-12-21 ENCOUNTER — Ambulatory Visit (INDEPENDENT_AMBULATORY_CARE_PROVIDER_SITE_OTHER): Payer: Medicare PPO

## 2016-12-21 DIAGNOSIS — Z5181 Encounter for therapeutic drug level monitoring: Secondary | ICD-10-CM | POA: Diagnosis not present

## 2016-12-21 DIAGNOSIS — I4891 Unspecified atrial fibrillation: Secondary | ICD-10-CM | POA: Diagnosis not present

## 2016-12-21 LAB — POCT INR: INR: 1.6

## 2017-01-04 ENCOUNTER — Ambulatory Visit (INDEPENDENT_AMBULATORY_CARE_PROVIDER_SITE_OTHER): Payer: Medicare PPO | Admitting: *Deleted

## 2017-01-04 DIAGNOSIS — Z5181 Encounter for therapeutic drug level monitoring: Secondary | ICD-10-CM | POA: Diagnosis not present

## 2017-01-04 DIAGNOSIS — I4891 Unspecified atrial fibrillation: Secondary | ICD-10-CM

## 2017-01-04 LAB — POCT INR: INR: 2.7

## 2017-02-01 ENCOUNTER — Ambulatory Visit (INDEPENDENT_AMBULATORY_CARE_PROVIDER_SITE_OTHER): Payer: Medicare PPO | Admitting: Pharmacist

## 2017-02-01 DIAGNOSIS — I482 Chronic atrial fibrillation: Secondary | ICD-10-CM

## 2017-02-01 DIAGNOSIS — Z5181 Encounter for therapeutic drug level monitoring: Secondary | ICD-10-CM | POA: Diagnosis not present

## 2017-02-01 DIAGNOSIS — I4821 Permanent atrial fibrillation: Secondary | ICD-10-CM

## 2017-02-01 LAB — POCT INR: INR: 2.1

## 2017-02-04 ENCOUNTER — Other Ambulatory Visit: Payer: Self-pay | Admitting: Family Medicine

## 2017-02-04 ENCOUNTER — Ambulatory Visit
Admission: RE | Admit: 2017-02-04 | Discharge: 2017-02-04 | Disposition: A | Discharge status: Home / Self Care | Payer: Medicare PPO | Source: Ambulatory Visit | Attending: Family Medicine | Admitting: Family Medicine

## 2017-02-04 DIAGNOSIS — M5489 Other dorsalgia: Secondary | ICD-10-CM

## 2017-02-08 ENCOUNTER — Telehealth: Payer: Self-pay | Admitting: *Deleted

## 2017-02-08 NOTE — Telephone Encounter (Signed)
Merlin alert received- ERI 02/05/17 patient alert delivered.  I called to make Tyler Gray aware that his ICD is ERI and that his vibratory alert will time out. No changes to appointments- Dr. Ladona Ridgel will discuss generator replacement at scheduled appt 03/29/17. Tyler Gray understanding.

## 2017-02-14 ENCOUNTER — Telehealth: Payer: Self-pay | Admitting: Cardiology

## 2017-02-14 ENCOUNTER — Ambulatory Visit (INDEPENDENT_AMBULATORY_CARE_PROVIDER_SITE_OTHER): Payer: Medicare PPO | Admitting: *Deleted

## 2017-02-14 DIAGNOSIS — I5022 Chronic systolic (congestive) heart failure: Secondary | ICD-10-CM | POA: Diagnosis not present

## 2017-02-14 NOTE — Telephone Encounter (Signed)
Spoke with pt and reminded pt of remote transmission that is due today. Pt verbalized understanding.   

## 2017-02-15 NOTE — Progress Notes (Signed)
Remote ICD transmission.   

## 2017-02-16 LAB — CUP PACEART REMOTE DEVICE CHECK
Battery Remaining Longevity: 0 mo
Battery Voltage: 2.59 V
Brady Statistic AP VP Percent: 82 %
Brady Statistic AP VS Percent: 1 %
Brady Statistic AS VP Percent: 11 %
HighPow Impedance: 47 Ohm
Implantable Lead Implant Date: 20110211
Implantable Lead Location: 753858
Implantable Lead Location: 753859
Implantable Lead Location: 753860
Implantable Lead Model: 4196
Implantable Lead Model: 7121
Lead Channel Impedance Value: 540 Ohm
Lead Channel Impedance Value: 640 Ohm
Lead Channel Pacing Threshold Amplitude: 0.5 V
Lead Channel Pacing Threshold Amplitude: 0.875 V
Lead Channel Pacing Threshold Amplitude: 1 V
Lead Channel Sensing Intrinsic Amplitude: 12 mV
Lead Channel Setting Pacing Amplitude: 1.875
Lead Channel Setting Pacing Pulse Width: 0.5 ms
Lead Channel Setting Pacing Pulse Width: 0.8 ms
Lead Channel Setting Sensing Sensitivity: 0.5 mV
MDC IDC LEAD IMPLANT DT: 20110211
MDC IDC LEAD IMPLANT DT: 20110211
MDC IDC MSMT LEADCHNL LV PACING THRESHOLD PULSEWIDTH: 0.8 ms
MDC IDC MSMT LEADCHNL RA IMPEDANCE VALUE: 340 Ohm
MDC IDC MSMT LEADCHNL RA PACING THRESHOLD PULSEWIDTH: 1 ms
MDC IDC MSMT LEADCHNL RA SENSING INTR AMPL: 2.1 mV
MDC IDC MSMT LEADCHNL RV PACING THRESHOLD PULSEWIDTH: 0.5 ms
MDC IDC PG IMPLANT DT: 20110211
MDC IDC PG SERIAL: 607910
MDC IDC SESS DTM: 20180604183526
MDC IDC SET LEADCHNL LV PACING AMPLITUDE: 2 V
MDC IDC SET LEADCHNL RV PACING AMPLITUDE: 2 V
MDC IDC STAT BRADY AS VS PERCENT: 1 %
MDC IDC STAT BRADY RA PERCENT PACED: 80 %

## 2017-02-22 ENCOUNTER — Encounter: Payer: Self-pay | Admitting: Cardiology

## 2017-03-29 ENCOUNTER — Encounter: Payer: Self-pay | Admitting: Interventional Cardiology

## 2017-03-29 ENCOUNTER — Encounter: Payer: Self-pay | Admitting: Internal Medicine

## 2017-03-29 ENCOUNTER — Encounter (INDEPENDENT_AMBULATORY_CARE_PROVIDER_SITE_OTHER): Payer: Self-pay

## 2017-03-29 ENCOUNTER — Ambulatory Visit (INDEPENDENT_AMBULATORY_CARE_PROVIDER_SITE_OTHER): Payer: Medicare PPO | Admitting: Internal Medicine

## 2017-03-29 ENCOUNTER — Ambulatory Visit (INDEPENDENT_AMBULATORY_CARE_PROVIDER_SITE_OTHER): Payer: Medicare PPO | Admitting: Interventional Cardiology

## 2017-03-29 VITALS — BP 100/66 | HR 60 | Ht 69.0 in | Wt 164.8 lb

## 2017-03-29 DIAGNOSIS — I5022 Chronic systolic (congestive) heart failure: Secondary | ICD-10-CM

## 2017-03-29 DIAGNOSIS — I480 Paroxysmal atrial fibrillation: Secondary | ICD-10-CM | POA: Diagnosis not present

## 2017-03-29 DIAGNOSIS — Z95810 Presence of automatic (implantable) cardiac defibrillator: Secondary | ICD-10-CM

## 2017-03-29 DIAGNOSIS — Z5181 Encounter for therapeutic drug level monitoring: Secondary | ICD-10-CM

## 2017-03-29 DIAGNOSIS — Z79899 Other long term (current) drug therapy: Secondary | ICD-10-CM | POA: Diagnosis not present

## 2017-03-29 DIAGNOSIS — N184 Chronic kidney disease, stage 4 (severe): Secondary | ICD-10-CM | POA: Diagnosis not present

## 2017-03-29 LAB — CUP PACEART INCLINIC DEVICE CHECK
Battery Remaining Longevity: 0 mo
Brady Statistic RA Percent Paced: 81 %
Brady Statistic RV Percent Paced: 93 %
HighPow Impedance: 47.3472
Implantable Lead Implant Date: 20110211
Implantable Lead Implant Date: 20110211
Implantable Lead Location: 753858
Implantable Lead Location: 753859
Implantable Lead Location: 753860
Implantable Lead Model: 7121
Lead Channel Impedance Value: 337.5 Ohm
Lead Channel Impedance Value: 612.5 Ohm
Lead Channel Impedance Value: 650 Ohm
Lead Channel Pacing Threshold Amplitude: 0.5 V
Lead Channel Pacing Threshold Amplitude: 0.5 V
Lead Channel Pacing Threshold Amplitude: 0.75 V
Lead Channel Pacing Threshold Amplitude: 1 V
Lead Channel Pacing Threshold Pulse Width: 0.5 ms
Lead Channel Pacing Threshold Pulse Width: 0.8 ms
Lead Channel Pacing Threshold Pulse Width: 1 ms
Lead Channel Setting Pacing Amplitude: 2 V
Lead Channel Setting Pacing Amplitude: 2 V
Lead Channel Setting Sensing Sensitivity: 0.5 mV
MDC IDC LEAD IMPLANT DT: 20110211
MDC IDC MSMT LEADCHNL LV PACING THRESHOLD AMPLITUDE: 1 V
MDC IDC MSMT LEADCHNL LV PACING THRESHOLD PULSEWIDTH: 0.8 ms
MDC IDC MSMT LEADCHNL RA PACING THRESHOLD AMPLITUDE: 0.75 V
MDC IDC MSMT LEADCHNL RA PACING THRESHOLD PULSEWIDTH: 1 ms
MDC IDC MSMT LEADCHNL RA SENSING INTR AMPL: 1.4 mV
MDC IDC MSMT LEADCHNL RV PACING THRESHOLD PULSEWIDTH: 0.5 ms
MDC IDC MSMT LEADCHNL RV SENSING INTR AMPL: 12 mV
MDC IDC PG IMPLANT DT: 20110211
MDC IDC PG SERIAL: 607910
MDC IDC SESS DTM: 20180717150957
MDC IDC SET LEADCHNL LV PACING PULSEWIDTH: 0.8 ms
MDC IDC SET LEADCHNL RA PACING AMPLITUDE: 1.875
MDC IDC SET LEADCHNL RV PACING PULSEWIDTH: 0.5 ms

## 2017-03-29 NOTE — Patient Instructions (Addendum)
Medication Instructions:  Your physician recommends that you continue on your current medications as directed. Please refer to the Current Medication list given to you today.   Labwork: Your physician recommends that you return for lab work today: BMP/CBC/INR    Testing/Procedures: Your physician has requested that you have an echocardiogram. Echocardiography is a painless test that uses sound waves to create images of your heart. It provides your doctor with information about the size and shape of your heart and how well your heart's chambers and valves are working. This procedure takes approximately one hour. There are no restrictions for this procedure.  Your physician has recommended that you have your Bi V ICD changed out to a Bi V pacemaker. A pacemaker is a small device that is placed under the skin of your chest or abdomen to help control abnormal heart rhythms. This device uses electrical pulses to prompt the heart to beat at a normal rate. Pacemakers are used to treat heart rhythms that are too slow. Wire (leads) are attached to the pacemaker that goes into the chambers of you heart. This is done in the hospital and usually requires and overnight stay. Please see the instruction sheet given to you today for more information.----04/13/17   Please arrive at the [facility] main entrance of [facility] hospital at: 10:30am Do not eat or drink after midnight prior to procedure Use scrub as directed Do not take any medications the morning of the procedure Take last dose of Coumadin on 04/10/17 Plan for one night stay--may go home same day You will need someone to drive you home at discharge          Follow-Up:  Your physician recommends that you schedule a follow-up appointment in: 10-14 days from 04/13/17 in device clinic for wound check and 3 months with Dr Ladona Ridgel

## 2017-03-29 NOTE — Progress Notes (Signed)
Cardiology Office Note    Date:  03/29/2017   ID:  Tyler Gray, DOB [birth year], MRN [id number]  PCP:  Tyler Rakers, MD  Cardiologist: Tyler Noe, MD   Chief Complaint  Patient presents with  . Congestive Heart Failure    History of Present Illness:  Tyler Gray is a 83 y.o. male follow-up of nonischemic cardiomyopathy, paroxysmal atrial fibrillation on amiodarone for rhythm control, chronic systolic heart failure, AICD, hyperlipidemia, and chronic kidney disease stage III  He is an end-of-life on his current AICD. I spoke with Dr. Ladona Gray who will convert him to a dual-chamber CRT pacemaker rather than defibrillator.  The patient denies orthopnea, PND, syncope, and other complaints. There is no peripheral edema.  Past Medical History:  Diagnosis Date  . Atrial fibrillation (HCC)   . Chronic systolic heart failure (HCC)   . Hyperlipidemia   . ICD-St.Jude   . Renal failure    acute    Past Surgical History:  Procedure Laterality Date  . CARDIAC DEFIBRILLATOR PLACEMENT    . POLYPECTOMY      Current Medications: Outpatient Medications Prior to Visit  Medication Sig Dispense Refill  . amiodarone (PACERONE) 200 MG tablet Take 200 mg by mouth daily.    [name] atorvastatin (LIPITOR) 40 MG tablet Take 1 tablet (40 mg total) by mouth daily at 6 PM. 90 tablet 3  . carvedilol (COREG) 6.25 MG tablet Take 0.5 tablets (3.125 mg total) by mouth 2 (two) times daily with a meal.    . furosemide (LASIX) 40 MG tablet Take 40 mg by mouth daily.    . Potassium Chloride ER 20 MEQ TBCR Take 20 mEq by mouth daily.    . simvastatin (ZOCOR) 20 MG tablet Take 20 mg by mouth daily.    [name] spironolactone (ALDACTONE) 25 MG tablet Take 1/2 tablet by mouth daily    . triamcinolone cream (KENALOG) 0.1 % Apply 1 application to affected area twice a day  0  . warfarin (COUMADIN) 3 MG tablet TAKE AS DIRECTED BY COUMADIN CLINIC. 100 tablet 1  . ALLOPURINOL PO Take 1 tablet by mouth daily. Pt  unsure of strength    . potassium chloride SA (K-DUR,KLOR-CON) 20 MEQ tablet TAKE 1 TABLET EVERY DAY (Patient not taking: Reported on 03/29/2017) 90 tablet 3  . spironolactone (ALDACTONE) 25 MG tablet TAKE 1/2 TABLET EVERY DAY (Patient not taking: Reported on 03/29/2017) 45 tablet 3   No facility-administered medications prior to visit.      Allergies:   Patient has no known allergies.   Social History   Social History  . Marital status: Married    Spouse name: N/A  . Number of children: 2  . Years of education: N/A   Occupational History  . retired Environmental manager    Social History Main Topics  . Smoking status: Never Smoker  . Smokeless tobacco: Never Used  . Alcohol use No  . Drug use: No  . Sexual activity: Not Asked   Other Topics Concern  . None   Social History Narrative  . None     Family History:  The patient's family history includes Heart failure in his father and mother; Hypertension in his father and mother; Other in his unknown relative.   ROS:   Please see the history of present illness.    Appetite is stable. Slow weight loss. Gallop on occasion. Primary care physicians changed to Dr. Meredith Gray.  All other systems reviewed and are negative.  PHYSICAL EXAM:   VS:  BP 100/66   Pulse 60   Ht 5\' 9"  (1.753 m)   Wt 164 lb 12.8 oz (74.8 kg)   BMI 24.34 kg/m    GEN: Well nourished, well developed, in no acute distress  HEENT: normal  Neck: no JVD, carotid bruits, or masses Cardiac: RRR; no murmurs, rubs, or gallops,no edema  Respiratory:  clear to auscultation bilaterally, normal work of breathing GI: soft, nontender, nondistended, + BS MS: no deformity or atrophy  Skin: warm and dry, no rash Neuro:  Alert and Oriented x 3, Strength and sensation are intact Psych: euthymic mood, full affect  Wt Readings from Last 3 Encounters:  03/29/17 164 lb 12.8 oz (74.8 kg)  03/29/17 164 lb 12.8 oz (74.8 kg)  04/27/16 169 lb (76.7 kg)      Studies/Labs  Reviewed:   EKG:  EKG  AV sequential pacing and atrial pacing. No change when compared to prior.  Recent Labs: 04/27/2016: ALT 12; TSH 2.71   Lipid Panel No results found for: CHOL, TRIG, HDL, CHOLHDL, VLDL, LDLCALC, LDLDIRECT  Additional studies/ records that were reviewed today include:  .Micael Hampshire will be ordering a 2-D Doppler echocardiogram to reassess LV function. This will be done prior to the planned power source change.   Last echocardiogram performed in February 2011: Study Conclusions  - Left ventricle: The cavity size was moderately dilated. Systolic  function was severely reduced. The estimated ejection fraction was  in the range of 15% to 20%. - Mitral valve: Moderate regurgitation. - Left atrium: The atrium was mildly to moderately dilated. - Right ventricle: The cavity size was moderately dilated. Systolic  function was moderately reduced. - Right atrium: The atrium was severely dilated. - Pulmonary arteries: Systolic pressure was mildly increased. PA  peak pressure: 41mm Hg (S).  ASSESSMENT:    1. CHRONIC SYSTOLIC HEART FAILURE   2. Paroxysmal atrial fibrillation (HCC)   3. Chronic kidney disease, stage IV (severe) (HCC)   4. On amiodarone therapy   5. Automatic implantable cardioverter-defibrillator in situ   6. Encounter for therapeutic drug monitoring      PLAN:  In order of problems listed above:  1. The patient has no evidence of volume overload on exam. A 2-D Doppler echocardiogram is being ordered by.Micael Hampshire prior to device power source change out. 2. Patient is maintaining normal sinus rhythm on amiodarone. Atrial pacing noted on today's EKG. 3. A comprehensive metabolic panel is obtained today 4. A TSH and liver panel will be done to monitor family of toxicity. 5. As noted above, the patient will have an echocardiogram followed by power source change out and likely downgrade from AICD to resynchronization pacemaker. 6. On Coumadin  therapy with no bleeding complications noted.    Medication Adjustments/Labs and Tests Ordered: Current medicines are reviewed at length with the patient today.  Concerns regarding medicines are outlined above.  Medication changes, Labs and Tests ordered today are listed in the Patient Instructions below. Patient Instructions  Medication Instructions:  None  Labwork: TSH and CMET today  Testing/Procedures: None  Follow-Up: Your physician wants you to follow-up in: 6 months with Dr.Adryanna Friedt.  You will receive a reminder letter in the mail two months in advance. If you don't receive a letter, please call our office to schedule the follow-up appointment.   Any Other Special Instructions Will Be Listed Below (If Applicable).     If you need a refill on your cardiac medications before your next appointment,  please call your pharmacy.      Signed, Tyler Noe, MD  03/29/2017 3:52 PM    [facility] [address] [place], [place], [place]  [address] Phone: [phone]; Fax: [phone]

## 2017-03-29 NOTE — Patient Instructions (Signed)
Medication Instructions:  None  Labwork: TSH and CMET today  Testing/Procedures: None  Follow-Up: Your physician wants you to follow-up in: 6 months with Dr. Smith.  You will receive a reminder letter in the mail two months in advance. If you don't receive a letter, please call our office to schedule the follow-up appointment.   Any Other Special Instructions Will Be Listed Below (If Applicable).     If you need a refill on your cardiac medications before your next appointment, please call your pharmacy.   

## 2017-03-30 LAB — COMPREHENSIVE METABOLIC PANEL
A/G RATIO: 1.1 — AB (ref 1.2–2.2)
ALT: 13 IU/L (ref 0–44)
AST: 20 IU/L (ref 0–40)
Albumin: 3.7 g/dL (ref 3.5–4.7)
Alkaline Phosphatase: 26 IU/L — ABNORMAL LOW (ref 39–117)
BILIRUBIN TOTAL: 1.5 mg/dL — AB (ref 0.0–1.2)
BUN/Creatinine Ratio: 15 (ref 10–24)
BUN: 34 mg/dL — ABNORMAL HIGH (ref 8–27)
CALCIUM: 8.7 mg/dL (ref 8.6–10.2)
CHLORIDE: 108 mmol/L — AB (ref 96–106)
CO2: 19 mmol/L — ABNORMAL LOW (ref 20–29)
Creatinine, Ser: 2.34 mg/dL — ABNORMAL HIGH (ref 0.76–1.27)
GFR, EST AFRICAN AMERICAN: 29 mL/min/{1.73_m2} — AB (ref >59)
GFR, EST NON AFRICAN AMERICAN: 25 mL/min/{1.73_m2} — AB (ref >59)
GLOBULIN, TOTAL: 3.4 g/dL (ref 1.5–4.5)
Glucose: 90 mg/dL (ref 65–99)
POTASSIUM: 4.8 mmol/L (ref 3.5–5.2)
SODIUM: 143 mmol/L (ref 134–144)
TOTAL PROTEIN: 7.1 g/dL (ref 6.0–8.5)

## 2017-03-30 LAB — CBC WITH DIFFERENTIAL/PLATELET
Basophils Absolute: 0 10*3/uL (ref 0.0–0.2)
Basos: 1 %
EOS (ABSOLUTE): 0.5 10*3/uL — AB (ref 0.0–0.4)
Eos: 11 %
Hematocrit: 32.9 % — ABNORMAL LOW (ref 37.5–51.0)
Hemoglobin: 10.5 g/dL — ABNORMAL LOW (ref 13.0–17.7)
IMMATURE GRANULOCYTES: 0 %
Immature Grans (Abs): 0 10*3/uL (ref 0.0–0.1)
LYMPHS ABS: 1.4 10*3/uL (ref 0.7–3.1)
Lymphs: 31 %
MCH: 31.6 pg (ref 26.6–33.0)
MCHC: 31.9 g/dL (ref 31.5–35.7)
MCV: 99 fL — ABNORMAL HIGH (ref 79–97)
Monocytes Absolute: 0.5 10*3/uL (ref 0.1–0.9)
Monocytes: 11 %
NEUTROS PCT: 46 %
Neutrophils Absolute: 2.1 10*3/uL (ref 1.4–7.0)
PLATELETS: 104 10*3/uL — AB (ref 150–379)
RBC: 3.32 x10E6/uL — AB (ref 4.14–5.80)
RDW: 12.6 % (ref 12.3–15.4)
WBC: 4.4 10*3/uL (ref 3.4–10.8)

## 2017-03-30 LAB — TSH: TSH: 2.53 u[IU]/mL (ref 0.450–4.500)

## 2017-03-30 LAB — PROTIME-INR
INR: 2.4 — ABNORMAL HIGH (ref 0.8–1.2)
PROTHROMBIN TIME: 23.6 s — AB (ref 9.1–12.0)

## 2017-03-30 NOTE — Progress Notes (Signed)
HPI [name] returns today for followup. He is a very pleasant 83 year old man with a nonischemic cardiomyopathy, chronic systolic heart failure, left bundle branch block, status post biventricular ICD implantation, and atrial fibrillation. He has done well in the interim. He denies chest pain, shortness of breath, syncope, or any ICD shock. No peripheral edema. No ICD shock. He does have a little dizziness. No other complaints.   No Known Allergies   Current Outpatient Prescriptions  Medication Sig Dispense Refill  . amiodarone (PACERONE) 200 MG tablet Take 200 mg by mouth daily.    [name] atorvastatin (LIPITOR) 40 MG tablet Take 1 tablet (40 mg total) by mouth daily at 6 PM. 90 tablet 3  . carvedilol (COREG) 6.25 MG tablet Take 0.5 tablets (3.125 mg total) by mouth 2 (two) times daily with a meal.    . furosemide (LASIX) 40 MG tablet Take 40 mg by mouth daily.    . Potassium Chloride ER 20 MEQ TBCR Take 20 mEq by mouth daily.    . simvastatin (ZOCOR) 20 MG tablet Take 20 mg by mouth daily.    [name] spironolactone (ALDACTONE) 25 MG tablet Take 1/2 tablet by mouth daily    . triamcinolone cream (KENALOG) 0.1 % Apply 1 application to affected area twice a day  0  . warfarin (COUMADIN) 3 MG tablet TAKE AS DIRECTED BY COUMADIN CLINIC. 100 tablet 1   No current facility-administered medications for this visit.      Past Medical History:  Diagnosis Date  . Atrial fibrillation (HCC)   . Chronic systolic heart failure (HCC)   . Hyperlipidemia   . ICD-St.Jude   . Renal failure    acute    ROS:   All systems reviewed and negative except as noted in the HPI.   Past Surgical History:  Procedure Laterality Date  . CARDIAC DEFIBRILLATOR PLACEMENT    . POLYPECTOMY       Family History  Problem Relation Age of Onset  . Heart failure Mother   . Hypertension Mother   . Heart failure Father   . Hypertension Father   . Other Unknown        notable for CHF     Social History   Social  History  . Marital status: Married    Spouse name: N/A  . Number of children: 2  . Years of education: N/A   Occupational History  . retired Environmental manager    Social History Main Topics  . Smoking status: Never Smoker  . Smokeless tobacco: Never Used  . Alcohol use No  . Drug use: No  . Sexual activity: Not on file   Other Topics Concern  . Not on file   Social History Narrative  . No narrative on file     BP 100/66   Pulse 60   Ht 5\' 9"  (1.753 m)   Wt 164 lb 12.8 oz (74.8 kg)   SpO2 98%   BMI 24.34 kg/m   Physical Exam:  Well appearing 83 year old man, NAD HEENT: Unremarkable Neck:  7 cm JVD, no thyromegally Back:  No CVA tenderness Lungs:  Clear with no wheezes, rales, or rhonchi. HEART:  Regular rate rhythm, no murmurs, no rubs, no clicks Abd:  soft, positive bowel sounds, no organomegally, no rebound, no guarding Ext:  2 plus pulses, no edema, no cyanosis, no clubbing Skin:  No rashes no nodules Neuro:  CN II through XII intact, motor grossly intact   DEVICE  Normal device function.  See PaceArt for details.   Assess/Plan: 1. Chronic systolic heart failure - his blood pressure is low. He will reduce his dose of coreg. 2. ICD - his BiV ICD is working normally. He has reached ERI. I will ask him to repeat his echo which was last done 7 years ago. I would anticipate placing a BiV PPM instead of a BiV ICD 3. Atrial fib - he appears to be maintaining NSR over 95% of the time. We will follow.  Leonia Reeves.D.

## 2017-04-04 ENCOUNTER — Telehealth: Payer: Self-pay

## 2017-04-04 ENCOUNTER — Other Ambulatory Visit: Payer: Self-pay | Admitting: Internal Medicine

## 2017-04-04 NOTE — Telephone Encounter (Signed)
Patient called.  Reviewed Pt lab results with Pt.  Advised Pt of Dr. Lubertha Basque note.  Reminded Pt that his surgery is 04/13/2017 @ 1030.  Remind Pt to take last dose of warfarin 04/10/2017.

## 2017-04-06 ENCOUNTER — Other Ambulatory Visit (HOSPITAL_COMMUNITY): Payer: Medicare PPO

## 2017-04-12 ENCOUNTER — Telehealth: Payer: Self-pay | Admitting: Interventional Cardiology

## 2017-04-12 ENCOUNTER — Telehealth (HOSPITAL_COMMUNITY): Payer: Self-pay | Admitting: Internal Medicine

## 2017-04-12 NOTE — Telephone Encounter (Signed)
Attempted to call daughter back to let her know that there is not a DPR on file to speak to her. There was no answer.

## 2017-04-12 NOTE — Telephone Encounter (Signed)
I called [name] to inform him that Dr. Ladona Ridgel did not need him to have an Echocardiogram at this time. Patient voiced understanding.

## 2017-04-12 NOTE — Telephone Encounter (Signed)
Patient's daughter calling back. I made her aware that there is not a DPR on file to speak to her. The patient got on the phone and gave permission to speak to his daughter Patsy Lager. I advised them to fill out the DPR form when they return to the office.  Patsy Lager was calling to make sure that her father had all of his labs drawn prior to his gen change tomorrow. I let her know that he has had all of his labs drawn and that he was suppose to stop taking his coumadin on 7/29. She states that he did. Daughter denied having any additional questions and thanked me for the call.

## 2017-04-12 NOTE — Telephone Encounter (Signed)
New message      Pt is having a procedure tomorrow.  Daughter is calling to see if pt has had all labs and if he needs to have anything done today to prepare for the cath.  Pt is a little confused and cannot tell daughter anything

## 2017-04-12 NOTE — Telephone Encounter (Signed)
New Message ° ° pt verbalized that she is returning call for rn °

## 2017-04-13 ENCOUNTER — Ambulatory Visit (HOSPITAL_COMMUNITY)
Admission: RE | Admit: 2017-04-13 | Discharge: 2017-04-13 | Disposition: A | Discharge status: Home / Self Care | Payer: Medicare PPO | Source: Ambulatory Visit | Attending: Internal Medicine | Admitting: Internal Medicine

## 2017-04-13 ENCOUNTER — Encounter (HOSPITAL_COMMUNITY)
Admission: RE | Disposition: A | Discharge status: Home / Self Care | Payer: Self-pay | Source: Ambulatory Visit | Attending: Internal Medicine

## 2017-04-13 DIAGNOSIS — E785 Hyperlipidemia, unspecified: Secondary | ICD-10-CM | POA: Insufficient documentation

## 2017-04-13 DIAGNOSIS — I447 Left bundle-branch block, unspecified: Secondary | ICD-10-CM | POA: Insufficient documentation

## 2017-04-13 DIAGNOSIS — I4891 Unspecified atrial fibrillation: Secondary | ICD-10-CM | POA: Insufficient documentation

## 2017-04-13 DIAGNOSIS — Z7901 Long term (current) use of anticoagulants: Secondary | ICD-10-CM | POA: Diagnosis not present

## 2017-04-13 DIAGNOSIS — I442 Atrioventricular block, complete: Secondary | ICD-10-CM | POA: Insufficient documentation

## 2017-04-13 DIAGNOSIS — Z4502 Encounter for adjustment and management of automatic implantable cardiac defibrillator: Secondary | ICD-10-CM | POA: Diagnosis not present

## 2017-04-13 DIAGNOSIS — Z8249 Family history of ischemic heart disease and other diseases of the circulatory system: Secondary | ICD-10-CM | POA: Insufficient documentation

## 2017-04-13 DIAGNOSIS — Z79899 Other long term (current) drug therapy: Secondary | ICD-10-CM | POA: Diagnosis not present

## 2017-04-13 DIAGNOSIS — I5022 Chronic systolic (congestive) heart failure: Secondary | ICD-10-CM | POA: Diagnosis not present

## 2017-04-13 DIAGNOSIS — Z95810 Presence of automatic (implantable) cardiac defibrillator: Secondary | ICD-10-CM

## 2017-04-13 HISTORY — DX: Presence of automatic (implantable) cardiac defibrillator: Z95.810

## 2017-04-13 HISTORY — PX: BIV ICD GENERATOR CHANGEOUT: EP1194

## 2017-04-13 LAB — BASIC METABOLIC PANEL
ANION GAP: 8 (ref 5–15)
BUN: 27 mg/dL — ABNORMAL HIGH (ref 6–20)
CALCIUM: 8.9 mg/dL (ref 8.9–10.3)
CO2: 19 mmol/L — AB (ref 22–32)
CREATININE: 2.34 mg/dL — AB (ref 0.61–1.24)
Chloride: 113 mmol/L — ABNORMAL HIGH (ref 101–111)
GFR, EST AFRICAN AMERICAN: 28 mL/min — AB (ref >60)
GFR, EST NON AFRICAN AMERICAN: 24 mL/min — AB (ref >60)
Glucose, Bld: 88 mg/dL (ref 65–99)
Potassium: 4.8 mmol/L (ref 3.5–5.1)
Sodium: 140 mmol/L (ref 135–145)

## 2017-04-13 LAB — PROTIME-INR
INR: 2.26
Prothrombin Time: 25.3 seconds — ABNORMAL HIGH (ref 11.4–15.2)

## 2017-04-13 LAB — SURGICAL PCR SCREEN
MRSA, PCR: NEGATIVE
Staphylococcus aureus: NEGATIVE

## 2017-04-13 SURGERY — BIV ICD GENERATOR CHANGEOUT

## 2017-04-13 MED ORDER — FENTANYL CITRATE (PF) 100 MCG/2ML IJ SOLN
INTRAMUSCULAR | Status: AC
  Filled 2017-04-13: qty 2

## 2017-04-13 MED ORDER — CHLORHEXIDINE GLUCONATE 4 % EX LIQD: 60.0000 mL | Freq: Once | CUTANEOUS | Status: DC

## 2017-04-13 MED ORDER — ONDANSETRON HCL 4 MG/2ML IJ SOLN
4.0000 mg | Freq: Four times a day (QID) | INTRAMUSCULAR | Status: DC | PRN
Start: 2017-04-13 — End: 2017-04-13

## 2017-04-13 MED ORDER — MUPIROCIN 2 % EX OINT
1.0000 | TOPICAL_OINTMENT | Freq: Once | CUTANEOUS | Status: DC
Start: 2017-04-13 — End: 2017-04-13

## 2017-04-13 MED ORDER — FENTANYL CITRATE (PF) 100 MCG/2ML IJ SOLN
INTRAMUSCULAR | Status: DC | PRN
  Administered 2017-04-13 (×2): 12.5 ug via INTRAVENOUS

## 2017-04-13 MED ORDER — MIDAZOLAM HCL 5 MG/5ML IJ SOLN
INTRAMUSCULAR | Status: AC
  Filled 2017-04-13: qty 5

## 2017-04-13 MED ORDER — SODIUM CHLORIDE 0.9 % IR SOLN
Status: AC
  Filled 2017-04-13: qty 2

## 2017-04-13 MED ORDER — MUPIROCIN 2 % EX OINT
TOPICAL_OINTMENT | CUTANEOUS | Status: AC
  Filled 2017-04-13: qty 22

## 2017-04-13 MED ORDER — ACETAMINOPHEN 325 MG PO TABS: 325.0000 mg | ORAL_TABLET | ORAL | Status: DC | PRN

## 2017-04-13 MED ORDER — LIDOCAINE HCL (PF) 1 % IJ SOLN
INTRAMUSCULAR | Status: AC
  Filled 2017-04-13: qty 30

## 2017-04-13 MED ORDER — CEFAZOLIN SODIUM-DEXTROSE 2-4 GM/100ML-% IV SOLN
2.0000 g | INTRAVENOUS | Status: AC
  Administered 2017-04-13: 2 g via INTRAVENOUS

## 2017-04-13 MED ORDER — SODIUM CHLORIDE 0.9 % IR SOLN
80.0000 mg | Status: AC
  Administered 2017-04-13: 80 mg

## 2017-04-13 MED ORDER — CEFAZOLIN SODIUM-DEXTROSE 2-4 GM/100ML-% IV SOLN
INTRAVENOUS | Status: AC
  Filled 2017-04-13: qty 100

## 2017-04-13 MED ORDER — SODIUM CHLORIDE 0.9 % IV SOLN
INTRAVENOUS | Status: DC
  Administered 2017-04-13: 11:00:00 via INTRAVENOUS

## 2017-04-13 MED ORDER — MIDAZOLAM HCL 5 MG/5ML IJ SOLN
INTRAMUSCULAR | Status: DC | PRN
  Administered 2017-04-13 (×3): 1 mg via INTRAVENOUS

## 2017-04-13 MED ORDER — LIDOCAINE HCL (PF) 1 % IJ SOLN
INTRAMUSCULAR | Status: DC | PRN
  Administered 2017-04-13: 40 mL

## 2017-04-13 MED ORDER — KETOROLAC TROMETHAMINE 30 MG/ML IJ SOLN
30.0000 mg | Freq: Once | INTRAMUSCULAR | Status: AC
  Administered 2017-04-13: 30 mg via INTRAVENOUS
  Filled 2017-04-13: qty 1

## 2017-04-13 SURGICAL SUPPLY — 4 items
CABLE SURGICAL S-101-97-12 (CABLE) ×2 IMPLANT
ICD UNIFY ASUR CRT CD3357-40Q (ICD Generator) ×2 IMPLANT
PAD DEFIB LIFELINK (PAD) ×2 IMPLANT
TRAY PACEMAKER INSERTION (PACKS) ×2 IMPLANT

## 2017-04-13 NOTE — Progress Notes (Signed)
Pt c/o left anterior chest pain at incision site. Site is not swollen nor bleeding at this time. Ice pack was placed to chest. Dr Ladona Ridgel was paged x 2 and renee PA for EP was paged for pain med. No return call.

## 2017-04-13 NOTE — Discharge Instructions (Signed)

## 2017-04-13 NOTE — H&P (View-Only) (Signed)
HPI Tyler Gray returns today for followup. He is a very pleasant 83 year old man with a nonischemic cardiomyopathy, chronic systolic heart failure, left bundle branch block, status post biventricular ICD implantation, and atrial fibrillation. He has done well in the interim. He denies chest pain, shortness of breath, syncope, or any ICD shock. No peripheral edema. No ICD shock. He does have a little dizziness. No other complaints.   No Known Allergies   Current Outpatient Prescriptions  Medication Sig Dispense Refill  . amiodarone (PACERONE) 200 MG tablet Take 200 mg by mouth daily.    [name] atorvastatin (LIPITOR) 40 MG tablet Take 1 tablet (40 mg total) by mouth daily at 6 PM. 90 tablet 3  . carvedilol (COREG) 6.25 MG tablet Take 0.5 tablets (3.125 mg total) by mouth 2 (two) times daily with a meal.    . furosemide (LASIX) 40 MG tablet Take 40 mg by mouth daily.    . Potassium Chloride ER 20 MEQ TBCR Take 20 mEq by mouth daily.    . simvastatin (ZOCOR) 20 MG tablet Take 20 mg by mouth daily.    [name] spironolactone (ALDACTONE) 25 MG tablet Take 1/2 tablet by mouth daily    . triamcinolone cream (KENALOG) 0.1 % Apply 1 application to affected area twice a day  0  . warfarin (COUMADIN) 3 MG tablet TAKE AS DIRECTED BY COUMADIN CLINIC. 100 tablet 1   No current facility-administered medications for this visit.      Past Medical History:  Diagnosis Date  . Atrial fibrillation (HCC)   . Chronic systolic heart failure (HCC)   . Hyperlipidemia   . ICD-St.Jude   . Renal failure    acute    ROS:   All systems reviewed and negative except as noted in the HPI.   Past Surgical History:  Procedure Laterality Date  . CARDIAC DEFIBRILLATOR PLACEMENT    . POLYPECTOMY       Family History  Problem Relation Age of Onset  . Heart failure Mother   . Hypertension Mother   . Heart failure Father   . Hypertension Father   . Other Unknown        notable for CHF     Social History   Social  History  . Marital status: Married    Spouse name: N/A  . Number of children: 2  . Years of education: N/A   Occupational History  . retired Environmental manager    Social History Main Topics  . Smoking status: Never Smoker  . Smokeless tobacco: Never Used  . Alcohol use No  . Drug use: No  . Sexual activity: Not on file   Other Topics Concern  . Not on file   Social History Narrative  . No narrative on file     BP 100/66   Pulse 60   Ht 5\' 9"  (1.753 m)   Wt 164 lb 12.8 oz (74.8 kg)   SpO2 98%   BMI 24.34 kg/m   Physical Exam:  Well appearing 83 year old man, NAD HEENT: Unremarkable Neck:  7 cm JVD, no thyromegally Back:  No CVA tenderness Lungs:  Clear with no wheezes, rales, or rhonchi. HEART:  Regular rate rhythm, no murmurs, no rubs, no clicks Abd:  soft, positive bowel sounds, no organomegally, no rebound, no guarding Ext:  2 plus pulses, no edema, no cyanosis, no clubbing Skin:  No rashes no nodules Neuro:  CN II through XII intact, motor grossly intact   DEVICE  Normal device function.  See PaceArt for details.   Assess/Plan: 1. Chronic systolic heart failure - his blood pressure is low. He will reduce his dose of coreg. 2. ICD - his BiV ICD is working normally. He has reached ERI. I will ask him to repeat his echo which was last done 7 years ago. I would anticipate placing a BiV PPM instead of a BiV ICD 3. Atrial fib - he appears to be maintaining NSR over 95% of the time. We will follow.  Leonia Reeves.D.

## 2017-04-13 NOTE — Interval H&P Note (Signed)
History and Physical Interval Note: The patient has been found to have a DF4 lead. For this reason downgrade to a BiV PM would require insertion of a new RV rate sense lead. For this reason we will place a new BiV ICD.   04/13/2017 2:08 PM  Tyler Gray  has presented today for surgery, with the diagnosis of heart failure, afib, eri  The various methods of treatment have been discussed with the patient and family. After consideration of risks, benefits and other options for treatment, the patient has consented to  BiV ICD generator change out as a surgical intervention .  The patient's history has been reviewed, patient examined, no change in status, stable for surgery.  I have reviewed the patient's chart and labs.  Questions were answered to the patient's satisfaction.     [name]

## 2017-04-14 ENCOUNTER — Encounter (HOSPITAL_COMMUNITY): Payer: Self-pay | Admitting: Internal Medicine

## 2017-04-15 ENCOUNTER — Encounter (HOSPITAL_COMMUNITY): Payer: Self-pay | Admitting: Cardiology

## 2017-04-27 ENCOUNTER — Ambulatory Visit (INDEPENDENT_AMBULATORY_CARE_PROVIDER_SITE_OTHER): Payer: Medicare PPO | Admitting: *Deleted

## 2017-04-27 ENCOUNTER — Encounter (HOSPITAL_COMMUNITY)
Admission: RE | Disposition: A | Discharge status: Home / Self Care | Payer: Self-pay | Source: Ambulatory Visit | Attending: Internal Medicine

## 2017-04-27 ENCOUNTER — Telehealth: Payer: Self-pay | Admitting: *Deleted

## 2017-04-27 ENCOUNTER — Ambulatory Visit (HOSPITAL_COMMUNITY)
Admission: RE | Admit: 2017-04-27 | Discharge: 2017-04-27 | Disposition: A | Discharge status: Home / Self Care | Payer: Medicare PPO | Source: Ambulatory Visit | Attending: Internal Medicine | Admitting: Internal Medicine

## 2017-04-27 DIAGNOSIS — I5022 Chronic systolic (congestive) heart failure: Secondary | ICD-10-CM

## 2017-04-27 DIAGNOSIS — Z95810 Presence of automatic (implantable) cardiac defibrillator: Secondary | ICD-10-CM

## 2017-04-27 DIAGNOSIS — T82190A Other mechanical complication of cardiac electrode, initial encounter: Secondary | ICD-10-CM | POA: Diagnosis not present

## 2017-04-27 DIAGNOSIS — T82110A Breakdown (mechanical) of cardiac electrode, initial encounter: Secondary | ICD-10-CM | POA: Diagnosis not present

## 2017-04-27 DIAGNOSIS — Y838 Other surgical procedures as the cause of abnormal reaction of the patient, or of later complication, without mention of misadventure at the time of the procedure: Secondary | ICD-10-CM | POA: Diagnosis not present

## 2017-04-27 HISTORY — PX: FLUOROSCOPY GUIDANCE: CATH118240

## 2017-04-27 LAB — CUP PACEART INCLINIC DEVICE CHECK
Brady Statistic RA Percent Paced: 89 %
Brady Statistic RV Percent Paced: 98 %
Date Time Interrogation Session: 20180815143213
HIGH POWER IMPEDANCE MEASURED VALUE: 35.1711
Implantable Lead Implant Date: 20110211
Implantable Lead Location: 753858
Implantable Lead Location: 753859
Implantable Lead Location: 753860
Implantable Lead Model: 7121
Lead Channel Impedance Value: 300 Ohm
Lead Channel Impedance Value: 600 Ohm
Lead Channel Pacing Threshold Amplitude: 0.5 V
Lead Channel Pacing Threshold Amplitude: 1.25 V
Lead Channel Pacing Threshold Pulse Width: 0.4 ms
Lead Channel Pacing Threshold Pulse Width: 0.5 ms
Lead Channel Sensing Intrinsic Amplitude: 12 mV
Lead Channel Setting Pacing Amplitude: 2.5 V
Lead Channel Setting Pacing Amplitude: 2.5 V
Lead Channel Setting Pacing Pulse Width: 0.5 ms
Lead Channel Setting Sensing Sensitivity: 0.5 mV
MDC IDC LEAD IMPLANT DT: 20110211
MDC IDC LEAD IMPLANT DT: 20110211
MDC IDC MSMT BATTERY REMAINING LONGEVITY: 61 mo
MDC IDC MSMT LEADCHNL LV PACING THRESHOLD AMPLITUDE: 1.25 V
MDC IDC MSMT LEADCHNL LV PACING THRESHOLD PULSEWIDTH: 0.4 ms
MDC IDC MSMT LEADCHNL RV IMPEDANCE VALUE: 462.5 Ohm
MDC IDC PG IMPLANT DT: 20180801
MDC IDC SET LEADCHNL LV PACING AMPLITUDE: 2.25 V
MDC IDC SET LEADCHNL LV PACING PULSEWIDTH: 0.4 ms
Pulse Gen Serial Number: 7377399

## 2017-04-27 SURGERY — FLUOROSCOPY GUIDANCE
Anesthesia: LOCAL

## 2017-04-27 NOTE — Telephone Encounter (Signed)
Spoke with patient's wife at his request to advise of Dr. Odessa Fleming recommendations to have a fluoroscopy study done today.  Explained reasoning behind order.  Patient's wife is appreciative of explanation and denies additional questions or concerns at this time.

## 2017-04-27 NOTE — Progress Notes (Signed)
Pt returned from EP lab after chest xr.  Taking liquids without diff.  Denies pain.  Instructed to call Dr. Graciela Husbands with any problems/questions.

## 2017-04-27 NOTE — Patient Instructions (Signed)
Please go to [facility] (main entrance off of [facility]) for a fluoroscopy of your device leads.  You will be seen in Short Stay for this test.  Call the Device Clinic at [phone] if you have any questions or concerns.

## 2017-04-27 NOTE — Discharge Instructions (Signed)
, °

## 2017-04-27 NOTE — Progress Notes (Signed)
Wound check appointment. Steri-strips removed. Wound without redness, some edema present but soft to palpation. Incision edges approximated and well healed with exception of stitch removed from medial incision. Antibiotic ointment and bandaid applied. SK to check site while patient at [facility] for fluoroscopy of leads.  RV and LV thresholds, sensing, and impedances consistent with implant measurements. No P-waves sensed by device, but visible on EKG. No RA capture noted. Discussed with SK, plan for fluoroscopy of leads later today. RA sensitivity increased to 0.32mV, AutoSense turned off. RV output fixed at 2.5V @ 0.50ms. Histogram distribution appropriate for patient and level of activity. No mode switches or ventricular arrhythmias noted. Patient educated about wound care, arm mobility, lifting restrictions, shock plan, and Merlin monitor. Plan for possible RA lead revision pending results of fluoroscopy.

## 2017-04-28 ENCOUNTER — Encounter (HOSPITAL_COMMUNITY): Payer: Self-pay | Admitting: Internal Medicine

## 2017-04-28 ENCOUNTER — Telehealth: Payer: Self-pay | Admitting: *Deleted

## 2017-04-28 DIAGNOSIS — T82110A Breakdown (mechanical) of cardiac electrode, initial encounter: Secondary | ICD-10-CM

## 2017-04-28 NOTE — H&P (Signed)
Seen for floursoccoply  Noted to have pocket hematoma

## 2017-04-28 NOTE — Telephone Encounter (Signed)
Spoke with patient to follow-up from fluoroscopy and wound check appointment on 8/15.  Per Dr. Graciela Husbands, pressure dressing was placed on 8/15 after the fluoroscopy.  Patient reports dressing is still in place.  Dr. Graciela Husbands recommended continuing to HOLD warfarin until hematoma reassessment.  Patient verbalizes understanding of instructions.  He is agreeable to appointment with Dr. Ladona Ridgel on 05/03/17 at 10:15am at the [facility] office.  Patient is appreciative of call and denies additional questions or concerns at this time.

## 2017-04-29 ENCOUNTER — Telehealth: Payer: Self-pay

## 2017-04-29 NOTE — Telephone Encounter (Signed)
Call made to Pt.  Requested call back.  Need to schedule Pt for lead revision with Dr. Ladona Ridgel next week.

## 2017-05-02 NOTE — Telephone Encounter (Signed)
Pt with follow up appt with Dr. Ladona Ridgel 05/03/2017.  Will schedule at that time.

## 2017-05-03 ENCOUNTER — Encounter: Payer: Self-pay | Admitting: Internal Medicine

## 2017-05-03 ENCOUNTER — Ambulatory Visit (INDEPENDENT_AMBULATORY_CARE_PROVIDER_SITE_OTHER): Payer: Medicare PPO | Admitting: Internal Medicine

## 2017-05-03 VITALS — BP 102/72 | HR 62 | Ht 69.0 in | Wt 164.0 lb

## 2017-05-03 DIAGNOSIS — I5022 Chronic systolic (congestive) heart failure: Secondary | ICD-10-CM

## 2017-05-03 DIAGNOSIS — T82110A Breakdown (mechanical) of cardiac electrode, initial encounter: Secondary | ICD-10-CM

## 2017-05-03 NOTE — Patient Instructions (Addendum)
Medication Instructions:  Your physician recommends that you continue on your current medications as directed. Please refer to the Current Medication list given to you today. Continue taking your warfarin.  Labwork: None ordered.   Testing/Procedures: None ordered.   Follow-Up: Your physician recommends that you schedule a follow-up appointment in: first week of November.   Remote monitoring is used to monitor your ICD from home. This monitoring reduces the number of office visits required to check your device to one time per year. It allows [place] to keep an eye on the functioning of your device to ensure it is working properly. You are scheduled for a device check from home on 07/26/2017. You may send your transmission at any time that day. If you have a wireless device, the transmission will be sent automatically. After your physician reviews your transmission, you will receive a postcard with your next transmission date.      Any Other Special Instructions Will Be Listed Below (If Applicable).     If you need a refill on your cardiac medications before your next appointment, please call your pharmacy.

## 2017-05-03 NOTE — Progress Notes (Signed)
HPI Tyler Gray returns today for follow-up. He is an 83 year old man who underwent biventricular ICD insertion approximately 8 years ago. He reached elective replacement. He was found to have an atrial lead which had withdrawn prior to his recent ICD generator change out. Initially his atrial lead was working normally. He underwent generator change out with relocation of his device to a subpectoral location, and he developed an ICD pocket hematoma under his pectoralis major. This has finally resolved. He underwent fluoroscopy which demonstrated that his setscrew was in position. There is no obvious change in Tyler location of Tyler pacing and defibrillator leads. Tyler Gray is not particularly symptomatic. He has residual soreness over his implant but this is improving. His heart failure is class II. When his atrial lead was interrogated several weeks ago, it was neither capturing nor sensing. No Known Allergies   Current Outpatient Prescriptions  Medication Sig Dispense Refill  . amiodarone (PACERONE) 200 MG tablet Take 100 mg by mouth daily.     [name] atorvastatin (LIPITOR) 40 MG tablet Take 1 tablet (40 mg total) by mouth daily at 6 PM. 90 tablet 3  . carvedilol (COREG) 6.25 MG tablet Take 0.5 tablets (3.125 mg total) by mouth 2 (two) times daily with a meal.    . furosemide (LASIX) 40 MG tablet Take 20 mg by mouth daily.     [name] ibuprofen (ADVIL,MOTRIN) 200 MG tablet Take 200-400 mg by mouth daily as needed for headache or moderate pain.    [name] Potassium Chloride ER 20 MEQ TBCR Take 20 mEq by mouth daily.    [name] spironolactone (ALDACTONE) 25 MG tablet Take 12.5 mg by mouth daily. Take 1/2 tablet by mouth daily     . triamcinolone cream (KENALOG) 0.1 % Apply 1 application topically daily.   0  . warfarin (COUMADIN) 3 MG tablet TAKE AS DIRECTED BY COUMADIN CLINIC. 100 tablet 1   No current facility-administered medications for this visit.      Past Medical History:  Diagnosis Date  . Atrial  fibrillation (HCC)   . Chronic systolic heart failure (HCC)   . Hyperlipidemia   . ICD-St.Jude   . Renal failure    acute    ROS:   All systems reviewed and negative except as noted in Tyler HPI.   Past Surgical History:  Procedure Laterality Date  . BIV ICD GENERATOR CHANGEOUT N/A 04/13/2017   Procedure: BiV ICD Generator Changeout;  Surgeon: Marinus Maw, MD;  Location: [facility] INVASIVE CV LAB;  Service: Cardiovascular;  Laterality: N/A;  . CARDIAC DEFIBRILLATOR PLACEMENT    . FLUOROSCOPY GUIDANCE N/A 04/27/2017   Procedure: FLUOROSCOPY GUIDANCE;  Surgeon: Duke Salvia, MD;  Location: [facility] INVASIVE CV LAB;  Service: Cardiovascular;  Laterality: N/A;  . POLYPECTOMY       Family History  Problem Relation Age of Onset  . Heart failure Mother   . Hypertension Mother   . Heart failure Father   . Hypertension Father   . Other Unknown        notable for CHF     Social History   Social History  . Marital status: Married    Spouse name: N/A  . Number of children: 2  . Years of education: N/A   Occupational History  . retired Environmental manager    Social History Main Topics  . Smoking status: Never Smoker  . Smokeless tobacco: Never Used  . Alcohol use No  . Drug use: No  .  Sexual activity: Not on file   Other Topics Concern  . Not on file   Social History Narrative  . No narrative on file     BP 102/72   Pulse 62   Ht 5\' 9"  (1.753 m)   Wt 164 lb (74.4 kg)   BMI 24.22 kg/m   Physical Exam:  Chronically ill appearing frail, 83 year old man, NAD HEENT: Unremarkable Neck:  7 cm JVD, no thyromegally Lymphatics:  No adenopathy Back:  No CVA tenderness Lungs:  Clear, with no wheezes, rales, or rhonchi, ICD incision appears to be healing nicely with very minimal residual hematoma HEART:  Regular rate rhythm, no murmurs, no rubs, no clicks Abd:  soft, positive bowel sounds, no organomegally, no rebound, no guarding Ext:  2 plus pulses, no edema, no cyanosis, no  clubbing Skin:  No rashes no nodules Neuro:  CN II through XII intact, motor grossly intact   DEVICE  Device interrogation was not performed today.  Assess/Plan: 1. Atrial lead dysfunction - Tyler Gray's atrial lead appears to be not working correctly. We discussed Tyler treatment options including atrial lead extraction as well as attempted insertion of a new atrial pacing lead. Because of Tyler high risk associated with this procedure, and because Tyler Gray is not particularly symptomatic at this time, I recommended watchful waiting. I'm very much concerned about Tyler development of an infection if we go back in and Tyler short term. If however Tyler Gray develops worsening symptoms, atrial lead revision including possible extraction would be recommended. 2. Chronic systolic heart failure - his symptoms are class II. He has biventricular pacing. 3. ICD pocket hematoma - his pocket hematoma appears to be resolving nicely. He will undergo watchful waiting. He has restarted his anticoagulation.

## 2017-05-18 NOTE — Progress Notes (Signed)
Removed letter.  Not needed at this time.

## 2017-06-02 ENCOUNTER — Other Ambulatory Visit: Payer: Self-pay | Admitting: Interventional Cardiology

## 2017-06-02 ENCOUNTER — Ambulatory Visit (INDEPENDENT_AMBULATORY_CARE_PROVIDER_SITE_OTHER): Payer: Medicare PPO | Admitting: *Deleted

## 2017-06-02 ENCOUNTER — Telehealth: Payer: Self-pay | Admitting: *Deleted

## 2017-06-02 ENCOUNTER — Encounter (INDEPENDENT_AMBULATORY_CARE_PROVIDER_SITE_OTHER): Payer: Self-pay

## 2017-06-02 DIAGNOSIS — Z5181 Encounter for therapeutic drug level monitoring: Secondary | ICD-10-CM | POA: Diagnosis not present

## 2017-06-02 DIAGNOSIS — I482 Chronic atrial fibrillation: Secondary | ICD-10-CM

## 2017-06-02 DIAGNOSIS — I480 Paroxysmal atrial fibrillation: Secondary | ICD-10-CM

## 2017-06-02 DIAGNOSIS — I4821 Permanent atrial fibrillation: Secondary | ICD-10-CM

## 2017-06-02 LAB — POCT INR: INR: 3.8

## 2017-06-02 MED ORDER — WARFARIN SODIUM 3 MG PO TABS: ORAL_TABLET | ORAL | 0 refills | Status: DC

## 2017-06-02 NOTE — Telephone Encounter (Signed)
Pt called wanting refill on his coumadin Pt has not been seen in coumadin clinic since May 22.2018. Pt instructed need to see him to check his INR and appt made for him to be seen today in the coumadin clinic and he states understanding

## 2017-06-14 ENCOUNTER — Ambulatory Visit (INDEPENDENT_AMBULATORY_CARE_PROVIDER_SITE_OTHER): Payer: Medicare PPO | Admitting: *Deleted

## 2017-06-14 DIAGNOSIS — I480 Paroxysmal atrial fibrillation: Secondary | ICD-10-CM | POA: Diagnosis not present

## 2017-06-14 DIAGNOSIS — Z5181 Encounter for therapeutic drug level monitoring: Secondary | ICD-10-CM

## 2017-06-14 DIAGNOSIS — I482 Chronic atrial fibrillation: Secondary | ICD-10-CM | POA: Diagnosis not present

## 2017-06-14 DIAGNOSIS — I4821 Permanent atrial fibrillation: Secondary | ICD-10-CM

## 2017-06-14 LAB — POCT INR: INR: 1.7

## 2017-06-28 ENCOUNTER — Ambulatory Visit (INDEPENDENT_AMBULATORY_CARE_PROVIDER_SITE_OTHER): Payer: Medicare PPO | Admitting: Pharmacist

## 2017-06-28 DIAGNOSIS — Z5181 Encounter for therapeutic drug level monitoring: Secondary | ICD-10-CM | POA: Diagnosis not present

## 2017-06-28 DIAGNOSIS — I480 Paroxysmal atrial fibrillation: Secondary | ICD-10-CM | POA: Diagnosis not present

## 2017-06-28 DIAGNOSIS — I482 Chronic atrial fibrillation: Secondary | ICD-10-CM | POA: Diagnosis not present

## 2017-06-28 DIAGNOSIS — I4821 Permanent atrial fibrillation: Secondary | ICD-10-CM

## 2017-06-28 LAB — POCT INR: INR: 3.5

## 2017-06-29 ENCOUNTER — Encounter: Payer: Self-pay | Admitting: Internal Medicine

## 2017-07-12 ENCOUNTER — Encounter: Payer: Self-pay | Admitting: Internal Medicine

## 2017-07-12 ENCOUNTER — Ambulatory Visit (INDEPENDENT_AMBULATORY_CARE_PROVIDER_SITE_OTHER): Payer: Medicare PPO | Admitting: Internal Medicine

## 2017-07-12 ENCOUNTER — Ambulatory Visit (INDEPENDENT_AMBULATORY_CARE_PROVIDER_SITE_OTHER): Payer: Medicare PPO | Admitting: *Deleted

## 2017-07-12 VITALS — BP 98/66 | HR 60 | Ht 69.0 in | Wt 165.0 lb

## 2017-07-12 DIAGNOSIS — Z5181 Encounter for therapeutic drug level monitoring: Secondary | ICD-10-CM

## 2017-07-12 DIAGNOSIS — Z95810 Presence of automatic (implantable) cardiac defibrillator: Secondary | ICD-10-CM

## 2017-07-12 DIAGNOSIS — I4821 Permanent atrial fibrillation: Secondary | ICD-10-CM

## 2017-07-12 DIAGNOSIS — I5022 Chronic systolic (congestive) heart failure: Secondary | ICD-10-CM

## 2017-07-12 DIAGNOSIS — I482 Chronic atrial fibrillation: Secondary | ICD-10-CM

## 2017-07-12 DIAGNOSIS — R52 Pain, unspecified: Secondary | ICD-10-CM

## 2017-07-12 DIAGNOSIS — I480 Paroxysmal atrial fibrillation: Secondary | ICD-10-CM

## 2017-07-12 LAB — CUP PACEART INCLINIC DEVICE CHECK
Battery Remaining Longevity: 79 mo
Date Time Interrogation Session: 20181030142541
HIGH POWER IMPEDANCE MEASURED VALUE: 34.8966
Implantable Lead Implant Date: 20110211
Implantable Lead Implant Date: 20110211
Implantable Lead Implant Date: 20110211
Implantable Lead Location: 753858
Implantable Lead Model: 4196
Implantable Lead Model: 7121
Lead Channel Pacing Threshold Amplitude: 0.5 V
Lead Channel Pacing Threshold Amplitude: 1 V
Lead Channel Pacing Threshold Pulse Width: 0.4 ms
Lead Channel Pacing Threshold Pulse Width: 0.4 ms
Lead Channel Setting Pacing Amplitude: 2.125
Lead Channel Setting Pacing Amplitude: 2.5 V
Lead Channel Setting Pacing Pulse Width: 0.5 ms
MDC IDC LEAD LOCATION: 753859
MDC IDC LEAD LOCATION: 753860
MDC IDC MSMT LEADCHNL LV IMPEDANCE VALUE: 587.5 Ohm
MDC IDC MSMT LEADCHNL LV PACING THRESHOLD AMPLITUDE: 1 V
MDC IDC MSMT LEADCHNL RA IMPEDANCE VALUE: 287.5 Ohm
MDC IDC MSMT LEADCHNL RA SENSING INTR AMPL: 0.2 mV
MDC IDC MSMT LEADCHNL RV IMPEDANCE VALUE: 462.5 Ohm
MDC IDC MSMT LEADCHNL RV PACING THRESHOLD AMPLITUDE: 0.5 V
MDC IDC MSMT LEADCHNL RV PACING THRESHOLD PULSEWIDTH: 0.5 ms
MDC IDC MSMT LEADCHNL RV PACING THRESHOLD PULSEWIDTH: 0.5 ms
MDC IDC MSMT LEADCHNL RV SENSING INTR AMPL: 12 mV
MDC IDC PG IMPLANT DT: 20180801
MDC IDC PG SERIAL: 7377399
MDC IDC SET LEADCHNL LV PACING PULSEWIDTH: 0.4 ms
MDC IDC SET LEADCHNL RV SENSING SENSITIVITY: 0.5 mV
MDC IDC STAT BRADY RA PERCENT PACED: 93 %
MDC IDC STAT BRADY RV PERCENT PACED: 94 %

## 2017-07-12 LAB — POCT INR: INR: 3

## 2017-07-12 MED ORDER — ACETAMINOPHEN 325 MG PO TABS: 650.0000 mg | ORAL_TABLET | Freq: Four times a day (QID) | ORAL | 0 refills | Status: AC | PRN

## 2017-07-12 NOTE — Patient Instructions (Addendum)
Medication Instructions:  Your physician has recommended you make the following change in your medication:  1.  Stop taking amiodarone 2.  Stop taking ibuprofen 3.  Use Tylenol for pain- Tylenol 325 mg (2 tablets) by mouth every 6 hours as needed for pain. 4.  Avoid salty foods   Labwork: None ordered.  Testing/Procedures: None ordered.  Follow-Up: Your physician wants you to follow-up in: 6 months with Dr. Ladona Ridgel.   You will receive a reminder letter in the mail two months in advance. If you don't receive a letter, please call our office to schedule the follow-up appointment.  Remote monitoring is used to monitor your Pacemaker from home. This monitoring reduces the number of office visits required to check your device to one time per year. It allows [place] to keep an eye on the functioning of your device to ensure it is working properly. You are scheduled for a device check from home on 10/11/2016. You may send your transmission at any time that day. If you have a wireless device, the transmission will be sent automatically. After your physician reviews your transmission, you will receive a postcard with your next transmission date.    Any Other Special Instructions Will Be Listed Below (If Applicable).     If you need a refill on your cardiac medications before your next appointment, please call your pharmacy.

## 2017-07-12 NOTE — Progress Notes (Signed)
HPI Mr. Ellenbecker returns today for followup. He is a pleasant 83 yo man with a h/o chronic systolic heart failure, s/p BiV ICD implant, who was found to have a non-functioning atrial lead for which he was asymptomatic. He has subsequently reverted to atrial fib. He was asymptomatic. No chest pain or sob. Minimal edema. No Known Allergies   Current Outpatient Prescriptions  Medication Sig Dispense Refill  . amiodarone (PACERONE) 200 MG tablet Take 100 mg by mouth daily.     [name] atorvastatin (LIPITOR) 40 MG tablet Take 1 tablet (40 mg total) by mouth daily at 6 PM. 90 tablet 3  . carvedilol (COREG) 6.25 MG tablet Take 0.5 tablets (3.125 mg total) by mouth 2 (two) times daily with a meal.    . furosemide (LASIX) 40 MG tablet Take 20 mg by mouth daily.     [name] ibuprofen (ADVIL,MOTRIN) 200 MG tablet Take 200-400 mg by mouth daily as needed for headache or moderate pain.    [name] Potassium Chloride ER 20 MEQ TBCR Take 20 mEq by mouth daily.    [name] spironolactone (ALDACTONE) 25 MG tablet Take 12.5 mg by mouth daily. Take 1/2 tablet by mouth daily     . triamcinolone cream (KENALOG) 0.1 % Apply 1 application topically daily.   0  . warfarin (COUMADIN) 3 MG tablet TAKE AS DIRECTED BY COUMADIN CLINIC. 100 tablet 0   No current facility-administered medications for this visit.      Past Medical History:  Diagnosis Date  . Atrial fibrillation (HCC)   . Chronic systolic heart failure (HCC)   . Hyperlipidemia   . ICD-St.Jude   . Renal failure    acute    ROS:   All systems reviewed and negative except as noted in the HPI.   Past Surgical History:  Procedure Laterality Date  . BIV ICD GENERATOR CHANGEOUT N/A 04/13/2017   Procedure: BiV ICD Generator Changeout;  Surgeon: Marinus Maw, MD;  Location: [facility] INVASIVE CV LAB;  Service: Cardiovascular;  Laterality: N/A;  . CARDIAC DEFIBRILLATOR PLACEMENT    . FLUOROSCOPY GUIDANCE N/A 04/27/2017   Procedure: FLUOROSCOPY GUIDANCE;  Surgeon: Duke Salvia, MD;  Location: [facility] INVASIVE CV LAB;  Service: Cardiovascular;  Laterality: N/A;  . POLYPECTOMY       Family History  Problem Relation Age of Onset  . Heart failure Mother   . Hypertension Mother   . Heart failure Father   . Hypertension Father   . Other Unknown        notable for CHF     Social History   Social History  . Marital status: Married    Spouse name: N/A  . Number of children: 2  . Years of education: N/A   Occupational History  . retired Environmental manager    Social History Main Topics  . Smoking status: Never Smoker  . Smokeless tobacco: Never Used  . Alcohol use No  . Drug use: No  . Sexual activity: Not on file   Other Topics Concern  . Not on file   Social History Narrative  . No narrative on file     BP 98/66   Pulse 60   Ht 5\' 9"  (1.753 m)   Wt 165 lb (74.8 kg)   BMI 24.37 kg/m   Physical Exam:  Well appearing 83 yo man, NAD HEENT: Unremarkable Neck:  6 cm JVD, no thyromegally Lymphatics:  No adenopathy Back:  No CVA tenderness Lungs:  Clear  with no wheezes HEART:  Regular rate rhythm, no murmurs, no rubs, no clicks Abd:  soft, positive bowel sounds, no organomegally, no rebound, no guarding Ext:  2 plus pulses, no edema, no cyanosis, no clubbing Skin:  No rashes no nodules Neuro:  CN II through XII intact, motor grossly intact  EKG - atrial fib/flutter with ventricular pacing  DEVICE  Normal device function.  See PaceArt for details.   Assess/Plan: 1. Atrial fib - his rate is controlled. He will continue his warfarin. He will stop amiodarone.  2. Chronic systolic heart failure - his symptoms are class 2. I have asked him to stop using NSAIDS and reduce his salt intake. 3. ICD - his atrial lead is non-functional. I have reprogrammed her to VVI mode today.  4. Dyslipidemia - he will continue his lipitor. He will maintain a low fat diet.  Leonia ReevesGregg Demaree Liberto,M.D.

## 2017-07-14 ENCOUNTER — Other Ambulatory Visit: Payer: Self-pay | Admitting: Interventional Cardiology

## 2017-07-26 ENCOUNTER — Ambulatory Visit (INDEPENDENT_AMBULATORY_CARE_PROVIDER_SITE_OTHER): Payer: Medicare PPO | Admitting: *Deleted

## 2017-07-26 ENCOUNTER — Encounter: Payer: Medicare PPO | Admitting: Internal Medicine

## 2017-07-26 DIAGNOSIS — Z5181 Encounter for therapeutic drug level monitoring: Secondary | ICD-10-CM

## 2017-07-26 DIAGNOSIS — I480 Paroxysmal atrial fibrillation: Secondary | ICD-10-CM | POA: Diagnosis not present

## 2017-07-26 DIAGNOSIS — I482 Chronic atrial fibrillation: Secondary | ICD-10-CM | POA: Diagnosis not present

## 2017-07-26 DIAGNOSIS — I4821 Permanent atrial fibrillation: Secondary | ICD-10-CM

## 2017-07-26 LAB — POCT INR: INR: 1.9

## 2017-07-26 NOTE — Patient Instructions (Addendum)
Today take 1 and 1/2 tablets, then continue taking 1 tablet every day except 1 and 1/2 tablets on Mondays and Fridays. Recheck in 2-3 weeks. Call if any new medications or changes # [phone]. Eat 2-3  servings of greens weekly.

## 2017-08-16 ENCOUNTER — Ambulatory Visit (INDEPENDENT_AMBULATORY_CARE_PROVIDER_SITE_OTHER): Payer: Medicare PPO | Admitting: *Deleted

## 2017-08-16 DIAGNOSIS — I480 Paroxysmal atrial fibrillation: Secondary | ICD-10-CM | POA: Diagnosis not present

## 2017-08-16 DIAGNOSIS — I482 Chronic atrial fibrillation: Secondary | ICD-10-CM

## 2017-08-16 DIAGNOSIS — Z5181 Encounter for therapeutic drug level monitoring: Secondary | ICD-10-CM | POA: Diagnosis not present

## 2017-08-16 DIAGNOSIS — I4821 Permanent atrial fibrillation: Secondary | ICD-10-CM

## 2017-08-16 LAB — POCT INR: INR: 1.8

## 2017-08-16 NOTE — Patient Instructions (Signed)
Today take 1.5 tablets, then change your dose to 1 tablet daily except 1.5 tablets  on Mondays, Wednesdays and Fridays. Recheck in 2 weeks. Call if any new medications or changes # [phone]. Eat 2-3  servings of greens weekly.

## 2017-08-30 ENCOUNTER — Ambulatory Visit (INDEPENDENT_AMBULATORY_CARE_PROVIDER_SITE_OTHER): Payer: Medicare PPO | Admitting: *Deleted

## 2017-08-30 DIAGNOSIS — I482 Chronic atrial fibrillation: Secondary | ICD-10-CM

## 2017-08-30 DIAGNOSIS — Z5181 Encounter for therapeutic drug level monitoring: Secondary | ICD-10-CM

## 2017-08-30 DIAGNOSIS — I4821 Permanent atrial fibrillation: Secondary | ICD-10-CM

## 2017-08-30 DIAGNOSIS — I480 Paroxysmal atrial fibrillation: Secondary | ICD-10-CM | POA: Diagnosis not present

## 2017-08-30 LAB — POCT INR: INR: 1.7

## 2017-08-30 NOTE — Patient Instructions (Signed)
Description   Today take 1.5 tablets, then continue taking 1 tablet daily except 1.5 tablets  on Mondays, Wednesdays and Fridays. Recheck in 2 weeks. Call if any new medications or changes # [phone]. Eat 2-3 servings of greens weekly.

## 2017-09-14 ENCOUNTER — Ambulatory Visit (INDEPENDENT_AMBULATORY_CARE_PROVIDER_SITE_OTHER): Payer: Medicare PPO

## 2017-09-14 DIAGNOSIS — I480 Paroxysmal atrial fibrillation: Secondary | ICD-10-CM | POA: Diagnosis not present

## 2017-09-14 DIAGNOSIS — I482 Chronic atrial fibrillation: Secondary | ICD-10-CM

## 2017-09-14 DIAGNOSIS — I4821 Permanent atrial fibrillation: Secondary | ICD-10-CM

## 2017-09-14 DIAGNOSIS — Z5181 Encounter for therapeutic drug level monitoring: Secondary | ICD-10-CM

## 2017-09-14 LAB — POCT INR: INR: 2.2

## 2017-09-14 NOTE — Patient Instructions (Signed)
Continue on same dosage 1 tablet daily except 1.5 tablets on Mondays, Wednesdays and Fridays. Recheck in 3 weeks. Call if any new medications or changes # [phone]. Eat 2-3 servings of greens weekly.

## 2017-10-05 ENCOUNTER — Encounter (INDEPENDENT_AMBULATORY_CARE_PROVIDER_SITE_OTHER): Payer: Self-pay

## 2017-10-05 ENCOUNTER — Ambulatory Visit (INDEPENDENT_AMBULATORY_CARE_PROVIDER_SITE_OTHER): Payer: Medicare PPO | Admitting: *Deleted

## 2017-10-05 DIAGNOSIS — Z5181 Encounter for therapeutic drug level monitoring: Secondary | ICD-10-CM

## 2017-10-05 DIAGNOSIS — I482 Chronic atrial fibrillation: Secondary | ICD-10-CM

## 2017-10-05 DIAGNOSIS — I4821 Permanent atrial fibrillation: Secondary | ICD-10-CM

## 2017-10-05 DIAGNOSIS — I480 Paroxysmal atrial fibrillation: Secondary | ICD-10-CM | POA: Diagnosis not present

## 2017-10-05 LAB — POCT INR: INR: 1.8

## 2017-10-05 NOTE — Patient Instructions (Signed)
Description   Today Jan 23rd take 2 tablets (6mg ) then continue on same dosage 1 tablet daily except 1.5 tablets on Mondays, Wednesdays and Fridays. Recheck in 2 weeks. Call if any new medications or changes # [phone]. Eat 2-3 servings of greens weekly.

## 2017-10-11 ENCOUNTER — Ambulatory Visit (INDEPENDENT_AMBULATORY_CARE_PROVIDER_SITE_OTHER): Payer: Medicare PPO | Admitting: *Deleted

## 2017-10-11 ENCOUNTER — Telehealth: Payer: Self-pay | Admitting: Cardiology

## 2017-10-11 DIAGNOSIS — I5022 Chronic systolic (congestive) heart failure: Secondary | ICD-10-CM

## 2017-10-11 NOTE — Telephone Encounter (Signed)
Spoke with pt and reminded pt of remote transmission that is due today. Pt verbalized understanding.   

## 2017-10-11 NOTE — Progress Notes (Signed)
Remote ICD transmission.   

## 2017-10-13 ENCOUNTER — Encounter: Payer: Self-pay | Admitting: Cardiology

## 2017-10-14 ENCOUNTER — Encounter: Payer: Self-pay | Admitting: Interventional Cardiology

## 2017-10-14 ENCOUNTER — Ambulatory Visit (INDEPENDENT_AMBULATORY_CARE_PROVIDER_SITE_OTHER): Payer: Medicare PPO | Admitting: Interventional Cardiology

## 2017-10-14 VITALS — BP 104/72 | HR 78 | Ht 71.0 in | Wt 161.8 lb

## 2017-10-14 DIAGNOSIS — Z79899 Other long term (current) drug therapy: Secondary | ICD-10-CM | POA: Diagnosis not present

## 2017-10-14 DIAGNOSIS — I480 Paroxysmal atrial fibrillation: Secondary | ICD-10-CM

## 2017-10-14 DIAGNOSIS — Z5181 Encounter for therapeutic drug level monitoring: Secondary | ICD-10-CM | POA: Diagnosis not present

## 2017-10-14 DIAGNOSIS — I5022 Chronic systolic (congestive) heart failure: Secondary | ICD-10-CM

## 2017-10-14 DIAGNOSIS — N184 Chronic kidney disease, stage 4 (severe): Secondary | ICD-10-CM

## 2017-10-14 NOTE — Patient Instructions (Signed)
Medication Instructions:  Your physician recommends that you continue on your current medications as directed. Please refer to the Current Medication list given to you today.  Labwork: BMET and Pro BNP today  Testing/Procedures: None  Follow-Up: Your physician wants you to follow-up in: 1 year with Dr. Katrinka Blazing.  You will receive a reminder letter in the mail two months in advance. If you don't receive a letter, please call our office to schedule the follow-up appointment.   Any Other Special Instructions Will Be Listed Below (If Applicable).     If you need a refill on your cardiac medications before your next appointment, please call your pharmacy.

## 2017-10-14 NOTE — Progress Notes (Signed)
Cardiology Office Note    Date:  10/14/2017   ID:  Tyler Gray, DOB January 02, 1933, MRN [id number]  PCP:  Renaye Rakers, MD  Cardiologist: Lesleigh Noe, MD   No chief complaint on file.   History of Present Illness:  Tyler Gray is a 84 y.o. male  follow-up of nonischemic cardiomyopathy, paroxysmal atrial fibrillation on amiodarone for rhythm control, chronic systolic heart failure, AICD, hyperlipidemia, and chronic kidney disease stage III .  He has no cardiac complaints.  He saw Dr. Ladona Ridgel in August.  He denies chest pain.  Has aches and pains in hands and feet.  History of gout.  Dr. Parke Simmers is now his primary care physician.  I did note his medication regimen includes 12.5 mg of Aldactone, 20 mEq of potassium daily, and furosemide.  Last blood work that I can find is from August.  Creatinine was 2.3 at that time.   Past Medical History:  Diagnosis Date  . Atrial fibrillation (HCC)   . Chronic systolic heart failure (HCC)   . Hyperlipidemia   . ICD-St.Jude   . Renal failure    acute    Past Surgical History:  Procedure Laterality Date  . BIV ICD GENERATOR CHANGEOUT N/A 04/13/2017   Procedure: BiV ICD Generator Changeout;  Surgeon: Marinus Maw, MD;  Location: [facility] INVASIVE CV LAB;  Service: Cardiovascular;  Laterality: N/A;  . CARDIAC DEFIBRILLATOR PLACEMENT    . FLUOROSCOPY GUIDANCE N/A 04/27/2017   Procedure: FLUOROSCOPY GUIDANCE;  Surgeon: Duke Salvia, MD;  Location: [facility] INVASIVE CV LAB;  Service: Cardiovascular;  Laterality: N/A;  . POLYPECTOMY      Current Medications: Outpatient Medications Prior to Visit  Medication Sig Dispense Refill  . acetaminophen (TYLENOL) 325 MG tablet Take 2 tablets (650 mg total) by mouth every 6 (six) hours as needed. 30 tablet 0  . atorvastatin (LIPITOR) 40 MG tablet TAKE 1 TABLET EVERY DAY  AT  6PM 90 tablet 2  . carvedilol (COREG) 6.25 MG tablet Take 0.5 tablets (3.125 mg total) by mouth 2 (two) times daily with a meal.    .  furosemide (LASIX) 40 MG tablet Take 20 mg by mouth daily.     [name] KLOR-CON M20 20 MEQ tablet TAKE 1 TABLET EVERY DAY 90 tablet 2  . spironolactone (ALDACTONE) 25 MG tablet TAKE 1/2 TABLET EVERY DAY 45 tablet 2  . triamcinolone cream (KENALOG) 0.1 % Apply 1 application topically daily.   0  . warfarin (COUMADIN) 3 MG tablet TAKE AS DIRECTED BY COUMADIN CLINIC. 110 tablet 0   No facility-administered medications prior to visit.      Allergies:   Patient has no known allergies.   Social History   Socioeconomic History  . Marital status: Married    Spouse name: None  . Number of children: 2  . Years of education: None  . Highest education level: None  Social Needs  . Financial resource strain: None  . Food insecurity - worry: None  . Food insecurity - inability: None  . Transportation needs - medical: None  . Transportation needs - non-medical: None  Occupational History  . Occupation: retired Environmental manager  Tobacco Use  . Smoking status: Never Smoker  . Smokeless tobacco: Never Used  Substance and Sexual Activity  . Alcohol use: No  . Drug use: No  . Sexual activity: None  Other Topics Concern  . None  Social History Narrative  . None     Family History:  The patient's family history includes Heart failure in his father and mother; Hypertension in his father and mother; Other in his unknown relative.   ROS:   Please see the history of present illness.    None  All other systems reviewed and are negative.   PHYSICAL EXAM:   VS:  BP 104/72   Pulse 78   Ht 5\' 11"  (1.803 m)   Wt 161 lb 12.8 oz (73.4 kg)   SpO2 99%   BMI 22.57 kg/m    GEN: Well nourished, well developed, in no acute distress  HEENT: normal  Neck: no JVD, carotid bruits, or masses Cardiac: RRR; no murmurs, rubs, or gallops,no edema  Respiratory:  clear to auscultation bilaterally, normal work of breathing GI: soft, nontender, nondistended, + BS MS: no deformity or atrophy  Skin: warm and dry, no  rash Neuro:  Alert and Oriented x 3, Strength and sensation are intact Psych: euthymic mood, full affect  Wt Readings from Last 3 Encounters:  10/14/17 161 lb 12.8 oz (73.4 kg)  07/12/17 165 lb (74.8 kg)  05/03/17 164 lb (74.4 kg)      Studies/Labs Reviewed:   EKG:  EKG not repeated.  Tracing from October 2018 reveals AV sequential pacing.  Recent Labs: 03/29/2017: ALT 13; Hemoglobin 10.5; Platelets 104; TSH 2.530 04/13/2017: BUN 27; Creatinine, Ser 2.34; Potassium 4.8; Sodium 140   Lipid Panel No results found for: CHOL, TRIG, HDL, CHOLHDL, VLDL, LDLCALC, LDLDIRECT  Additional studies/ records that were reviewed today include:  No recent functional testing. Last echocardiogram 8 years ago.  Chronic severe systolic dysfunction.   ASSESSMENT:    1. Paroxysmal atrial fibrillation (HCC)   2. CHRONIC SYSTOLIC HEART FAILURE   3. Chronic kidney disease, stage IV (severe) (HCC)   4. On amiodarone therapy   5. Encounter for therapeutic drug monitoring      PLAN:  In order of problems listed above:  1. No recent evidence of atrial fibrillation with last EKG indicating AV sequential pacing.  On Coumadin therapy. 2. LVEF known to be less than 30%.  No recent assessment.  Patient is doing well. 3. With chronic kidney disease on the combination of medications noted above, will check a BNP and basic metabolic panel today.  No medication changes are indicated at this time. 4. Amiodarone has been discontinued. 5. Coumadin therapy without complications.  Clinical follow-up in 1 year.  We will remove amiodarone therapy from the problem list.    Medication Adjustments/Labs and Tests Ordered: Current medicines are reviewed at length with the patient today.  Concerns regarding medicines are outlined above.  Medication changes, Labs and Tests ordered today are listed in the Patient Instructions below. Patient Instructions  Medication Instructions:  Your physician recommends that you  continue on your current medications as directed. Please refer to the Current Medication list given to you today.  Labwork: BMET and Pro BNP today  Testing/Procedures: None  Follow-Up: Your physician wants you to follow-up in: 1 year with Dr. Katrinka Blazing.  You will receive a reminder letter in the mail two months in advance. If you don't receive a letter, please call our office to schedule the follow-up appointment.   Any Other Special Instructions Will Be Listed Below (If Applicable).     If you need a refill on your cardiac medications before your next appointment, please call your pharmacy.      Signed, Lesleigh Noe, MD  10/14/2017 12:05 PM    [facility]  [place], [place], [place]  [address] Phone: [phone]; Fax: [phone]

## 2017-10-15 LAB — BASIC METABOLIC PANEL
BUN/Creatinine Ratio: 15 (ref 10–24)
BUN: 40 mg/dL — ABNORMAL HIGH (ref 8–27)
CO2: 18 mmol/L — AB (ref 20–29)
CREATININE: 2.72 mg/dL — AB (ref 0.76–1.27)
Calcium: 8.7 mg/dL (ref 8.6–10.2)
Chloride: 113 mmol/L — ABNORMAL HIGH (ref 96–106)
GFR, EST AFRICAN AMERICAN: 24 mL/min/{1.73_m2} — AB (ref >59)
GFR, EST NON AFRICAN AMERICAN: 21 mL/min/{1.73_m2} — AB (ref >59)
Glucose: 96 mg/dL (ref 65–99)
Potassium: 5.6 mmol/L — ABNORMAL HIGH (ref 3.5–5.2)
SODIUM: 144 mmol/L (ref 134–144)

## 2017-10-15 LAB — PRO B NATRIURETIC PEPTIDE: NT-Pro BNP: 15115 pg/mL — ABNORMAL HIGH (ref 0–486)

## 2017-10-17 ENCOUNTER — Telehealth: Payer: Self-pay | Admitting: *Deleted

## 2017-10-17 DIAGNOSIS — I5022 Chronic systolic (congestive) heart failure: Secondary | ICD-10-CM

## 2017-10-17 NOTE — Telephone Encounter (Signed)
-----   Message from Lyn Records, MD sent at 10/15/2017 10:14 AM EST ----- Let the patient know potassium and kidney function are worse. Stop spironolactone, and potassium. check BMET 1 week. May resume potassium at that time. A copy will be sent to Renaye Rakers, MD

## 2017-10-17 NOTE — Telephone Encounter (Signed)
Spoke with pt and went over lab results and recommendations per Dr. Katrinka Blazing.  Pt repeated instructions and verbalized understanding.  Pt will repeat labs 2/11.

## 2017-10-25 ENCOUNTER — Other Ambulatory Visit: Payer: Medicare PPO

## 2017-10-26 ENCOUNTER — Ambulatory Visit (INDEPENDENT_AMBULATORY_CARE_PROVIDER_SITE_OTHER): Payer: Medicare PPO | Admitting: *Deleted

## 2017-10-26 DIAGNOSIS — Z5181 Encounter for therapeutic drug level monitoring: Secondary | ICD-10-CM | POA: Diagnosis not present

## 2017-10-26 DIAGNOSIS — I480 Paroxysmal atrial fibrillation: Secondary | ICD-10-CM

## 2017-10-26 DIAGNOSIS — I5022 Chronic systolic (congestive) heart failure: Secondary | ICD-10-CM

## 2017-10-26 LAB — CUP PACEART REMOTE DEVICE CHECK
Battery Voltage: 3.05 V
HighPow Impedance: 34 Ohm
HighPow Impedance: 35 Ohm
Implantable Lead Implant Date: 20110211
Implantable Lead Location: 753859
Implantable Lead Location: 753860
Implantable Lead Model: 7121
Implantable Pulse Generator Implant Date: 20180801
Lead Channel Impedance Value: 600 Ohm
Lead Channel Pacing Threshold Amplitude: 1.25 V
Lead Channel Pacing Threshold Pulse Width: 0.4 ms
Lead Channel Pacing Threshold Pulse Width: 0.5 ms
Lead Channel Setting Pacing Amplitude: 2.25 V
Lead Channel Setting Pacing Pulse Width: 0.5 ms
Lead Channel Setting Sensing Sensitivity: 0.5 mV
MDC IDC LEAD IMPLANT DT: 20110211
MDC IDC LEAD IMPLANT DT: 20110211
MDC IDC LEAD LOCATION: 753858
MDC IDC MSMT BATTERY REMAINING LONGEVITY: 79 mo
MDC IDC MSMT BATTERY REMAINING PERCENTAGE: 86 %
MDC IDC MSMT LEADCHNL RV IMPEDANCE VALUE: 430 Ohm
MDC IDC MSMT LEADCHNL RV PACING THRESHOLD AMPLITUDE: 0.5 V
MDC IDC MSMT LEADCHNL RV SENSING INTR AMPL: 12 mV
MDC IDC PG SERIAL: 7377399
MDC IDC SESS DTM: 20190129181132
MDC IDC SET LEADCHNL LV PACING PULSEWIDTH: 0.4 ms
MDC IDC SET LEADCHNL RV PACING AMPLITUDE: 2.5 V

## 2017-10-26 LAB — POCT INR: INR: 1.6

## 2017-10-26 NOTE — Patient Instructions (Signed)
Description   Today take 2 tablets then start taking 1.5 tablets daily except 1 tablet on Tuesdays, Thursdays, and Saturdays.  Recheck in 2 weeks. Call if any new medications or changes # [phone]. Eat 2-3 servings of greens weekly.

## 2017-10-27 LAB — BASIC METABOLIC PANEL
BUN / CREAT RATIO: 16 (ref 10–24)
BUN: 44 mg/dL — AB (ref 8–27)
CALCIUM: 8.4 mg/dL — AB (ref 8.6–10.2)
CHLORIDE: 107 mmol/L — AB (ref 96–106)
CO2: 18 mmol/L — ABNORMAL LOW (ref 20–29)
Creatinine, Ser: 2.71 mg/dL — ABNORMAL HIGH (ref 0.76–1.27)
GFR, EST AFRICAN AMERICAN: 24 mL/min/{1.73_m2} — AB (ref >59)
GFR, EST NON AFRICAN AMERICAN: 21 mL/min/{1.73_m2} — AB (ref >59)
Glucose: 91 mg/dL (ref 65–99)
POTASSIUM: 4.5 mmol/L (ref 3.5–5.2)
Sodium: 141 mmol/L (ref 134–144)

## 2017-10-31 ENCOUNTER — Other Ambulatory Visit: Payer: Self-pay | Admitting: *Deleted

## 2017-10-31 DIAGNOSIS — I5022 Chronic systolic (congestive) heart failure: Secondary | ICD-10-CM

## 2017-11-22 ENCOUNTER — Other Ambulatory Visit: Payer: Medicare PPO

## 2017-12-07 ENCOUNTER — Encounter (INDEPENDENT_AMBULATORY_CARE_PROVIDER_SITE_OTHER): Payer: Self-pay

## 2017-12-07 ENCOUNTER — Ambulatory Visit (INDEPENDENT_AMBULATORY_CARE_PROVIDER_SITE_OTHER): Payer: Medicare PPO | Admitting: *Deleted

## 2017-12-07 DIAGNOSIS — I480 Paroxysmal atrial fibrillation: Secondary | ICD-10-CM | POA: Diagnosis not present

## 2017-12-07 DIAGNOSIS — I4821 Permanent atrial fibrillation: Secondary | ICD-10-CM

## 2017-12-07 DIAGNOSIS — Z5181 Encounter for therapeutic drug level monitoring: Secondary | ICD-10-CM

## 2017-12-07 DIAGNOSIS — I482 Chronic atrial fibrillation: Secondary | ICD-10-CM | POA: Diagnosis not present

## 2017-12-07 LAB — POCT INR: INR: 1.5

## 2017-12-07 NOTE — Patient Instructions (Signed)
Description   Today March 27th take 2 tablets then  tomorrow March 28th take 1 and 1/2 tablets then continue  taking 1 and 1/2 tablets daily except 1 tablet on Tuesdays, Thursdays, and Saturdays.  Recheck in 1 week. Call if any new medications or changes # [phone].

## 2017-12-16 ENCOUNTER — Ambulatory Visit (INDEPENDENT_AMBULATORY_CARE_PROVIDER_SITE_OTHER): Payer: Medicare PPO | Admitting: *Deleted

## 2017-12-16 DIAGNOSIS — I480 Paroxysmal atrial fibrillation: Secondary | ICD-10-CM | POA: Diagnosis not present

## 2017-12-16 DIAGNOSIS — Z5181 Encounter for therapeutic drug level monitoring: Secondary | ICD-10-CM

## 2017-12-16 DIAGNOSIS — I4821 Permanent atrial fibrillation: Secondary | ICD-10-CM

## 2017-12-16 DIAGNOSIS — I482 Chronic atrial fibrillation: Secondary | ICD-10-CM | POA: Diagnosis not present

## 2017-12-16 LAB — POCT INR: INR: 1.6

## 2017-12-16 NOTE — Patient Instructions (Signed)
Description   Today 2 tablets and 1.5 tablets tomorrow then start taking 1 and 1/2 tablets daily except 1 tablet on Thursdays and Saturdays.  Recheck in 1 week. Call if any new medications or changes # [phone].

## 2017-12-23 ENCOUNTER — Ambulatory Visit (INDEPENDENT_AMBULATORY_CARE_PROVIDER_SITE_OTHER): Payer: Medicare PPO | Admitting: Pharmacist

## 2017-12-23 DIAGNOSIS — I482 Chronic atrial fibrillation: Secondary | ICD-10-CM

## 2017-12-23 DIAGNOSIS — I480 Paroxysmal atrial fibrillation: Secondary | ICD-10-CM

## 2017-12-23 DIAGNOSIS — Z5181 Encounter for therapeutic drug level monitoring: Secondary | ICD-10-CM | POA: Diagnosis not present

## 2017-12-23 DIAGNOSIS — I4821 Permanent atrial fibrillation: Secondary | ICD-10-CM

## 2017-12-23 LAB — POCT INR: INR: 1.7

## 2017-12-23 NOTE — Patient Instructions (Signed)
Description   Today 2 tablets then start taking 1 and 1/2 tablets daily except 1 tablet on Thursdays.  Recheck in 1 week. Call if any new medications or changes #[phone].

## 2017-12-27 ENCOUNTER — Telehealth: Payer: Self-pay | Admitting: Interventional Cardiology

## 2017-12-27 NOTE — Telephone Encounter (Signed)
Spoke with patient's wife. She is concerned that pt has SOB.  Does not usually.  Walks to mailbox with cane, stops briefly then walks back and is out of breath.  His feet and ankles are a little swollen, indents from socks on legs.  Reviewed medications.  Taking as ordered.  Lying flat to sleep.  Reports pt has been eating a lot at [employer].  Reviewed sodium and heart failure.   Advised reduce sodium, begin to do daily weights, call back on Friday if breathing is not any better.  Mrs. Nickum is in agreement with this plan and thanked me for call.

## 2017-12-27 NOTE — Telephone Encounter (Signed)
Follow up    Patients wife is calling back in reference to husband having shortness of breath. Please call to discuss.    Pt c/o Shortness Of Breath: STAT if SOB developed within the last 24 hours or pt is noticeably SOB on the phone  1. Are you currently SOB (can you hear that pt is SOB on the phone)? Spoke with patients wife  2. How long have you been experiencing SOB? Around 2-3 days  3. Are you SOB when sitting or when up moving around? both  4. Are you currently experiencing any other symptoms? No other symptoms

## 2017-12-27 NOTE — Telephone Encounter (Signed)
New Message  Pt c/o Shortness Of Breath: STAT if SOB developed within the last 24 hours or pt is noticeably SOB on the phone  1. Are you currently SOB (can you hear that pt is SOB on the phone)? Wife on the phone  2. How long have you been experiencing SOB? 2-3 days  3. Are you SOB when sitting or when up moving around? both  4. Are you currently experiencing any other symptoms? no

## 2018-01-02 ENCOUNTER — Ambulatory Visit (INDEPENDENT_AMBULATORY_CARE_PROVIDER_SITE_OTHER): Payer: Medicare PPO | Admitting: *Deleted

## 2018-01-02 DIAGNOSIS — I482 Chronic atrial fibrillation: Secondary | ICD-10-CM

## 2018-01-02 DIAGNOSIS — I4821 Permanent atrial fibrillation: Secondary | ICD-10-CM

## 2018-01-02 DIAGNOSIS — I480 Paroxysmal atrial fibrillation: Secondary | ICD-10-CM | POA: Diagnosis not present

## 2018-01-02 DIAGNOSIS — Z5181 Encounter for therapeutic drug level monitoring: Secondary | ICD-10-CM

## 2018-01-02 LAB — POCT INR: INR: 3.9

## 2018-01-02 NOTE — Telephone Encounter (Signed)
Noted  

## 2018-01-02 NOTE — Patient Instructions (Signed)
Description   Hold today's dose then continue taking 1 and 1/2 tablets daily except 1 tablet on Thursdays.  Recheck in 10 days. Call if any new medications or changes #[phone].

## 2018-01-10 ENCOUNTER — Ambulatory Visit (INDEPENDENT_AMBULATORY_CARE_PROVIDER_SITE_OTHER): Payer: Medicare PPO | Admitting: *Deleted

## 2018-01-10 DIAGNOSIS — I5022 Chronic systolic (congestive) heart failure: Secondary | ICD-10-CM | POA: Diagnosis not present

## 2018-01-10 NOTE — Progress Notes (Signed)
Remote ICD transmission.   

## 2018-01-11 ENCOUNTER — Encounter: Payer: Self-pay | Admitting: Cardiology

## 2018-01-12 ENCOUNTER — Encounter (INDEPENDENT_AMBULATORY_CARE_PROVIDER_SITE_OTHER): Payer: Self-pay

## 2018-01-12 ENCOUNTER — Ambulatory Visit (INDEPENDENT_AMBULATORY_CARE_PROVIDER_SITE_OTHER): Payer: Medicare PPO | Admitting: *Deleted

## 2018-01-12 DIAGNOSIS — I4821 Permanent atrial fibrillation: Secondary | ICD-10-CM

## 2018-01-12 DIAGNOSIS — I480 Paroxysmal atrial fibrillation: Secondary | ICD-10-CM

## 2018-01-12 DIAGNOSIS — I482 Chronic atrial fibrillation: Secondary | ICD-10-CM | POA: Diagnosis not present

## 2018-01-12 DIAGNOSIS — Z5181 Encounter for therapeutic drug level monitoring: Secondary | ICD-10-CM | POA: Diagnosis not present

## 2018-01-12 LAB — POCT INR: INR: 4.4

## 2018-01-12 NOTE — Patient Instructions (Signed)
Description   Hold today's dose then start taking 1.5 tablets everyday except 1 tablet on Mondays, Wednesdays and Fridays.  Recheck in 2 weeks.  Call if any new medications or changes #[phone].

## 2018-01-16 ENCOUNTER — Other Ambulatory Visit: Payer: Self-pay | Admitting: Interventional Cardiology

## 2018-01-17 ENCOUNTER — Other Ambulatory Visit: Payer: Self-pay | Admitting: Interventional Cardiology

## 2018-01-17 MED ORDER — FUROSEMIDE 40 MG PO TABS: 20.0000 mg | ORAL_TABLET | Freq: Every day | ORAL | 3 refills | Status: DC

## 2018-01-17 MED ORDER — CARVEDILOL 6.25 MG PO TABS: 3.1250 mg | ORAL_TABLET | Freq: Two times a day (BID) | ORAL | 3 refills | Status: DC

## 2018-01-19 ENCOUNTER — Telehealth: Payer: Self-pay | Admitting: Interventional Cardiology

## 2018-01-19 MED ORDER — CARVEDILOL 6.25 MG PO TABS: 3.1250 mg | ORAL_TABLET | Freq: Two times a day (BID) | ORAL | 3 refills | Status: DC

## 2018-01-19 MED ORDER — WARFARIN SODIUM 3 MG PO TABS: ORAL_TABLET | ORAL | 0 refills | Status: DC

## 2018-01-19 NOTE — Telephone Encounter (Signed)
New message     Patient is requesting a 10 day supply to local , while waiting on [facility] mail order.   . *STAT* If patient is at the pharmacy, call can be transferred to refill team.   1. Which medications need to be refilled? (please list name of each medication and dose if known)warfarin (COUMADIN) 3 MG tablet  2. Which pharmacy/location (including street and city if local pharmacy) is medication to be sent to?[facility] [phone] - [place], [place] - [address]  3. Do they need a 30 day or 90 day supply? 10 day

## 2018-01-19 NOTE — Addendum Note (Signed)
Addended by: Demetrios Loll on: 01/19/2018 11:02 AM   Modules accepted: Orders

## 2018-01-19 NOTE — Telephone Encounter (Signed)
Short supply RX sent as requested.

## 2018-01-26 ENCOUNTER — Ambulatory Visit (INDEPENDENT_AMBULATORY_CARE_PROVIDER_SITE_OTHER): Payer: Medicare PPO | Admitting: *Deleted

## 2018-01-26 DIAGNOSIS — I480 Paroxysmal atrial fibrillation: Secondary | ICD-10-CM | POA: Diagnosis not present

## 2018-01-26 DIAGNOSIS — I482 Chronic atrial fibrillation: Secondary | ICD-10-CM

## 2018-01-26 DIAGNOSIS — I4821 Permanent atrial fibrillation: Secondary | ICD-10-CM

## 2018-01-26 DIAGNOSIS — Z5181 Encounter for therapeutic drug level monitoring: Secondary | ICD-10-CM

## 2018-01-26 LAB — POCT INR: INR: 2.5

## 2018-01-26 NOTE — Patient Instructions (Signed)
Description   Continue  taking 1.5 tablets everyday except 1 tablet on Mondays, Wednesdays and Fridays.  Recheck in 3 weeks.  Call if any new medications or changes #[phone].

## 2018-01-31 LAB — CUP PACEART REMOTE DEVICE CHECK
Battery Remaining Longevity: 76 mo
HighPow Impedance: 33 Ohm
HighPow Impedance: 33 Ohm
Implantable Lead Implant Date: 20110211
Implantable Lead Location: 753860
Implantable Lead Model: 4196
Implantable Pulse Generator Implant Date: 20180801
Lead Channel Impedance Value: 460 Ohm
Lead Channel Impedance Value: 580 Ohm
Lead Channel Pacing Threshold Amplitude: 1.25 V
Lead Channel Pacing Threshold Pulse Width: 0.5 ms
Lead Channel Setting Pacing Amplitude: 2.25 V
Lead Channel Setting Pacing Pulse Width: 0.5 ms
MDC IDC LEAD IMPLANT DT: 20110211
MDC IDC LEAD IMPLANT DT: 20110211
MDC IDC LEAD LOCATION: 753858
MDC IDC LEAD LOCATION: 753859
MDC IDC MSMT BATTERY REMAINING PERCENTAGE: 83 %
MDC IDC MSMT BATTERY VOLTAGE: 3.02 V
MDC IDC MSMT LEADCHNL LV PACING THRESHOLD PULSEWIDTH: 0.4 ms
MDC IDC MSMT LEADCHNL RV PACING THRESHOLD AMPLITUDE: 0.5 V
MDC IDC MSMT LEADCHNL RV SENSING INTR AMPL: 12 mV
MDC IDC PG SERIAL: 7377399
MDC IDC SESS DTM: 20190430093154
MDC IDC SET LEADCHNL LV PACING PULSEWIDTH: 0.4 ms
MDC IDC SET LEADCHNL RV PACING AMPLITUDE: 2.5 V
MDC IDC SET LEADCHNL RV SENSING SENSITIVITY: 0.5 mV

## 2018-03-01 ENCOUNTER — Ambulatory Visit (INDEPENDENT_AMBULATORY_CARE_PROVIDER_SITE_OTHER): Payer: Medicare PPO | Admitting: *Deleted

## 2018-03-01 DIAGNOSIS — Z5181 Encounter for therapeutic drug level monitoring: Secondary | ICD-10-CM

## 2018-03-01 DIAGNOSIS — I482 Chronic atrial fibrillation: Secondary | ICD-10-CM | POA: Diagnosis not present

## 2018-03-01 DIAGNOSIS — I480 Paroxysmal atrial fibrillation: Secondary | ICD-10-CM

## 2018-03-01 DIAGNOSIS — I4821 Permanent atrial fibrillation: Secondary | ICD-10-CM

## 2018-03-01 LAB — POCT INR: INR: 2.8 (ref 2.0–3.0)

## 2018-03-01 NOTE — Patient Instructions (Signed)
Description   Continue  taking 1.5 tablets everyday except 1 tablet on Mondays, Wednesdays and Fridays.  Recheck in 4 weeks.  Call if any new medications or changes #[phone].

## 2018-03-08 ENCOUNTER — Emergency Department (HOSPITAL_COMMUNITY): Payer: Medicare PPO

## 2018-03-08 ENCOUNTER — Inpatient Hospital Stay (HOSPITAL_COMMUNITY)
Admission: EM | Admit: 2018-03-08 | Discharge: 2018-03-27 | DRG: 291 | Disposition: A | Discharge status: Hospice | Payer: Medicare PPO | Attending: Family Medicine | Admitting: Family Medicine

## 2018-03-08 ENCOUNTER — Other Ambulatory Visit: Payer: Self-pay

## 2018-03-08 ENCOUNTER — Encounter (HOSPITAL_COMMUNITY): Payer: Self-pay

## 2018-03-08 DIAGNOSIS — N136 Pyonephrosis: Secondary | ICD-10-CM | POA: Diagnosis present

## 2018-03-08 DIAGNOSIS — I248 Other forms of acute ischemic heart disease: Secondary | ICD-10-CM | POA: Diagnosis present

## 2018-03-08 DIAGNOSIS — N189 Chronic kidney disease, unspecified: Secondary | ICD-10-CM | POA: Diagnosis not present

## 2018-03-08 DIAGNOSIS — Z7189 Other specified counseling: Secondary | ICD-10-CM

## 2018-03-08 DIAGNOSIS — R531 Weakness: Secondary | ICD-10-CM | POA: Diagnosis present

## 2018-03-08 DIAGNOSIS — R64 Cachexia: Secondary | ICD-10-CM | POA: Diagnosis present

## 2018-03-08 DIAGNOSIS — R627 Adult failure to thrive: Secondary | ICD-10-CM | POA: Diagnosis present

## 2018-03-08 DIAGNOSIS — M109 Gout, unspecified: Secondary | ICD-10-CM | POA: Diagnosis present

## 2018-03-08 DIAGNOSIS — K7460 Unspecified cirrhosis of liver: Secondary | ICD-10-CM | POA: Diagnosis present

## 2018-03-08 DIAGNOSIS — N210 Calculus in bladder: Secondary | ICD-10-CM | POA: Diagnosis present

## 2018-03-08 DIAGNOSIS — M1990 Unspecified osteoarthritis, unspecified site: Secondary | ICD-10-CM | POA: Diagnosis present

## 2018-03-08 DIAGNOSIS — N179 Acute kidney failure, unspecified: Secondary | ICD-10-CM | POA: Diagnosis present

## 2018-03-08 DIAGNOSIS — Z7901 Long term (current) use of anticoagulants: Secondary | ICD-10-CM

## 2018-03-08 DIAGNOSIS — J9811 Atelectasis: Secondary | ICD-10-CM | POA: Diagnosis present

## 2018-03-08 DIAGNOSIS — I5022 Chronic systolic (congestive) heart failure: Secondary | ICD-10-CM | POA: Diagnosis present

## 2018-03-08 DIAGNOSIS — W19XXXA Unspecified fall, initial encounter: Secondary | ICD-10-CM | POA: Diagnosis present

## 2018-03-08 DIAGNOSIS — R7989 Other specified abnormal findings of blood chemistry: Secondary | ICD-10-CM | POA: Diagnosis present

## 2018-03-08 DIAGNOSIS — D638 Anemia in other chronic diseases classified elsewhere: Secondary | ICD-10-CM | POA: Diagnosis present

## 2018-03-08 DIAGNOSIS — R791 Abnormal coagulation profile: Secondary | ICD-10-CM | POA: Diagnosis present

## 2018-03-08 DIAGNOSIS — R197 Diarrhea, unspecified: Secondary | ICD-10-CM | POA: Diagnosis not present

## 2018-03-08 DIAGNOSIS — I482 Chronic atrial fibrillation: Secondary | ICD-10-CM | POA: Diagnosis present

## 2018-03-08 DIAGNOSIS — N390 Urinary tract infection, site not specified: Secondary | ICD-10-CM | POA: Diagnosis not present

## 2018-03-08 DIAGNOSIS — I480 Paroxysmal atrial fibrillation: Secondary | ICD-10-CM | POA: Diagnosis not present

## 2018-03-08 DIAGNOSIS — E860 Dehydration: Secondary | ICD-10-CM | POA: Diagnosis present

## 2018-03-08 DIAGNOSIS — I351 Nonrheumatic aortic (valve) insufficiency: Secondary | ICD-10-CM | POA: Diagnosis not present

## 2018-03-08 DIAGNOSIS — Z79899 Other long term (current) drug therapy: Secondary | ICD-10-CM

## 2018-03-08 DIAGNOSIS — Z95810 Presence of automatic (implantable) cardiac defibrillator: Secondary | ICD-10-CM | POA: Diagnosis not present

## 2018-03-08 DIAGNOSIS — Z66 Do not resuscitate: Secondary | ICD-10-CM | POA: Diagnosis not present

## 2018-03-08 DIAGNOSIS — D696 Thrombocytopenia, unspecified: Secondary | ICD-10-CM | POA: Diagnosis not present

## 2018-03-08 DIAGNOSIS — E440 Moderate protein-calorie malnutrition: Secondary | ICD-10-CM | POA: Diagnosis present

## 2018-03-08 DIAGNOSIS — I5021 Acute systolic (congestive) heart failure: Secondary | ICD-10-CM | POA: Diagnosis present

## 2018-03-08 DIAGNOSIS — R778 Other specified abnormalities of plasma proteins: Secondary | ICD-10-CM | POA: Diagnosis present

## 2018-03-08 DIAGNOSIS — Z8249 Family history of ischemic heart disease and other diseases of the circulatory system: Secondary | ICD-10-CM

## 2018-03-08 DIAGNOSIS — I2510 Atherosclerotic heart disease of native coronary artery without angina pectoris: Secondary | ICD-10-CM | POA: Diagnosis present

## 2018-03-08 DIAGNOSIS — N184 Chronic kidney disease, stage 4 (severe): Secondary | ICD-10-CM | POA: Diagnosis present

## 2018-03-08 DIAGNOSIS — Z515 Encounter for palliative care: Secondary | ICD-10-CM | POA: Diagnosis not present

## 2018-03-08 DIAGNOSIS — N183 Chronic kidney disease, stage 3 (moderate): Secondary | ICD-10-CM | POA: Diagnosis not present

## 2018-03-08 DIAGNOSIS — I428 Other cardiomyopathies: Secondary | ICD-10-CM | POA: Diagnosis present

## 2018-03-08 DIAGNOSIS — R748 Abnormal levels of other serum enzymes: Secondary | ICD-10-CM

## 2018-03-08 DIAGNOSIS — E875 Hyperkalemia: Secondary | ICD-10-CM | POA: Diagnosis present

## 2018-03-08 DIAGNOSIS — L8990 Pressure ulcer of unspecified site, unspecified stage: Secondary | ICD-10-CM | POA: Diagnosis not present

## 2018-03-08 DIAGNOSIS — I071 Rheumatic tricuspid insufficiency: Secondary | ICD-10-CM | POA: Diagnosis present

## 2018-03-08 DIAGNOSIS — E872 Acidosis: Secondary | ICD-10-CM | POA: Diagnosis present

## 2018-03-08 DIAGNOSIS — E785 Hyperlipidemia, unspecified: Secondary | ICD-10-CM | POA: Diagnosis present

## 2018-03-08 DIAGNOSIS — I5023 Acute on chronic systolic (congestive) heart failure: Secondary | ICD-10-CM | POA: Diagnosis present

## 2018-03-08 DIAGNOSIS — Z6824 Body mass index (BMI) 24.0-24.9, adult: Secondary | ICD-10-CM

## 2018-03-08 DIAGNOSIS — I4891 Unspecified atrial fibrillation: Secondary | ICD-10-CM | POA: Diagnosis not present

## 2018-03-08 DIAGNOSIS — I130 Hypertensive heart and chronic kidney disease with heart failure and stage 1 through stage 4 chronic kidney disease, or unspecified chronic kidney disease: Principal | ICD-10-CM | POA: Diagnosis present

## 2018-03-08 DIAGNOSIS — E8809 Other disorders of plasma-protein metabolism, not elsewhere classified: Secondary | ICD-10-CM | POA: Diagnosis not present

## 2018-03-08 HISTORY — DX: Essential (primary) hypertension: I10

## 2018-03-08 HISTORY — DX: Personal history of urinary calculi: Z87.442

## 2018-03-08 HISTORY — DX: Pneumonia, unspecified organism: J18.9

## 2018-03-08 HISTORY — DX: Heart failure, unspecified: I50.9

## 2018-03-08 HISTORY — DX: Unspecified osteoarthritis, unspecified site: M19.90

## 2018-03-08 HISTORY — DX: Repeated falls: R29.6

## 2018-03-08 HISTORY — DX: Presence of automatic (implantable) cardiac defibrillator: Z95.810

## 2018-03-08 HISTORY — DX: Acute kidney failure, unspecified: N17.9

## 2018-03-08 HISTORY — DX: Personal history of other diseases of the musculoskeletal system and connective tissue: Z87.39

## 2018-03-08 HISTORY — DX: Personal history of other medical treatment: Z92.89

## 2018-03-08 LAB — URINALYSIS, ROUTINE W REFLEX MICROSCOPIC
BILIRUBIN URINE: NEGATIVE
GLUCOSE, UA: NEGATIVE mg/dL
KETONES UR: NEGATIVE mg/dL
NITRITE: NEGATIVE
PH: 5 (ref 5.0–8.0)
Protein, ur: 100 mg/dL — AB
Specific Gravity, Urine: 1.012 (ref 1.005–1.030)

## 2018-03-08 LAB — CBC WITH DIFFERENTIAL/PLATELET
ABS IMMATURE GRANULOCYTES: 0 10*3/uL (ref 0.0–0.1)
Basophils Absolute: 0 10*3/uL (ref 0.0–0.1)
Basophils Relative: 0 %
EOS ABS: 0.2 10*3/uL (ref 0.0–0.7)
Eosinophils Relative: 3 %
HEMATOCRIT: 28.8 % — AB (ref 39.0–52.0)
Hemoglobin: 8.7 g/dL — ABNORMAL LOW (ref 13.0–17.0)
IMMATURE GRANULOCYTES: 0 %
LYMPHS ABS: 0.6 10*3/uL — AB (ref 0.7–4.0)
Lymphocytes Relative: 11 %
MCH: 32.1 pg (ref 26.0–34.0)
MCHC: 30.2 g/dL (ref 30.0–36.0)
MCV: 106.3 fL — AB (ref 78.0–100.0)
MONOS PCT: 16 %
Monocytes Absolute: 0.8 10*3/uL (ref 0.1–1.0)
NEUTROS PCT: 70 %
Neutro Abs: 3.7 10*3/uL (ref 1.7–7.7)
Platelets: 100 10*3/uL — ABNORMAL LOW (ref 150–400)
RBC: 2.71 MIL/uL — ABNORMAL LOW (ref 4.22–5.81)
RDW: 15.5 % (ref 11.5–15.5)
WBC: 5.4 10*3/uL (ref 4.0–10.5)

## 2018-03-08 LAB — COMPREHENSIVE METABOLIC PANEL
ALT: 22 U/L (ref 0–44)
ANION GAP: 6 (ref 5–15)
AST: 30 U/L (ref 15–41)
Albumin: 2.6 g/dL — ABNORMAL LOW (ref 3.5–5.0)
Alkaline Phosphatase: 28 U/L — ABNORMAL LOW (ref 38–126)
BUN: 53 mg/dL — AB (ref 8–23)
CHLORIDE: 121 mmol/L — AB (ref 98–111)
CO2: 14 mmol/L — ABNORMAL LOW (ref 22–32)
CREATININE: 3.45 mg/dL — AB (ref 0.61–1.24)
Calcium: 8.3 mg/dL — ABNORMAL LOW (ref 8.9–10.3)
GFR, EST AFRICAN AMERICAN: 17 mL/min — AB (ref >60)
GFR, EST NON AFRICAN AMERICAN: 15 mL/min — AB (ref >60)
Glucose, Bld: 111 mg/dL — ABNORMAL HIGH (ref 70–99)
POTASSIUM: 5.7 mmol/L — AB (ref 3.5–5.1)
SODIUM: 141 mmol/L (ref 135–145)
Total Bilirubin: 1.7 mg/dL — ABNORMAL HIGH (ref 0.3–1.2)
Total Protein: 6.5 g/dL (ref 6.5–8.1)

## 2018-03-08 LAB — POC OCCULT BLOOD, ED: Fecal Occult Bld: NEGATIVE

## 2018-03-08 LAB — BRAIN NATRIURETIC PEPTIDE: B Natriuretic Peptide: 2285.4 pg/mL — ABNORMAL HIGH (ref 0.0–100.0)

## 2018-03-08 LAB — TROPONIN I: Troponin I: 0.06 ng/mL (ref <0.03)

## 2018-03-08 LAB — PROTIME-INR
INR: 3.69
Prothrombin Time: 36.3 seconds — ABNORMAL HIGH (ref 11.4–15.2)

## 2018-03-08 LAB — CBG MONITORING, ED: Glucose-Capillary: 105 mg/dL — ABNORMAL HIGH (ref 70–99)

## 2018-03-08 LAB — ETHANOL

## 2018-03-08 MED ORDER — FUROSEMIDE 10 MG/ML IJ SOLN
40.0000 mg | Freq: Two times a day (BID) | INTRAMUSCULAR | Status: DC
  Administered 2018-03-09: 40 mg via INTRAVENOUS
  Filled 2018-03-08: qty 4

## 2018-03-08 MED ORDER — SODIUM CHLORIDE 0.9 % IV SOLN
1.0000 g | Freq: Once | INTRAVENOUS | Status: AC
  Administered 2018-03-08: 1 g via INTRAVENOUS
  Filled 2018-03-08: qty 10

## 2018-03-08 MED ORDER — ENSURE ENLIVE PO LIQD
237.0000 mL | Freq: Two times a day (BID) | ORAL | Status: DC
  Administered 2018-03-09: 237 mL via ORAL

## 2018-03-08 MED ORDER — CARVEDILOL 3.125 MG PO TABS
3.1250 mg | ORAL_TABLET | Freq: Two times a day (BID) | ORAL | Status: DC
  Administered 2018-03-09: 3.125 mg via ORAL
  Filled 2018-03-08: qty 1

## 2018-03-08 MED ORDER — ASPIRIN 81 MG PO CHEW
324.0000 mg | CHEWABLE_TABLET | Freq: Once | ORAL | Status: AC
  Administered 2018-03-08: 324 mg via ORAL
  Filled 2018-03-08: qty 4

## 2018-03-08 MED ORDER — ATORVASTATIN CALCIUM 40 MG PO TABS
40.0000 mg | ORAL_TABLET | Freq: Every day | ORAL | Status: DC
  Administered 2018-03-08 – 2018-03-22 (×15): 40 mg via ORAL
  Filled 2018-03-08 (×14): qty 1

## 2018-03-08 MED ORDER — ONDANSETRON HCL 4 MG PO TABS: 4.0000 mg | ORAL_TABLET | Freq: Four times a day (QID) | ORAL | Status: DC | PRN

## 2018-03-08 MED ORDER — POLYETHYLENE GLYCOL 3350 17 G PO PACK
17.0000 g | PACK | Freq: Every day | ORAL | Status: DC | PRN
  Filled 2018-03-08: qty 1

## 2018-03-08 MED ORDER — SODIUM CHLORIDE 0.9 % IV BOLUS: 500.0000 mL | Freq: Once | INTRAVENOUS | Status: DC

## 2018-03-08 MED ORDER — ACETAMINOPHEN 325 MG PO TABS
650.0000 mg | ORAL_TABLET | Freq: Four times a day (QID) | ORAL | Status: DC | PRN
  Administered 2018-03-09 – 2018-03-21 (×7): 650 mg via ORAL
  Filled 2018-03-08 (×7): qty 2

## 2018-03-08 MED ORDER — ONDANSETRON HCL 4 MG/2ML IJ SOLN: 4.0000 mg | Freq: Four times a day (QID) | INTRAMUSCULAR | Status: DC | PRN

## 2018-03-08 MED ORDER — SODIUM CHLORIDE 0.9 % IV SOLN
1.0000 g | INTRAVENOUS | Status: DC
  Administered 2018-03-09 – 2018-03-10 (×2): 1 g via INTRAVENOUS
  Filled 2018-03-08 (×3): qty 10

## 2018-03-08 NOTE — ED Notes (Signed)
Critical troponin level given to Dr. Criss Alvine

## 2018-03-08 NOTE — ED Notes (Signed)
ED Provider at bedside. 

## 2018-03-08 NOTE — H&P (Addendum)
History and Physical  Tyler Gray NWG:[id number] DOB: 12/01/1932 DOA: 03/08/2018  Referring physician: Pricilla Loveless PCP: Renaye Rakers, MD  Outpatient Specialists: Dr. Verdis Prime ( Cardiology) Patient coming from:Home  Chief Complaint: weakness and fatigue   HPI: Tyler Gray is a 84 y.o. male with medical history significant for nonischemic cardiomyopathy, paroxysmal atrial fibrillation on warfarin, chronic systolic heart failure, AICD, hyperlipidemia, and chronic kidney disease stage III who presents on 03/08/2018 with reports of increased weakness by his home nurse.  History was obtained from patient and chart review. He remembers being outside today doing yard work.  He started to feel tired and decided to sit down to look around he says for about 20 minutes. He denies any palpitations, LOC, falls, lightheadedness, chest pain, fevers or chills, SOB or cough, no unintentional weight loss or weight gain, no dysuria or urinary frequency.  He reports being in his usual state of health until today but reports from his nurse is that he fell yesterday which is why his lip is bruised and swollen. Per chart review his nurse found him sitting outside for an unknown amount of time. He seemed weaker.  EMS reported patient was alert and oriented x4 but in ED he seemed confused.   Of note he reports a right sided blister from falling asleep in front of his space heater for an unknown amount of time.  Regarding his Atrial fibrillation he is followed by Dr. Verdis Prime.  He is no longer on amiodarone and on warfarin only . Has pacemaker  He was last seen by Dr. Verdis Prime on 10/2017 and instructed to stop his spironolactone and potassium for his CHF because of worsening kidney function at the time  Regarding his CHF, per chart review his wife had noticed worsening dyspnea with exertion requiring more frequent rest and worsening swelling on 12/27/17 by telephone encounter. Patient was encouraged to  adhere to low sodium diet as increased salt intake at fast food restaurants was believed to be the culprit    ED Course:  Afebrile, hemodynamically stable. UA with large leuks, > 50 WBC, few bacteria. CMP with K of 5.7 and Cr 3.45 ( baseline 2.3-2.7). BNP > 2200. Troponin slightly elevated at 0.06. Hgb 8.7 down from baseline of 10 otherwise no acute changes on CBC. INR elevated over goal at 3.69. CXR with mild bibasilar atelectasis with stable cardiomegaly and no pulmonary edema. XR of hand with no acute osseous abnormalities, and soft tissue swelling near right 5th MCP joint. CT head/Cervical spine with no acute intracranial abnormalities with cerebral volume loss and chronic small vessel ischemic changes and severe degenerative changes in cervical spine  Review of Systems:As mentioned in the history of present illness.Review of systems are otherwise negative Patient seen in the ED.   Past Medical History:  Diagnosis Date  . Acute renal failure (ARF) (HCC)   . AICD (automatic cardioverter/defibrillator) present 04/13/2017   St Jude  . AKI (acute kidney injury) (HCC) 03/08/2018  . Arthritis    "mostly hands & fingers" (03/08/2018)  . Atrial fibrillation (HCC)   . CHF (congestive heart failure) (HCC)   . Chronic systolic heart failure (HCC)   . Falls frequently    "has been falling several times lately" (03/08/2018)  . History of blood transfusion    "for his heart" (03/08/2018)  . History of gout   . History of kidney stones   . Hyperlipidemia   . Hypertension   . Pneumonia    "twice,  I think" (03/08/2018)   Past Surgical History:  Procedure Laterality Date  . BIV ICD GENERATOR CHANGEOUT N/A 04/13/2017   Procedure: BiV ICD Generator Changeout;  Surgeon: Marinus Maw, MD;  Location: [facility] INVASIVE CV LAB;  Service: Cardiovascular;  Laterality: N/A;  . CARDIAC DEFIBRILLATOR PLACEMENT  10/2009   St. Jude biventricular ICD   . FLUOROSCOPY GUIDANCE N/A 04/27/2017   Procedure: FLUOROSCOPY  GUIDANCE;  Surgeon: Duke Salvia, MD;  Location: [facility] INVASIVE CV LAB;  Service: Cardiovascular;  Laterality: N/A;  . LITHOTRIPSY    . POLYPECTOMY     pt does not recall this procedure on 03/08/2018   No Known Allergies Social History:  reports that he has never smoked. He has never used smokeless tobacco. He reports that he drank alcohol. He reports that he does not use drugs. Family History  Problem Relation Age of Onset  . Heart failure Mother   . Hypertension Mother   . Heart failure Father   . Hypertension Father   . Other Unknown        notable for CHF      Prior to Admission medications   Medication Sig Start Date End Date Taking? Authorizing Provider  acetaminophen (TYLENOL) 325 MG tablet Take 2 tablets (650 mg total) by mouth every 6 (six) hours as needed. 07/12/17  Yes Marinus Maw, MD  atorvastatin (LIPITOR) 40 MG tablet TAKE 1 TABLET EVERY DAY  AT  6PM 07/14/17  Yes Lyn Records, MD  carvedilol (COREG) 6.25 MG tablet Take 0.5 tablets (3.125 mg total) by mouth 2 (two) times daily with a meal. 01/19/18  Yes Lyn Records, MD  furosemide (LASIX) 40 MG tablet Take 0.5 tablets (20 mg total) by mouth daily. 01/17/18  Yes Lyn Records, MD  triamcinolone cream (KENALOG) 0.1 % Apply 1 application topically daily.  02/13/15  Yes [provider]  warfarin (COUMADIN) 3 MG tablet Take 1 or 1.5 tablets daily as directed by Coumadin clinic Patient taking differently: Take 3-4.5 mg by mouth See admin instructions. Take 3mg  one day and 4.5mg  the next. Alternate every day 01/19/18  Yes Lyn Records, MD    Physical Exam: BP 104/62 (BP Location: Left Arm)   Pulse 61   Temp 97.8 F (36.6 C) (Oral)   Resp 16   Ht 5\' 8"  (1.727 m)   Wt 78.4 kg (172 lb 14.4 oz) Comment: scale a  SpO2 100%   BMI 26.29 kg/m   Constitutional normal appearing male. Looks stated age Eyes: EOMI, anicteric, normal conjunctivae ENMT: Oropharynx with dry mucous membranes, normal  dentition Cardiovascular: RRR no MRGs, with 2+ pitting edema from ankles to sacrum, including groin area Respiratory: Normal respiratory effort on room air, clear breath sounds  Abdomen: Soft,non-tender, with no HSM Skin: superficial skin loss with swelling, no drainage on right lateral calf Neurologic: Grossly no focal neuro deficit. Psychiatric:Appropriate affect, and mood. Mental status AAOx3          Labs on Admission:  Basic Metabolic Panel: Recent Labs  Lab 03/08/18 1552  NA 141  K 5.7*  CL 121*  CO2 14*  GLUCOSE 111*  BUN 53*  CREATININE 3.45*  CALCIUM 8.3*   Liver Function Tests: Recent Labs  Lab 03/08/18 1552  AST 30  ALT 22  ALKPHOS 28*  BILITOT 1.7*  PROT 6.5  ALBUMIN 2.6*   No results for input(s): LIPASE, AMYLASE in the last 168 hours. No results for input(s): AMMONIA in the last 168  hours. CBC: Recent Labs  Lab 03/08/18 1552  WBC 5.4  NEUTROABS 3.7  HGB 8.7*  HCT 28.8*  MCV 106.3*  PLT 100*   Cardiac Enzymes: Recent Labs  Lab 03/08/18 1552  TROPONINI 0.06*    BNP (last 3 results) Recent Labs    03/08/18 1552  BNP 2,285.4*    ProBNP (last 3 results) Recent Labs    10/14/17 1209  PROBNP 15,115*    CBG: Recent Labs  Lab 03/08/18 1524  GLUCAP 105*    Radiological Exams on Admission: Dg Chest 2 View  Result Date: 03/08/2018 CLINICAL DATA:  Initial evaluation for acute weakness.  Fall. EXAM: CHEST - 2 VIEW COMPARISON:  Prior radiograph from 04/27/2016. FINDINGS: Left-sided transvenous pacemaker/AICD in place. Cardiomegaly, stable. Mediastinal silhouette normal. Aortic atherosclerosis. Lungs normally inflated. Mild bibasilar atelectatic changes without focal infiltrate. No pulmonary edema or pleural effusion. No pneumothorax. No acute osseous abnormality. Degenerative changes about the left shoulder. IMPRESSION: 1. Mild bibasilar atelectasis. No other active cardiopulmonary disease. 2. Stable cardiomegaly without pulmonary edema.  Electronically Signed   By: Rise Mu M.D.   On: 03/08/2018 16:49   Ct Head Wo Contrast  Result Date: 03/08/2018 CLINICAL DATA:  Altered level of consciousness. No reported injury. Left-sided lean. EXAM: CT HEAD WITHOUT CONTRAST CT CERVICAL SPINE WITHOUT CONTRAST TECHNIQUE: Multidetector CT imaging of the head and cervical spine was performed following the standard protocol without intravenous contrast. Multiplanar CT image reconstructions of the cervical spine were also generated. COMPARISON:  None. FINDINGS: CT HEAD FINDINGS Brain: No evidence of parenchymal hemorrhage or extra-axial fluid collection. No mass lesion, mass effect, or midline shift. No CT evidence of acute infarction. Generalized cerebral volume loss. Nonspecific mild subcortical and periventricular white matter hypodensity, most in keeping with chronic small vessel ischemic change. No ventriculomegaly. Vascular: No acute abnormality. Skull: No evidence of calvarial fracture. Sinuses/Orbits: The visualized paranasal sinuses are essentially clear. Other:  The mastoid air cells are unopacified. CT CERVICAL SPINE FINDINGS Alignment: Normal cervical lordosis. No facet subluxation. Dens is well positioned between the lateral masses of C1. Mild 2 mm anterolisthesis at C7-T1. Skull base and vertebrae: No acute fracture. No primary bone lesion or focal pathologic process. Soft tissues and spinal canal: No prevertebral edema. No visible canal hematoma. Disc levels: Severe multilevel degenerative disc disease throughout the cervical spine with bulky anterior marginal osteophytes. Moderate bilateral facet arthropathy. Mild degenerative foraminal stenosis on the right at C5-6. Upper chest: Partially visualized 2 lead left subclavian ICD. Thoracic aortic atherosclerosis. Other: Visualized mastoid air cells appear clear. No discrete thyroid nodules. No pathologically enlarged cervical nodes. IMPRESSION: CT HEAD: 1.  No evidence of acute  intracranial abnormality. 2. Generalized cerebral volume loss and mild chronic small vessel ischemic changes in the cerebral white matter. CT CERVICAL SPINE: 1. No cervical spine fracture or facet subluxation. 2. Severe multilevel degenerative changes in the cervical spine as detailed. Mild 2 mm anterolisthesis at C7-T1, probably degenerative. Electronically Signed   By: Delbert Phenix M.D.   On: 03/08/2018 16:42   Ct Cervical Spine Wo Contrast  Result Date: 03/08/2018 CLINICAL DATA:  Altered level of consciousness. No reported injury. Left-sided lean. EXAM: CT HEAD WITHOUT CONTRAST CT CERVICAL SPINE WITHOUT CONTRAST TECHNIQUE: Multidetector CT imaging of the head and cervical spine was performed following the standard protocol without intravenous contrast. Multiplanar CT image reconstructions of the cervical spine were also generated. COMPARISON:  None. FINDINGS: CT HEAD FINDINGS Brain: No evidence of parenchymal hemorrhage or extra-axial fluid collection.  No mass lesion, mass effect, or midline shift. No CT evidence of acute infarction. Generalized cerebral volume loss. Nonspecific mild subcortical and periventricular white matter hypodensity, most in keeping with chronic small vessel ischemic change. No ventriculomegaly. Vascular: No acute abnormality. Skull: No evidence of calvarial fracture. Sinuses/Orbits: The visualized paranasal sinuses are essentially clear. Other:  The mastoid air cells are unopacified. CT CERVICAL SPINE FINDINGS Alignment: Normal cervical lordosis. No facet subluxation. Dens is well positioned between the lateral masses of C1. Mild 2 mm anterolisthesis at C7-T1. Skull base and vertebrae: No acute fracture. No primary bone lesion or focal pathologic process. Soft tissues and spinal canal: No prevertebral edema. No visible canal hematoma. Disc levels: Severe multilevel degenerative disc disease throughout the cervical spine with bulky anterior marginal osteophytes. Moderate bilateral  facet arthropathy. Mild degenerative foraminal stenosis on the right at C5-6. Upper chest: Partially visualized 2 lead left subclavian ICD. Thoracic aortic atherosclerosis. Other: Visualized mastoid air cells appear clear. No discrete thyroid nodules. No pathologically enlarged cervical nodes. IMPRESSION: CT HEAD: 1.  No evidence of acute intracranial abnormality. 2. Generalized cerebral volume loss and mild chronic small vessel ischemic changes in the cerebral white matter. CT CERVICAL SPINE: 1. No cervical spine fracture or facet subluxation. 2. Severe multilevel degenerative changes in the cervical spine as detailed. Mild 2 mm anterolisthesis at C7-T1, probably degenerative. Electronically Signed   By: Delbert Phenix M.D.   On: 03/08/2018 16:42   Dg Hand Complete Right  Result Date: 03/08/2018 CLINICAL DATA:  Initial evaluation for acute trauma, fall. EXAM: RIGHT HAND - COMPLETE 3+ VIEW COMPARISON:  None. FINDINGS: No acute fracture or dislocation. Bones are diffusely osteopenic. Extensive arthritic changes seen throughout the right hand. Mild soft tissue swelling overlies the right fifth MCP joint. IMPRESSION: 1. No acute osseous abnormality about the right hand. 2. Extensive arthritic changes throughout the right hand with soft tissue swelling overlying the right fifth MCP joint. Electronically Signed   By: Rise Mu M.D.   On: 03/08/2018 16:58    EKG: Independently reviewed. atrial fibrillation, rate controlled.  Assessment/Plan Present on Admission: . AKI (acute kidney injury) (HCC) . Acute lower UTI . CHRONIC SYSTOLIC HEART FAILURE . Acute systolic CHF (congestive heart failure) (HCC) . Elevated INR . Hyperkalemia  Weakness, multifactorial. Could be dyspnea on exertion related to CHF flare additionally exacerbated by UTI as mentioned above. Will benefit from PT evaluation as medical problems become more stable.   AKI on CKD stage 3, suspect multifactorial etiology. Decreased  effective arterial volume due to CHF flare and decreased oral intake. Given fluids in ED, will hold on further while diuresing. Daily BMP, monitor output. If continues to worsen or no output, next step for foley and renal ultrasound    Acute lower UTI. UA consistent with infection. Continue IV ceftriaxone, f/u urine culture  Acute systolic CHF s/p ICD (congestive heart failure) (HCC). Unclear nidus for flare but elevated BNP and significant peripheral edema on exam concerning even with no lung involvement. IV lasix 40 mg BID. Daily weights, low Na diet, strict intake/output. Consult for Cardiology in Am placed. TTE placed as well. Continue home carvedilol  Atrial Fibrillation, rate controlled. Holding home coumadin due to elevated INR  Elevated troponin. Likely demand ischemia. EKG reviewed and shows no acute ischemic changes. Trend troponin q 6 H. F/u TTE.   Elevated INR. 3.6 on admission. Hold coumadin until trends back at goal of 2-3 for AF. Daily INR  Hyperkalemia, related to AKI. Unclear if  patient is still taking spironolactone though instructed to stop months ago. No T waves on EKG.  Monitor BMP in am. Expect improvement with IV lasix     DVT prophylaxis: warfarin  Code Status: Full Code, discussed on day of admission  Family Communication: No family was present at bedside  Disposition Plan: IV diuresis, IV antibiotics, TTE and Cardiology consult   Consults called: Consult to Cardiology placed for tomorrow am Admission status: Admitted as inpatient in telemetry unit.      Laverna Peace MD [facility]  Pager [phone]  If 7PM-7AM, please contact night-coverage [email or web address] Password [facility]  03/08/2018, 10:02 PM

## 2018-03-08 NOTE — ED Triage Notes (Signed)
Pt from home with ems for possible heat exhaustion and weakness. Home health nurse called EMS due to pt found outside for unknown period of time. Ems reports pt did fall yesterday, upper lip noted to be swollen. HH nurse states the pt is weaker than normal, pt usually able to ambulate without a walker but today the pt could barely take a few steps. Upon EMS arrival pt a/ox4 but upon arrival to ED pt alert but only oriented to self and place. Disoriented to time and situation. Ems also noted pt has a left sided lean but no other neuro deficits. Pt has equal grip strength, no one sided weakness or facial droop. Pt has defibrillator. Initial Bp low 90/60s given fluid and increased to 100/60. nad at this time. EDP at bedside

## 2018-03-08 NOTE — ED Notes (Signed)
Patient transported to CT 

## 2018-03-08 NOTE — ED Provider Notes (Signed)
[facility] EMERGENCY DEPARTMENT Provider Note   CSN: [id number] Arrival date & time: 03/08/18  1505   LEVEL 5 CAVEAT - MENTAL STATUS CHANGE  History   Chief Complaint Chief Complaint  Patient presents with  . Weakness  . Heat Exposure    HPI Tyler Gray is a 84 y.o. male.  HPI  84 year old male with a history of atrial fibrillation, systolic heart failure, and ICD presents with weakness.  The history is limited as the patient is confused.  EMS is no longer present when I am seeing the patient.  EMS was called for possible heat exhaustion.  The home health nurse found the patient outside sitting on a bench and he seemed weaker than normal.  The patient notes that he fell yesterday and has some swelling to his upper lip and some right-sided hand pain.  He is weaker than normal per the home health nurse and was unable to ambulate with a walker.  EMS reports the patient was alert and oriented x4 but on triage she is confused.  Blood pressure was in the low 90s and he was given 750 mL fluid.  The patient states he has a slight headache and notes that he was feeling dizzy today.  He states he walked with his rehab nurse and thinks that is what was causing him to feel dizzy and hot.  He currently feels fine.  He has a blister to his right lower extremity that he states happened when he burned it about a week ago. He endorses some mild neck pain.  Past Medical History:  Diagnosis Date  . Acute renal failure (ARF) (HCC)   . AICD (automatic cardioverter/defibrillator) present 04/13/2017   St Jude  . AKI (acute kidney injury) (HCC) 03/08/2018  . Arthritis    "mostly hands & fingers" (03/08/2018)  . Atrial fibrillation (HCC)   . CHF (congestive heart failure) (HCC)   . Chronic systolic heart failure (HCC)   . Falls frequently    "has been falling several times lately" (03/08/2018)  . History of blood transfusion    "for his heart" (03/08/2018)  . History of gout   .  History of kidney stones   . Hyperlipidemia   . Hypertension   . Pneumonia    "twice, I think" (03/08/2018)    Patient Active Problem List   Diagnosis Date Noted  . AKI (acute kidney injury) (HCC) 03/08/2018  . Acute lower UTI 03/08/2018  . Acute systolic CHF (congestive heart failure) (HCC) 03/08/2018  . Elevated INR 03/08/2018  . Hyperkalemia 03/08/2018  . Elevated troponin 03/08/2018  . Pacemaker lead failure 04/28/2017  . Encounter for therapeutic drug monitoring 12/23/2014  . Paroxysmal atrial fibrillation (HCC) 02/10/2010  . CHRONIC SYSTOLIC HEART FAILURE 02/10/2010  . Chronic kidney disease, stage IV (severe) (HCC) 02/10/2010  . Automatic implantable cardioverter-defibrillator in situ 02/10/2010    Past Surgical History:  Procedure Laterality Date  . BIV ICD GENERATOR CHANGEOUT N/A 04/13/2017   Procedure: BiV ICD Generator Changeout;  Surgeon: Marinus Maw, MD;  Location: [facility] INVASIVE CV LAB;  Service: Cardiovascular;  Laterality: N/A;  . CARDIAC DEFIBRILLATOR PLACEMENT  10/2009   St. Jude biventricular ICD   . FLUOROSCOPY GUIDANCE N/A 04/27/2017   Procedure: FLUOROSCOPY GUIDANCE;  Surgeon: Duke Salvia, MD;  Location: [facility] INVASIVE CV LAB;  Service: Cardiovascular;  Laterality: N/A;  . LITHOTRIPSY    . POLYPECTOMY     pt does not recall this procedure on 03/08/2018  Home Medications    Prior to Admission medications   Medication Sig Start Date End Date Taking? Authorizing Provider  acetaminophen (TYLENOL) 325 MG tablet Take 2 tablets (650 mg total) by mouth every 6 (six) hours as needed. 07/12/17  Yes Marinus Maw, MD  atorvastatin (LIPITOR) 40 MG tablet TAKE 1 TABLET EVERY DAY  AT  6PM 07/14/17  Yes Lyn Records, MD  carvedilol (COREG) 6.25 MG tablet Take 0.5 tablets (3.125 mg total) by mouth 2 (two) times daily with a meal. 01/19/18  Yes Lyn Records, MD  furosemide (LASIX) 40 MG tablet Take 0.5 tablets (20 mg total) by mouth daily. 01/17/18  Yes Lyn Records, MD  triamcinolone cream (KENALOG) 0.1 % Apply 1 application topically daily.  02/13/15  Yes [provider]  warfarin (COUMADIN) 3 MG tablet Take 1 or 1.5 tablets daily as directed by Coumadin clinic Patient taking differently: Take 3-4.5 mg by mouth See admin instructions. Take 3mg  one day and 4.5mg  the next. Alternate every day 01/19/18  Yes Lyn Records, MD    Family History Family History  Problem Relation Age of Onset  . Heart failure Mother   . Hypertension Mother   . Heart failure Father   . Hypertension Father   . Other Unknown        notable for CHF    Social History Social History   Tobacco Use  . Smoking status: Never Smoker  . Smokeless tobacco: Never Used  Substance Use Topics  . Alcohol use: Not Currently    Frequency: Never  . Drug use: Never     Allergies   Patient has no known allergies.   Review of Systems Review of Systems  Unable to perform ROS: Mental status change     Physical Exam Updated Vital Signs BP 104/62 (BP Location: Left Arm)   Pulse 61   Temp 97.8 F (36.6 C) (Oral)   Resp 16   Ht 5\' 8"  (1.727 m)   Wt 78.4 kg (172 lb 14.4 oz) Comment: scale a  SpO2 100%   BMI 26.29 kg/m   Physical Exam  Constitutional: He appears well-developed and well-nourished. No distress.  HENT:  Head: Normocephalic.  Right Ear: External ear normal.  Left Ear: External ear normal.  Nose: Nose normal.  Mild swelling and abrasion to right upper lip No facial tenderness  Eyes: Pupils are equal, round, and reactive to light. EOM are normal. Right eye exhibits no discharge. Left eye exhibits no discharge.  Neck: Normal range of motion. Neck supple.  Cardiovascular: Normal rate, regular rhythm and normal heart sounds.  Pulmonary/Chest: Effort normal and breath sounds normal.  Abdominal: Soft. There is no tenderness.  Musculoskeletal: He exhibits edema (BLE pitting edema to mid lower legs to feet).       Right hand: He exhibits  tenderness and swelling.       Hands: Neurological: He is alert. He is disoriented.  Alert, oriented to person and place. Disoriented to time. CN 3-12 grossly intact. 5/5 strength in all 4 extremities but is generally weak. LLE seems slightly weaker than right. Can't sit up on his own. Grossly normal sensation. Normal finger to nose.   Skin: Skin is warm and dry. He is not diaphoretic.  Large blister c/w burn to anterior right lower leg. About 1/2 of blister has ruptured previously  Nursing note and vitals reviewed.    ED Treatments / Results  Labs (all labs ordered are listed, but only  abnormal results are displayed) Labs Reviewed  COMPREHENSIVE METABOLIC PANEL - Abnormal; Notable for the following components:      Result Value   Potassium 5.7 (*)    Chloride 121 (*)    CO2 14 (*)    Glucose, Bld 111 (*)    BUN 53 (*)    Creatinine, Ser 3.45 (*)    Calcium 8.3 (*)    Albumin 2.6 (*)    Alkaline Phosphatase 28 (*)    Total Bilirubin 1.7 (*)    GFR calc non Af Amer 15 (*)    GFR calc Af Amer 17 (*)    All other components within normal limits  BRAIN NATRIURETIC PEPTIDE - Abnormal; Notable for the following components:   B Natriuretic Peptide 2,285.4 (*)    All other components within normal limits  TROPONIN I - Abnormal; Notable for the following components:   Troponin I 0.06 (*)    All other components within normal limits  CBC WITH DIFFERENTIAL/PLATELET - Abnormal; Notable for the following components:   RBC 2.71 (*)    Hemoglobin 8.7 (*)    HCT 28.8 (*)    MCV 106.3 (*)    Platelets 100 (*)    Lymphs Abs 0.6 (*)    All other components within normal limits  PROTIME-INR - Abnormal; Notable for the following components:   Prothrombin Time 36.3 (*)    All other components within normal limits  URINALYSIS, ROUTINE W REFLEX MICROSCOPIC - Abnormal; Notable for the following components:   APPearance CLOUDY (*)    Hgb urine dipstick MODERATE (*)    Protein, ur 100 (*)     Leukocytes, UA LARGE (*)    WBC, UA >50 (*)    Bacteria, UA FEW (*)    All other components within normal limits  CBG MONITORING, ED - Abnormal; Notable for the following components:   Glucose-Capillary 105 (*)    All other components within normal limits  URINE CULTURE  ETHANOL  PROTIME-INR  CBC  BASIC METABOLIC PANEL  TROPONIN I  TROPONIN I  POC OCCULT BLOOD, ED    EKG EKG Interpretation  Date/Time:  Wednesday March 08 2018 15:15:44 EDT Ventricular Rate:  64 PR Interval:    QRS Duration: 146 QT Interval:  485 QTC Calculation: 501 R Axis:   -59 Text Interpretation:  Atrial fibrillation Ventricular premature complex RBBB and LAFB Abnrm T, consider ischemia, anterolateral lds Baseline wander in lead(s) I II aVR V1 tachycardia no longer present compared to 2011 RBBB/LAFB new since 2011 Confirmed by Pricilla Loveless [phone]) on 03/08/2018 3:20:27 PM   Radiology Dg Chest 2 View  Result Date: 03/08/2018 CLINICAL DATA:  Initial evaluation for acute weakness.  Fall. EXAM: CHEST - 2 VIEW COMPARISON:  Prior radiograph from 04/27/2016. FINDINGS: Left-sided transvenous pacemaker/AICD in place. Cardiomegaly, stable. Mediastinal silhouette normal. Aortic atherosclerosis. Lungs normally inflated. Mild bibasilar atelectatic changes without focal infiltrate. No pulmonary edema or pleural effusion. No pneumothorax. No acute osseous abnormality. Degenerative changes about the left shoulder. IMPRESSION: 1. Mild bibasilar atelectasis. No other active cardiopulmonary disease. 2. Stable cardiomegaly without pulmonary edema. Electronically Signed   By: Rise Mu M.D.   On: 03/08/2018 16:49   Ct Head Wo Contrast  Result Date: 03/08/2018 CLINICAL DATA:  Altered level of consciousness. No reported injury. Left-sided lean. EXAM: CT HEAD WITHOUT CONTRAST CT CERVICAL SPINE WITHOUT CONTRAST TECHNIQUE: Multidetector CT imaging of the head and cervical spine was performed following the standard protocol  without intravenous contrast. Multiplanar  CT image reconstructions of the cervical spine were also generated. COMPARISON:  None. FINDINGS: CT HEAD FINDINGS Brain: No evidence of parenchymal hemorrhage or extra-axial fluid collection. No mass lesion, mass effect, or midline shift. No CT evidence of acute infarction. Generalized cerebral volume loss. Nonspecific mild subcortical and periventricular white matter hypodensity, most in keeping with chronic small vessel ischemic change. No ventriculomegaly. Vascular: No acute abnormality. Skull: No evidence of calvarial fracture. Sinuses/Orbits: The visualized paranasal sinuses are essentially clear. Other:  The mastoid air cells are unopacified. CT CERVICAL SPINE FINDINGS Alignment: Normal cervical lordosis. No facet subluxation. Dens is well positioned between the lateral masses of C1. Mild 2 mm anterolisthesis at C7-T1. Skull base and vertebrae: No acute fracture. No primary bone lesion or focal pathologic process. Soft tissues and spinal canal: No prevertebral edema. No visible canal hematoma. Disc levels: Severe multilevel degenerative disc disease throughout the cervical spine with bulky anterior marginal osteophytes. Moderate bilateral facet arthropathy. Mild degenerative foraminal stenosis on the right at C5-6. Upper chest: Partially visualized 2 lead left subclavian ICD. Thoracic aortic atherosclerosis. Other: Visualized mastoid air cells appear clear. No discrete thyroid nodules. No pathologically enlarged cervical nodes. IMPRESSION: CT HEAD: 1.  No evidence of acute intracranial abnormality. 2. Generalized cerebral volume loss and mild chronic small vessel ischemic changes in the cerebral white matter. CT CERVICAL SPINE: 1. No cervical spine fracture or facet subluxation. 2. Severe multilevel degenerative changes in the cervical spine as detailed. Mild 2 mm anterolisthesis at C7-T1, probably degenerative. Electronically Signed   By: Delbert Phenix M.D.   On:  03/08/2018 16:42   Ct Cervical Spine Wo Contrast  Result Date: 03/08/2018 CLINICAL DATA:  Altered level of consciousness. No reported injury. Left-sided lean. EXAM: CT HEAD WITHOUT CONTRAST CT CERVICAL SPINE WITHOUT CONTRAST TECHNIQUE: Multidetector CT imaging of the head and cervical spine was performed following the standard protocol without intravenous contrast. Multiplanar CT image reconstructions of the cervical spine were also generated. COMPARISON:  None. FINDINGS: CT HEAD FINDINGS Brain: No evidence of parenchymal hemorrhage or extra-axial fluid collection. No mass lesion, mass effect, or midline shift. No CT evidence of acute infarction. Generalized cerebral volume loss. Nonspecific mild subcortical and periventricular white matter hypodensity, most in keeping with chronic small vessel ischemic change. No ventriculomegaly. Vascular: No acute abnormality. Skull: No evidence of calvarial fracture. Sinuses/Orbits: The visualized paranasal sinuses are essentially clear. Other:  The mastoid air cells are unopacified. CT CERVICAL SPINE FINDINGS Alignment: Normal cervical lordosis. No facet subluxation. Dens is well positioned between the lateral masses of C1. Mild 2 mm anterolisthesis at C7-T1. Skull base and vertebrae: No acute fracture. No primary bone lesion or focal pathologic process. Soft tissues and spinal canal: No prevertebral edema. No visible canal hematoma. Disc levels: Severe multilevel degenerative disc disease throughout the cervical spine with bulky anterior marginal osteophytes. Moderate bilateral facet arthropathy. Mild degenerative foraminal stenosis on the right at C5-6. Upper chest: Partially visualized 2 lead left subclavian ICD. Thoracic aortic atherosclerosis. Other: Visualized mastoid air cells appear clear. No discrete thyroid nodules. No pathologically enlarged cervical nodes. IMPRESSION: CT HEAD: 1.  No evidence of acute intracranial abnormality. 2. Generalized cerebral volume loss  and mild chronic small vessel ischemic changes in the cerebral white matter. CT CERVICAL SPINE: 1. No cervical spine fracture or facet subluxation. 2. Severe multilevel degenerative changes in the cervical spine as detailed. Mild 2 mm anterolisthesis at C7-T1, probably degenerative. Electronically Signed   By: Delbert Phenix M.D.   On: 03/08/2018  16:42   Dg Hand Complete Right  Result Date: 03/08/2018 CLINICAL DATA:  Initial evaluation for acute trauma, fall. EXAM: RIGHT HAND - COMPLETE 3+ VIEW COMPARISON:  None. FINDINGS: No acute fracture or dislocation. Bones are diffusely osteopenic. Extensive arthritic changes seen throughout the right hand. Mild soft tissue swelling overlies the right fifth MCP joint. IMPRESSION: 1. No acute osseous abnormality about the right hand. 2. Extensive arthritic changes throughout the right hand with soft tissue swelling overlying the right fifth MCP joint. Electronically Signed   By: Rise Mu M.D.   On: 03/08/2018 16:58    Procedures Procedures (including critical care time)  Medications Ordered in ED Medications  acetaminophen (TYLENOL) tablet 650 mg (has no administration in time range)  atorvastatin (LIPITOR) tablet 40 mg (40 mg Oral Given 03/08/18 2231)  carvedilol (COREG) tablet 3.125 mg (has no administration in time range)  polyethylene glycol (MIRALAX / GLYCOLAX) packet 17 g (has no administration in time range)  ondansetron (ZOFRAN) tablet 4 mg (has no administration in time range)    Or  ondansetron (ZOFRAN) injection 4 mg (has no administration in time range)  furosemide (LASIX) injection 40 mg (has no administration in time range)  feeding supplement (ENSURE ENLIVE) (ENSURE ENLIVE) liquid 237 mL (has no administration in time range)  cefTRIAXone (ROCEPHIN) 1 g in sodium chloride 0.9 % 100 mL IVPB (has no administration in time range)  aspirin chewable tablet 324 mg (324 mg Oral Given 03/08/18 1737)  cefTRIAXone (ROCEPHIN) 1 g in sodium  chloride 0.9 % 100 mL IVPB ( Intravenous Stopped 03/08/18 1807)     Initial Impression / Assessment and Plan / ED Course  I have reviewed the triage vital signs and the nursing notes.  Pertinent labs & imaging results that were available during my care of the patient were reviewed by me and considered in my medical decision making (see chart for details).     Patient is complicated.  He has generalized weakness and seems to be a little more confused.  This is probably from an acute urinary tract infection.  He has acute on chronic kidney injury.  He also has significant elevated BNP with at least peripheral edema.  He does not appear to have obvious pulmonary edema but is unclear if his renal function is due to worsening CHF or dehydration.  He was initially hypotensive for EMS but currently is normotensive.  I discussed with hospitalist who will come admit.  He was given IV Rocephin.  Final Clinical Impressions(s) / ED Diagnoses   Final diagnoses:  Acute UTI  Acute kidney injury superimposed on chronic kidney disease [facility])    ED Discharge Orders    None       Pricilla Loveless, MD 03/08/18 2259

## 2018-03-09 ENCOUNTER — Other Ambulatory Visit (HOSPITAL_COMMUNITY): Payer: Medicare PPO

## 2018-03-09 ENCOUNTER — Inpatient Hospital Stay (HOSPITAL_COMMUNITY): Payer: Medicare PPO

## 2018-03-09 ENCOUNTER — Other Ambulatory Visit: Payer: Self-pay

## 2018-03-09 DIAGNOSIS — I5023 Acute on chronic systolic (congestive) heart failure: Secondary | ICD-10-CM

## 2018-03-09 DIAGNOSIS — L8990 Pressure ulcer of unspecified site, unspecified stage: Secondary | ICD-10-CM | POA: Diagnosis present

## 2018-03-09 DIAGNOSIS — E440 Moderate protein-calorie malnutrition: Secondary | ICD-10-CM | POA: Diagnosis present

## 2018-03-09 DIAGNOSIS — I4891 Unspecified atrial fibrillation: Secondary | ICD-10-CM

## 2018-03-09 DIAGNOSIS — N183 Chronic kidney disease, stage 3 (moderate): Secondary | ICD-10-CM

## 2018-03-09 DIAGNOSIS — I428 Other cardiomyopathies: Secondary | ICD-10-CM

## 2018-03-09 DIAGNOSIS — I351 Nonrheumatic aortic (valve) insufficiency: Secondary | ICD-10-CM

## 2018-03-09 LAB — BASIC METABOLIC PANEL
Anion gap: 9 (ref 5–15)
Anion gap: 6 (ref 5–15)
BUN: 54 mg/dL — ABNORMAL HIGH (ref 8–23)
BUN: 55 mg/dL — ABNORMAL HIGH (ref 8–23)
CALCIUM: 8.3 mg/dL — AB (ref 8.9–10.3)
CHLORIDE: 122 mmol/L — AB (ref 98–111)
CHLORIDE: 121 mmol/L — AB (ref 98–111)
CO2: 13 mmol/L — AB (ref 22–32)
CO2: 16 mmol/L — AB (ref 22–32)
CREATININE: 3.48 mg/dL — AB (ref 0.61–1.24)
Calcium: 8.3 mg/dL — ABNORMAL LOW (ref 8.9–10.3)
Creatinine, Ser: 3.52 mg/dL — ABNORMAL HIGH (ref 0.61–1.24)
GFR calc Af Amer: 17 mL/min — ABNORMAL LOW (ref >60)
GFR calc non Af Amer: 15 mL/min — ABNORMAL LOW (ref >60)
GFR calc non Af Amer: 15 mL/min — ABNORMAL LOW (ref >60)
GFR, EST AFRICAN AMERICAN: 17 mL/min — AB (ref >60)
Glucose, Bld: 98 mg/dL (ref 70–99)
Glucose, Bld: 124 mg/dL — ABNORMAL HIGH (ref 70–99)
Potassium: 5.8 mmol/L — ABNORMAL HIGH (ref 3.5–5.1)
Potassium: 5.4 mmol/L — ABNORMAL HIGH (ref 3.5–5.1)
SODIUM: 143 mmol/L (ref 135–145)
Sodium: 144 mmol/L (ref 135–145)

## 2018-03-09 LAB — CBC
HEMATOCRIT: 29.2 % — AB (ref 39.0–52.0)
HEMOGLOBIN: 8.7 g/dL — AB (ref 13.0–17.0)
MCH: 32.3 pg (ref 26.0–34.0)
MCHC: 29.8 g/dL — AB (ref 30.0–36.0)
MCV: 108.6 fL — ABNORMAL HIGH (ref 78.0–100.0)
Platelets: 95 10*3/uL — ABNORMAL LOW (ref 150–400)
RBC: 2.69 MIL/uL — ABNORMAL LOW (ref 4.22–5.81)
RDW: 15.7 % — ABNORMAL HIGH (ref 11.5–15.5)
WBC: 5.4 10*3/uL (ref 4.0–10.5)

## 2018-03-09 LAB — URINE CULTURE

## 2018-03-09 LAB — TROPONIN I
TROPONIN I: 0.05 ng/mL — AB (ref <0.03)
Troponin I: 0.06 ng/mL (ref <0.03)

## 2018-03-09 LAB — PROTIME-INR
INR: 3.83
Prothrombin Time: 37.4 seconds — ABNORMAL HIGH (ref 11.4–15.2)

## 2018-03-09 LAB — ECHOCARDIOGRAM COMPLETE
HEIGHTINCHES: 68 in
Weight: 2766.4 oz

## 2018-03-09 MED ORDER — FUROSEMIDE 10 MG/ML IJ SOLN
80.0000 mg | Freq: Two times a day (BID) | INTRAMUSCULAR | Status: DC
  Administered 2018-03-09 – 2018-03-15 (×12): 80 mg via INTRAVENOUS
  Filled 2018-03-09 (×13): qty 8

## 2018-03-09 MED ORDER — ADULT MULTIVITAMIN W/MINERALS CH
1.0000 | ORAL_TABLET | Freq: Every day | ORAL | Status: DC
  Administered 2018-03-10 – 2018-03-22 (×13): 1 via ORAL
  Filled 2018-03-09 (×14): qty 1

## 2018-03-09 MED ORDER — PATIROMER SORBITEX CALCIUM 8.4 G PO PACK
8.4000 g | PACK | Freq: Once | ORAL | Status: AC
  Administered 2018-03-09: 8.4 g via ORAL
  Filled 2018-03-09: qty 1

## 2018-03-09 MED ORDER — ENSURE ENLIVE PO LIQD
237.0000 mL | Freq: Two times a day (BID) | ORAL | Status: DC
  Administered 2018-03-10 – 2018-03-14 (×7): 237 mL via ORAL

## 2018-03-09 NOTE — Progress Notes (Signed)
Initial Nutrition Assessment  DOCUMENTATION CODES:   Non-severe (moderate) malnutrition in context of chronic illness  INTERVENTION:   -Continue Ensure Enlive po BID, each supplement provides 350 kcal and 20 grams of protein -MVI with minerals daily  NUTRITION DIAGNOSIS:   Moderate Malnutrition related to chronic illness(CHF) as evidenced by energy intake < 75% for > or equal to 1 month, mild fat depletion, mild muscle depletion, moderate muscle depletion.  GOAL:   Patient will meet greater than or equal to 90% of their needs  MONITOR:   PO intake, Supplement acceptance, Labs, Weight trends, Skin, I & O's  REASON FOR ASSESSMENT:   Malnutrition Screening Tool    ASSESSMENT:   Tyler Gray is a 84 y.o. male with medical history significant for nonischemic cardiomyopathy, paroxysmal atrial fibrillation on warfarin, chronic systolic heart failure, AICD, hyperlipidemia, and chronic kidney disease stage III  Pt admitted with AKI and multi-factorial weakness.   Spoke with pt at bedside, who reports a decreased appetite over the past 1-2 weeks. He reports that he noticed a general decline in health when his wife went out of town for a few weeks and was relying on family members for food preparation. Per pt, he was consuming 2-3 meals per day, however, intake decreased as the day progressed (Breakfast: grits, toast and jelly, scrambled egg, fruit juice, Lunch and Dinner: fruit and yogurt); he also shares he would either skip or only eat a small amount of lunch (a few bites of fruit) due to lack of appetite. Pt reports he has been eating a lot of fruit and yogurt lately, as that has been very appealing to him. His appetite has improved since admission; he consumed almost all of his breakfast. Meal completion 75%. Pt also consumed 100% of Ensure supplement at bedside.   Pt denies any weight loss. He is unsure of his UBW. Reviewed wt hx, which confirms this statement, however, this RD  suspects edema may be masking true wt loss.   Discussed with pt importance of good meal and supplement intake to promote healing. Pt is amenable to continue Ensure supplements.  Labs reviewed: K: 5.8  NUTRITION - FOCUSED PHYSICAL EXAM:    Most Recent Value  Orbital Region  Mild depletion  Upper Arm Region  Mild depletion  Thoracic and Lumbar Region  No depletion  Buccal Region  Mild depletion  Temple Region  Moderate depletion  Clavicle Bone Region  No depletion  Clavicle and Acromion Bone Region  Mild depletion  Scapular Bone Region  Moderate depletion  Dorsal Hand  Moderate depletion  Patellar Region  Mild depletion  Anterior Thigh Region  Mild depletion  Posterior Calf Region  Mild depletion  Edema (RD Assessment)  Moderate  Hair  Reviewed  Eyes  Reviewed  Mouth  Reviewed  Skin  Reviewed  Nails  Reviewed       Diet Order:   Diet Order           Diet 2 gram sodium Room service appropriate? Yes; Fluid consistency: Thin  Diet effective now          EDUCATION NEEDS:   Education needs have been addressed  Skin:  Skin Assessment: Skin Integrity Issues: Skin Integrity Issues:: Other (Comment) Other: burm to rt lateral leg from space heater  Last BM:  03/08/18  Height:   Ht Readings from Last 1 Encounters:  03/08/18 5\' 8"  (1.727 m)    Weight:   Wt Readings from Last 1 Encounters:  03/09/18 172  lb 14.4 oz (78.4 kg)    Ideal Body Weight:  70 kg  BMI:  Body mass index is 26.29 kg/m.  Estimated Nutritional Needs:   Kcal:  1750-1950  Protein:  85-100 grams  Fluid:  1.7-1.9 L    Kayti Poss A. Mayford Knife, RD, LDN, CDE Pager: [phone] After hours Pager: [phone]

## 2018-03-09 NOTE — Progress Notes (Signed)
Interim progress note. Notified of poor UOP despite lasix today. He has made 200cc urine thus far. Renal U/S revealed a right kidney that is thinned with moderate hydronephrosis and increased echogenicity. No right ureteral jet was seen. Left kidney is 14.7cm with cortical thinning and echogenicity.   Discussed with Dr. Vernie Ammons who has accessed records from [facility] including CT abd from 2013. His urological history is right ureterolithiasis treated with lithotripsy with subsequent radiographic proof of clearing. He subsequently had recurrent right ureteral stones and declined treatment in 2013. Therefore, it is assumed that only the left kidney is functional and, as above, has evidence of chronic medical renal disease.   - Overall poor UOP is obviously not explained by unilateral findings. Check PVR and place foley if retaining - Suspect we will need nephrology input. Fortunately hyperkalemia and acidosis are improving today and he has no significant pulmonary edema.  - BMP in AM - Will get CT stone protocol to confirm, though no interventions are planned per urology at this time.   Hazeline Junker, MD 03/09/2018 7:16 PM

## 2018-03-09 NOTE — Progress Notes (Signed)
PROGRESS NOTE  Tyler Gray  ZOX:[id number] DOB: April 16, 1933 DOA: 03/08/2018 PCP: Renaye Rakers, MD  Outpatient Specialists: Cardiology, Dr. Verdis Prime Brief Narrative: Tyler Gray is an 84 y.o. male with a history of NICM, chronic systolic CHF s/p BiV pacemaker, PAF on coumadin, stage III CKD who presented with increasing weakness, lower extremity edema and shortness of breath. Pt is difficult historian but appeared volume overloaded. Afebrile, hemodynamically stable. UA with large leuks, > 50 WBC, few bacteria. CMP with K of 5.7 and Cr 3.45 ( baseline 2.3-2.7). BNP > 2200. Troponin slightly elevated at 0.06. Hgb 8.7 down from baseline of 10 otherwise no acute changes on CBC. INR elevated over goal at 3.69. CXR with mild bibasilar atelectasis with stable cardiomegaly and no pulmonary edema. XR of hand with no acute osseous abnormalities, and soft tissue swelling near right 5th MCP joint. CT head/Cervical spine with no acute intracranial abnormalities with cerebral volume loss and chronic small vessel ischemic changes and severe degenerative changes in cervical spine  Lasix was started and cardiology consulted. Lasix has been increased and beta blocker stopped by cardiology.   Assessment & Plan: Active Problems:   Paroxysmal atrial fibrillation (HCC)   CHRONIC SYSTOLIC HEART FAILURE   Chronic kidney disease, stage IV (severe) (HCC)   Automatic implantable cardioverter-defibrillator in situ   AKI (acute kidney injury) (HCC)   Acute lower UTI   Acute systolic CHF (congestive heart failure) (HCC)   Elevated INR   Hyperkalemia   Elevated troponin  Generalized weakness: Multifactorial due to heart failure, possibly also UTI and deconditioning.  - Treat as below - Will get PT/OT evaluations. Uses walker at home.   AKI on stage III CKD: Decreased perfusion from depleted EABV.  - Monitor closely with diuresis. Agree with holding antihypertensives to minimize hemodynamically-mediated injury.    - Hyperkalemic renal failure, will check renal U/S    Hyperkalemia:  - Patiromer, lasix, recheck BMP this PM - Continue telemetry, no ECG changes.   Acute on chronic systolic CHF: s/p ICD.  - lasix 80mg  IV BID - Monitor strict I/O, daily weights, BMP - Sodium restriction - Update echocardiogram performed, read pending. - No ACE/ARB/entresto with renal insufficiency/hyperK. Holding beta blocker with need for diuresis. May need milrinone.   Acute lower UTI: UA consistent with infection. - Ceftriaxone - Monitor urine culture.  Atrial Fibrillation, rate controlled:  - Continue telemetry - Holding home coumadin due to elevated INR  Elevated troponin. Likely demand ischemia. EKG reviewed and shows no acute ischemic changes. Trend troponin q 6 H. F/u TTE.   Supratherapeutic INR: 3.6 on admission.  - Hold coumadin - Daily INR - No indication for reversal.  DVT prophylaxis: Coumadin Code Status: Full Family Communication: None at bedside Disposition Plan: Uncertain  Consultants:   Cardiology, Dr. Jens Som  Procedures:   Echocardiogram 03/09/2018  Antimicrobials:  Ceftriaxone 6/26 >>    Subjective: Feels a little better than admission. Has not been urinating much overnight but did not receive lasix until early this morning. Denies dyspnea.   Objective: Vitals:   03/09/18 0041 03/09/18 0045 03/09/18 0500 03/09/18 0638  BP: 104/65 (!) 93/50  92/61  Pulse: 64 69  66  Resp:    16  Temp:    98.3 F (36.8 C)  TempSrc:    Oral  SpO2: 100% 100%  99%  Weight:   78.4 kg (172 lb 14.4 oz)   Height:        Intake/Output Summary (Last 24 hours) at  03/09/2018 1242 Last data filed at 03/09/2018 1610 Gross per 24 hour  Intake 1690 ml  Output 500 ml  Net 1190 ml   Filed Weights   03/08/18 1529 03/08/18 2019 03/09/18 0500  Weight: 73 kg (161 lb) 78.4 kg (172 lb 14.4 oz) 78.4 kg (172 lb 14.4 oz)    Gen: Frail elderly pleasant male in no distress on BSC Pulm: Non-labored  breathing. Diminished at bases. CV: Regular rate and rhythm. No murmur, rub, or gallop. No JVD, 1+ pitting LE edema to above the knees GI: Abdomen soft, non-tender, non-distended, with normoactive bowel sounds. No organomegaly or masses felt. Ext: Warm, no deformities Skin: Right shin with dressing with serous discharge, no purulence or odor.  Neuro: Alert and oriented. No focal neurological deficits. Psych: Judgement and insight appear fair. Mood & affect appropriate.   Data Reviewed: I have personally reviewed following labs and imaging studies  CBC: Recent Labs  Lab 03/08/18 1552 03/09/18 0554  WBC 5.4 5.4  NEUTROABS 3.7  --   HGB 8.7* 8.7*  HCT 28.8* 29.2*  MCV 106.3* 108.6*  PLT 100* 95*   Basic Metabolic Panel: Recent Labs  Lab 03/08/18 1552 03/09/18 0554  NA 141 144  K 5.7* 5.8*  CL 121* 122*  CO2 14* 13*  GLUCOSE 111* 98  BUN 53* 54*  CREATININE 3.45* 3.48*  CALCIUM 8.3* 8.3*   GFR: Estimated Creatinine Clearance: 15.6 mL/min (A) (by C-G formula based on SCr of 3.48 mg/dL (H)). Liver Function Tests: Recent Labs  Lab 03/08/18 1552  AST 30  ALT 22  ALKPHOS 28*  BILITOT 1.7*  PROT 6.5  ALBUMIN 2.6*   No results for input(s): LIPASE, AMYLASE in the last 168 hours. No results for input(s): AMMONIA in the last 168 hours. Coagulation Profile: Recent Labs  Lab 03/08/18 1552 03/09/18 0554  INR 3.69 3.83   Cardiac Enzymes: Recent Labs  Lab 03/08/18 1552 03/08/18 2350 03/09/18 0554  TROPONINI 0.06* 0.06* 0.05*   BNP (last 3 results) Recent Labs    10/14/17 1209  PROBNP 15,115*   HbA1C: No results for input(s): HGBA1C in the last 72 hours. CBG: Recent Labs  Lab 03/08/18 1524  GLUCAP 105*   Lipid Profile: No results for input(s): CHOL, HDL, LDLCALC, TRIG, CHOLHDL, LDLDIRECT in the last 72 hours. Thyroid Function Tests: No results for input(s): TSH, T4TOTAL, FREET4, T3FREE, THYROIDAB in the last 72 hours. Anemia Panel: No results for  input(s): VITAMINB12, FOLATE, FERRITIN, TIBC, IRON, RETICCTPCT in the last 72 hours. Urine analysis:    Component Value Date/Time   COLORURINE YELLOW 03/08/2018 1628   APPEARANCEUR CLOUDY (A) 03/08/2018 1628   LABSPEC 1.012 03/08/2018 1628   PHURINE 5.0 03/08/2018 1628   GLUCOSEU NEGATIVE 03/08/2018 1628   HGBUR MODERATE (A) 03/08/2018 1628   BILIRUBINUR NEGATIVE 03/08/2018 1628   KETONESUR NEGATIVE 03/08/2018 1628   PROTEINUR 100 (A) 03/08/2018 1628   NITRITE NEGATIVE 03/08/2018 1628   LEUKOCYTESUR LARGE (A) 03/08/2018 1628   No results found for this or any previous visit (from the past 240 hour(s)).    Radiology Studies: Dg Chest 2 View  Result Date: 03/08/2018 CLINICAL DATA:  Initial evaluation for acute weakness.  Fall. EXAM: CHEST - 2 VIEW COMPARISON:  Prior radiograph from 04/27/2016. FINDINGS: Left-sided transvenous pacemaker/AICD in place. Cardiomegaly, stable. Mediastinal silhouette normal. Aortic atherosclerosis. Lungs normally inflated. Mild bibasilar atelectatic changes without focal infiltrate. No pulmonary edema or pleural effusion. No pneumothorax. No acute osseous abnormality. Degenerative changes about the left  shoulder. IMPRESSION: 1. Mild bibasilar atelectasis. No other active cardiopulmonary disease. 2. Stable cardiomegaly without pulmonary edema. Electronically Signed   By: Rise Mu M.D.   On: 03/08/2018 16:49   Ct Head Wo Contrast  Result Date: 03/08/2018 CLINICAL DATA:  Altered level of consciousness. No reported injury. Left-sided lean. EXAM: CT HEAD WITHOUT CONTRAST CT CERVICAL SPINE WITHOUT CONTRAST TECHNIQUE: Multidetector CT imaging of the head and cervical spine was performed following the standard protocol without intravenous contrast. Multiplanar CT image reconstructions of the cervical spine were also generated. COMPARISON:  None. FINDINGS: CT HEAD FINDINGS Brain: No evidence of parenchymal hemorrhage or extra-axial fluid collection. No mass  lesion, mass effect, or midline shift. No CT evidence of acute infarction. Generalized cerebral volume loss. Nonspecific mild subcortical and periventricular white matter hypodensity, most in keeping with chronic small vessel ischemic change. No ventriculomegaly. Vascular: No acute abnormality. Skull: No evidence of calvarial fracture. Sinuses/Orbits: The visualized paranasal sinuses are essentially clear. Other:  The mastoid air cells are unopacified. CT CERVICAL SPINE FINDINGS Alignment: Normal cervical lordosis. No facet subluxation. Dens is well positioned between the lateral masses of C1. Mild 2 mm anterolisthesis at C7-T1. Skull base and vertebrae: No acute fracture. No primary bone lesion or focal pathologic process. Soft tissues and spinal canal: No prevertebral edema. No visible canal hematoma. Disc levels: Severe multilevel degenerative disc disease throughout the cervical spine with bulky anterior marginal osteophytes. Moderate bilateral facet arthropathy. Mild degenerative foraminal stenosis on the right at C5-6. Upper chest: Partially visualized 2 lead left subclavian ICD. Thoracic aortic atherosclerosis. Other: Visualized mastoid air cells appear clear. No discrete thyroid nodules. No pathologically enlarged cervical nodes. IMPRESSION: CT HEAD: 1.  No evidence of acute intracranial abnormality. 2. Generalized cerebral volume loss and mild chronic small vessel ischemic changes in the cerebral white matter. CT CERVICAL SPINE: 1. No cervical spine fracture or facet subluxation. 2. Severe multilevel degenerative changes in the cervical spine as detailed. Mild 2 mm anterolisthesis at C7-T1, probably degenerative. Electronically Signed   By: Delbert Phenix M.D.   On: 03/08/2018 16:42   Ct Cervical Spine Wo Contrast  Result Date: 03/08/2018 CLINICAL DATA:  Altered level of consciousness. No reported injury. Left-sided lean. EXAM: CT HEAD WITHOUT CONTRAST CT CERVICAL SPINE WITHOUT CONTRAST TECHNIQUE:  Multidetector CT imaging of the head and cervical spine was performed following the standard protocol without intravenous contrast. Multiplanar CT image reconstructions of the cervical spine were also generated. COMPARISON:  None. FINDINGS: CT HEAD FINDINGS Brain: No evidence of parenchymal hemorrhage or extra-axial fluid collection. No mass lesion, mass effect, or midline shift. No CT evidence of acute infarction. Generalized cerebral volume loss. Nonspecific mild subcortical and periventricular white matter hypodensity, most in keeping with chronic small vessel ischemic change. No ventriculomegaly. Vascular: No acute abnormality. Skull: No evidence of calvarial fracture. Sinuses/Orbits: The visualized paranasal sinuses are essentially clear. Other:  The mastoid air cells are unopacified. CT CERVICAL SPINE FINDINGS Alignment: Normal cervical lordosis. No facet subluxation. Dens is well positioned between the lateral masses of C1. Mild 2 mm anterolisthesis at C7-T1. Skull base and vertebrae: No acute fracture. No primary bone lesion or focal pathologic process. Soft tissues and spinal canal: No prevertebral edema. No visible canal hematoma. Disc levels: Severe multilevel degenerative disc disease throughout the cervical spine with bulky anterior marginal osteophytes. Moderate bilateral facet arthropathy. Mild degenerative foraminal stenosis on the right at C5-6. Upper chest: Partially visualized 2 lead left subclavian ICD. Thoracic aortic atherosclerosis. Other: Visualized mastoid air  cells appear clear. No discrete thyroid nodules. No pathologically enlarged cervical nodes. IMPRESSION: CT HEAD: 1.  No evidence of acute intracranial abnormality. 2. Generalized cerebral volume loss and mild chronic small vessel ischemic changes in the cerebral white matter. CT CERVICAL SPINE: 1. No cervical spine fracture or facet subluxation. 2. Severe multilevel degenerative changes in the cervical spine as detailed. Mild 2 mm  anterolisthesis at C7-T1, probably degenerative. Electronically Signed   By: Delbert Phenix M.D.   On: 03/08/2018 16:42   Dg Hand Complete Right  Result Date: 03/08/2018 CLINICAL DATA:  Initial evaluation for acute trauma, fall. EXAM: RIGHT HAND - COMPLETE 3+ VIEW COMPARISON:  None. FINDINGS: No acute fracture or dislocation. Bones are diffusely osteopenic. Extensive arthritic changes seen throughout the right hand. Mild soft tissue swelling overlies the right fifth MCP joint. IMPRESSION: 1. No acute osseous abnormality about the right hand. 2. Extensive arthritic changes throughout the right hand with soft tissue swelling overlying the right fifth MCP joint. Electronically Signed   By: Rise Mu M.D.   On: 03/08/2018 16:58    Scheduled Meds: . atorvastatin  40 mg Oral q1800  . feeding supplement (ENSURE ENLIVE)  237 mL Oral BID BM  . furosemide  80 mg Intravenous BID  . patiromer  8.4 g Oral Once   Continuous Infusions: . cefTRIAXone (ROCEPHIN)  IV       LOS: 1 day   Time spent: 25 minutes.  Tyrone Nine, MD [facility] [email or web address] Password [facility] 03/09/2018, 12:42 PM
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
## 2018-03-09 NOTE — Progress Notes (Signed)
PT Cancellation Note  Patient Details Name: Tyler Gray MRN: [id number] DOB: 1933/04/18   Cancelled Treatment:    Reason Eval/Treat Not Completed: Patient at procedure or test/unavailable  Etta Grandchild, PT, DPT Acute Rehab Services Pager: [phone]    Etta Grandchild 03/09/2018, 5:05 PM

## 2018-03-09 NOTE — Plan of Care (Signed)
  Problem: Cardiac: Goal: Ability to achieve and maintain adequate cardiopulmonary perfusion will improve Outcome: Completed/Met

## 2018-03-09 NOTE — Progress Notes (Signed)
  Echocardiogram 2D Echocardiogram has been performed.  Tyler Gray 03/09/2018, 10:13 AM

## 2018-03-09 NOTE — Progress Notes (Signed)
Paged Dr Jarvis Newcomer about concerns of decreased output, Renal US was completed  Per MD plan to perform post residual bladder scans and to place foley indwelling Foley if bladder scan is greater than 300cc. Urologist to to be consulted.

## 2018-03-10 ENCOUNTER — Other Ambulatory Visit (HOSPITAL_COMMUNITY): Payer: Medicare PPO

## 2018-03-10 ENCOUNTER — Inpatient Hospital Stay (HOSPITAL_COMMUNITY): Payer: Medicare PPO

## 2018-03-10 DIAGNOSIS — I5021 Acute systolic (congestive) heart failure: Secondary | ICD-10-CM

## 2018-03-10 LAB — BASIC METABOLIC PANEL
Anion gap: 8 (ref 5–15)
BUN: 58 mg/dL — AB (ref 8–23)
CALCIUM: 8.2 mg/dL — AB (ref 8.9–10.3)
CO2: 16 mmol/L — ABNORMAL LOW (ref 22–32)
CREATININE: 3.5 mg/dL — AB (ref 0.61–1.24)
Chloride: 118 mmol/L — ABNORMAL HIGH (ref 98–111)
GFR, EST AFRICAN AMERICAN: 17 mL/min — AB (ref >60)
GFR, EST NON AFRICAN AMERICAN: 15 mL/min — AB (ref >60)
Glucose, Bld: 105 mg/dL — ABNORMAL HIGH (ref 70–99)
Potassium: 5.2 mmol/L — ABNORMAL HIGH (ref 3.5–5.1)
SODIUM: 142 mmol/L (ref 135–145)

## 2018-03-10 LAB — PROTIME-INR
INR: 3.73
PROTHROMBIN TIME: 36.6 s — AB (ref 11.4–15.2)

## 2018-03-10 MED ORDER — SODIUM BICARBONATE 650 MG PO TABS
1300.0000 mg | ORAL_TABLET | Freq: Three times a day (TID) | ORAL | Status: DC
  Administered 2018-03-10 – 2018-03-13 (×9): 1300 mg via ORAL
  Filled 2018-03-10 (×9): qty 2

## 2018-03-10 MED ORDER — PATIROMER SORBITEX CALCIUM 8.4 G PO PACK
8.4000 g | PACK | Freq: Once | ORAL | Status: AC
  Administered 2018-03-10: 8.4 g via ORAL
  Filled 2018-03-10 (×2): qty 1

## 2018-03-10 NOTE — Progress Notes (Signed)
PROGRESS NOTE  Tyler Gray  ZOX:[id number] DOB: [birth year] DOA: 03/08/2018 PCP: Renaye Rakers, MD  Outpatient Specialists: Cardiology, Dr. Verdis Prime Brief Narrative: Tyler Gray is an 83 y.o. male with a history of NICM, chronic systolic CHF s/p BiV pacemaker, PAF on coumadin, stage III CKD who presented with increasing weakness, lower extremity edema and shortness of breath. Pt is difficult historian but appeared volume overloaded. Afebrile, hemodynamically stable. UA with large leuks, > 50 WBC, few bacteria. CMP with K of 5.7 and Cr 3.45 ( baseline 2.3-2.7). BNP > 2200. Troponin slightly elevated at 0.06. Hgb 8.7 down from baseline of 10 otherwise no acute changes on CBC. INR elevated over goal at 3.69. CXR with mild bibasilar atelectasis with stable cardiomegaly and no pulmonary edema. XR of hand with no acute osseous abnormalities, and soft tissue swelling near right 5th MCP joint. CT head/Cervical spine with no acute intracranial abnormalities with cerebral volume loss and chronic small vessel ischemic changes and severe degenerative changes in cervical spine  Lasix was started and cardiology consulted. Lasix has been increased and beta blocker stopped by cardiology. Renal function worsening and nephrology consulted.   Assessment & Plan: Active Problems:   Paroxysmal atrial fibrillation (HCC)   CHRONIC SYSTOLIC HEART FAILURE   Chronic kidney disease, stage IV (severe) (HCC)   Automatic implantable cardioverter-defibrillator in situ   AKI (acute kidney injury) (HCC)   Acute lower UTI   Acute systolic CHF (congestive heart failure) (HCC)   Elevated INR   Hyperkalemia   Elevated troponin   Malnutrition of moderate degree   Pressure injury of skin  Generalized weakness: Multifactorial due to heart failure, possibly also UTI and deconditioning.  - Treat as below - Will get PT/OT evaluations. Uses walker at home.   AKI on stage III CKD: Decreased perfusion from depleted EABV in  addition to chronic renal disease and essentially solitary kidney with chronic right ureteral obstruction. Renal U/S revealed a right kidney that is thinned with moderate hydronephrosis and increased echogenicity. No right ureteral jet was seen. Left kidney is 14.7cm with cortical thinning and echogenicity.  - Monitor closely with diuresis.  - Appreciate nephrology recommendations. Discussed with family that the patient would likely not be a good hemodialysis candidate.     Hyperkalemia: Improving - Repeat veltassa x1 today, continue lasix, recheck in AM.  - Continue telemetry, no ECG changes.   Acute on chronic systolic CHF: s/p ICD.  - lasix 80mg  IV BID, UOP picking up. - Monitor strict I/O (need to have accurate data), daily weights, BMP - Sodium restriction - No ACE/ARB/entresto with renal insufficiency/hyperK. Holding beta blocker with need for diuresis. May need milrinone.  - Echo showed EF 20-25% with akinesis of inferoseptal wall, severe biatrial dilatation with mod-severe MR and severe TR, elevated pulmonary artery pressure.   Ureterolithiasis with right hydronephrosis: Known previously and confirmed on CT stone protocol 6/27. Patient and his wife confirm that it was known years ago and the risks of treatment with his cardiac function were felt to prohibit treatment. - No further intervention planned per discussion with urology 6/27.   Acute lower UTI: UA consistent with infection. - Ceftriaxone - Monitor urine culture.  Atrial Fibrillation, rate controlled:  - Continue telemetry - Holding home coumadin due to elevated INR (remains elevated, checking daily)  Elevated troponin. Likely demand ischemia. EKG reviewed and shows no acute ischemic changes. Trend troponin q 6 H. F/u TTE.   Supratherapeutic INR: 3.6 on admission.  - Hold coumadin -  Daily INR - No indication for reversal.  Irregular liver contour on imaging: Compatible with cirrhosis, though he doesn't carry this  diagnosis. Would be compatible with thrombocytopenia, hypoalbuminemia. Appears compensated with normal AST/ALT. INR elevated due to coumadin. No ascites noted.  - Will defer further work up for now.    DVT prophylaxis: Coumadin Code Status: Full Family Communication: Wife at bedside Disposition Plan: SNF recommended by therapy. Will need to continue monitoring patient's clinical course for definite decisions.  Consultants:   Cardiology, Dr. Jens Som  Nephrology, Dr. Ronalee Belts  Procedures:   Echocardiogram 03/09/2018:  - Left ventricle: The cavity size was severely dilated. Systolic   function was severely reduced. The estimated ejection fraction   was in the range of 20% to 25%. Diffuse hypokinesis. There is   akinesis of the inferoseptal myocardium. The study is not   technically sufficient to allow evaluation of LV diastolic   function. - Aortic valve: There was mild regurgitation. - Aorta: Aortic root dimension: 43 mm (ED). - Ascending aorta: The ascending aorta was mildly dilated. - Mitral valve: Mildly thickened leaflets . There was moderate to   severe regurgitation directed posteriorly. - Left atrium: The atrium was severely dilated. - Right ventricle: The cavity size was moderately dilated. Wall   thickness was normal. Pacer wire or catheter noted in right   ventricle. - Right atrium: The atrium was severely dilated. - Tricuspid valve: There was severe regurgitation. - Pulmonary arteries: Systolic pressure was moderately increased.   PA peak pressure: 50 mm Hg (S).  Antimicrobials:  Ceftriaxone 6/26 >>    Subjective: Mild if any improvement in swelling, breathing ok. Making very frequent low volume urination.     Objective: Vitals:   03/09/18 1958 03/10/18 0544 03/10/18 0814 03/10/18 1130  BP: 96/76 110/67 112/76 112/85  Pulse: (!) 120 (!) 39 65 61  Resp: 18 18 18 18   Temp: (!) 97.5 F (36.4 C) 97.6 F (36.4 C) 99 F (37.2 C) (!) 97.5 F (36.4 C)  TempSrc:  Oral Oral Oral Oral  SpO2: 95% 99% 100% 100%  Weight:  77.2 kg (170 lb 3.2 oz)    Height:        Intake/Output Summary (Last 24 hours) at 03/10/2018 1552 Last data filed at 03/10/2018 1128 Gross per 24 hour  Intake 1200 ml  Output 1925 ml  Net -725 ml   Filed Weights   03/08/18 2019 03/09/18 0500 03/10/18 0544  Weight: 78.4 kg (172 lb 14.4 oz) 78.4 kg (172 lb 14.4 oz) 77.2 kg (170 lb 3.2 oz)   Gen: Pleasant, elderly male in no distress Pulm: Nonlabored breathing room air. Clear. CV: Regular rate and rhythm. No murmur, rub, or gallop. No JVD, + dependent edema. GI: Abdomen soft, non-tender, non-distended, with normoactive bowel sounds.  Ext: Warm, no deformities Skin: Right shin with serous drainage from wound unchanged. Neuro: Alert and oriented. No focal neurological deficits. Psych: Judgement and insight appear fair. Mood euthymic & affect congruent. Behavior is appropriate.    Data Reviewed: I have personally reviewed following labs and imaging studies  CBC: Recent Labs  Lab 03/08/18 1552 03/09/18 0554  WBC 5.4 5.4  NEUTROABS 3.7  --   HGB 8.7* 8.7*  HCT 28.8* 29.2*  MCV 106.3* 108.6*  PLT 100* 95*   Basic Metabolic Panel: Recent Labs  Lab 03/08/18 1552 03/09/18 0554 03/09/18 1529 03/10/18 0815  NA 141 144 143 142  K 5.7* 5.8* 5.4* 5.2*  CL 121* 122* 121* 118*  CO2 14* 13* 16* 16*  GLUCOSE 111* 98 124* 105*  BUN 53* 54* 55* 58*  CREATININE 3.45* 3.48* 3.52* 3.50*  CALCIUM 8.3* 8.3* 8.3* 8.2*   GFR: Estimated Creatinine Clearance: 15.5 mL/min (A) (by C-G formula based on SCr of 3.5 mg/dL (H)). Liver Function Tests: Recent Labs  Lab 03/08/18 1552  AST 30  ALT 22  ALKPHOS 28*  BILITOT 1.7*  PROT 6.5  ALBUMIN 2.6*   No results for input(s): LIPASE, AMYLASE in the last 168 hours. No results for input(s): AMMONIA in the last 168 hours. Coagulation Profile: Recent Labs  Lab 03/08/18 1552 03/09/18 0554 03/10/18 0704  INR 3.69 3.83 3.73   Cardiac  Enzymes: Recent Labs  Lab 03/08/18 1552 03/08/18 2350 03/09/18 0554  TROPONINI 0.06* 0.06* 0.05*   BNP (last 3 results) Recent Labs    10/14/17 1209  PROBNP 15,115*   HbA1C: No results for input(s): HGBA1C in the last 72 hours. CBG: Recent Labs  Lab 03/08/18 1524  GLUCAP 105*   Lipid Profile: No results for input(s): CHOL, HDL, LDLCALC, TRIG, CHOLHDL, LDLDIRECT in the last 72 hours. Thyroid Function Tests: No results for input(s): TSH, T4TOTAL, FREET4, T3FREE, THYROIDAB in the last 72 hours. Anemia Panel: No results for input(s): VITAMINB12, FOLATE, FERRITIN, TIBC, IRON, RETICCTPCT in the last 72 hours. Urine analysis:    Component Value Date/Time   COLORURINE YELLOW 03/08/2018 1628   APPEARANCEUR CLOUDY (A) 03/08/2018 1628   LABSPEC 1.012 03/08/2018 1628   PHURINE 5.0 03/08/2018 1628   GLUCOSEU NEGATIVE 03/08/2018 1628   HGBUR MODERATE (A) 03/08/2018 1628   BILIRUBINUR NEGATIVE 03/08/2018 1628   KETONESUR NEGATIVE 03/08/2018 1628   PROTEINUR 100 (A) 03/08/2018 1628   NITRITE NEGATIVE 03/08/2018 1628   LEUKOCYTESUR LARGE (A) 03/08/2018 1628   Recent Results (from the past 240 hour(s))  Urine culture     Status: Abnormal   Collection Time: 03/08/18  5:18 PM  Result Value Ref Range Status   Specimen Description URINE, CLEAN CATCH  Final   Special Requests   Final    NONE Performed at [facility], [address] [place], [place] [address]    Culture MULTIPLE SPECIES PRESENT, SUGGEST RECOLLECTION (A)  Final   Report Status 03/09/2018 FINAL  Final      Radiology Studies: Dg Chest 2 View  Result Date: 03/08/2018 CLINICAL DATA:  Initial evaluation for acute weakness.  Fall. EXAM: CHEST - 2 VIEW COMPARISON:  Prior radiograph from 04/27/2016. FINDINGS: Left-sided transvenous pacemaker/AICD in place. Cardiomegaly, stable. Mediastinal silhouette normal. Aortic atherosclerosis. Lungs normally inflated. Mild bibasilar atelectatic changes without focal infiltrate.  No pulmonary edema or pleural effusion. No pneumothorax. No acute osseous abnormality. Degenerative changes about the left shoulder. IMPRESSION: 1. Mild bibasilar atelectasis. No other active cardiopulmonary disease. 2. Stable cardiomegaly without pulmonary edema. Electronically Signed   By: Rise Mu M.D.   On: 03/08/2018 16:49   Ct Head Wo Contrast  Result Date: 03/08/2018 CLINICAL DATA:  Altered level of consciousness. No reported injury. Left-sided lean. EXAM: CT HEAD WITHOUT CONTRAST CT CERVICAL SPINE WITHOUT CONTRAST TECHNIQUE: Multidetector CT imaging of the head and cervical spine was performed following the standard protocol without intravenous contrast. Multiplanar CT image reconstructions of the cervical spine were also generated. COMPARISON:  None. FINDINGS: CT HEAD FINDINGS Brain: No evidence of parenchymal hemorrhage or extra-axial fluid collection. No mass lesion, mass effect, or midline shift. No CT evidence of acute infarction. Generalized cerebral volume loss. Nonspecific mild subcortical and periventricular  white matter hypodensity, most in keeping with chronic small vessel ischemic change. No ventriculomegaly. Vascular: No acute abnormality. Skull: No evidence of calvarial fracture. Sinuses/Orbits: The visualized paranasal sinuses are essentially clear. Other:  The mastoid air cells are unopacified. CT CERVICAL SPINE FINDINGS Alignment: Normal cervical lordosis. No facet subluxation. Dens is well positioned between the lateral masses of C1. Mild 2 mm anterolisthesis at C7-T1. Skull base and vertebrae: No acute fracture. No primary bone lesion or focal pathologic process. Soft tissues and spinal canal: No prevertebral edema. No visible canal hematoma. Disc levels: Severe multilevel degenerative disc disease throughout the cervical spine with bulky anterior marginal osteophytes. Moderate bilateral facet arthropathy. Mild degenerative foraminal stenosis on the right at C5-6. Upper  chest: Partially visualized 2 lead left subclavian ICD. Thoracic aortic atherosclerosis. Other: Visualized mastoid air cells appear clear. No discrete thyroid nodules. No pathologically enlarged cervical nodes. IMPRESSION: CT HEAD: 1.  No evidence of acute intracranial abnormality. 2. Generalized cerebral volume loss and mild chronic small vessel ischemic changes in the cerebral white matter. CT CERVICAL SPINE: 1. No cervical spine fracture or facet subluxation. 2. Severe multilevel degenerative changes in the cervical spine as detailed. Mild 2 mm anterolisthesis at C7-T1, probably degenerative. Electronically Signed   By: Delbert Phenix M.D.   On: 03/08/2018 16:42   Ct Cervical Spine Wo Contrast  Result Date: 03/08/2018 CLINICAL DATA:  Altered level of consciousness. No reported injury. Left-sided lean. EXAM: CT HEAD WITHOUT CONTRAST CT CERVICAL SPINE WITHOUT CONTRAST TECHNIQUE: Multidetector CT imaging of the head and cervical spine was performed following the standard protocol without intravenous contrast. Multiplanar CT image reconstructions of the cervical spine were also generated. COMPARISON:  None. FINDINGS: CT HEAD FINDINGS Brain: No evidence of parenchymal hemorrhage or extra-axial fluid collection. No mass lesion, mass effect, or midline shift. No CT evidence of acute infarction. Generalized cerebral volume loss. Nonspecific mild subcortical and periventricular white matter hypodensity, most in keeping with chronic small vessel ischemic change. No ventriculomegaly. Vascular: No acute abnormality. Skull: No evidence of calvarial fracture. Sinuses/Orbits: The visualized paranasal sinuses are essentially clear. Other:  The mastoid air cells are unopacified. CT CERVICAL SPINE FINDINGS Alignment: Normal cervical lordosis. No facet subluxation. Dens is well positioned between the lateral masses of C1. Mild 2 mm anterolisthesis at C7-T1. Skull base and vertebrae: No acute fracture. No primary bone lesion or  focal pathologic process. Soft tissues and spinal canal: No prevertebral edema. No visible canal hematoma. Disc levels: Severe multilevel degenerative disc disease throughout the cervical spine with bulky anterior marginal osteophytes. Moderate bilateral facet arthropathy. Mild degenerative foraminal stenosis on the right at C5-6. Upper chest: Partially visualized 2 lead left subclavian ICD. Thoracic aortic atherosclerosis. Other: Visualized mastoid air cells appear clear. No discrete thyroid nodules. No pathologically enlarged cervical nodes. IMPRESSION: CT HEAD: 1.  No evidence of acute intracranial abnormality. 2. Generalized cerebral volume loss and mild chronic small vessel ischemic changes in the cerebral white matter. CT CERVICAL SPINE: 1. No cervical spine fracture or facet subluxation. 2. Severe multilevel degenerative changes in the cervical spine as detailed. Mild 2 mm anterolisthesis at C7-T1, probably degenerative. Electronically Signed   By: Delbert Phenix M.D.   On: 03/08/2018 16:42   US Renal  Result Date: 03/09/2018 CLINICAL DATA:  Acute renal injury EXAM: RENAL / URINARY TRACT ULTRASOUND COMPLETE COMPARISON:  CT 12/16/2011 FINDINGS: Right Kidney: Length: 12.9 cm. Thinning of the renal cortex is noted with mild-to-moderate hydronephrosis noted. Slight increase in cortical echogenicity. Several hypoechoic  lesions of the liver are identified, the largest appearing anechoic and representing simple cysts measuring 2.4 x 2.2 x 2.7 cm arising off the lateral interpolar aspect and 1.9 x 1.5 x 1.9 cm arising off the lower pole. Several smaller indeterminate hypoechoic lesions are noted more likely to represent small complex cysts with low-level internal echoes noted. No nephrolithiasis is identified. No definite solid vascular masses are identified. Left Kidney: Length: 14.7 cm. Thinning of the renal cortex is also noted on the left. There is slight increase in cortical echogenicity as well. A dominant  cyst measuring 6 x 5.5 x 5 cm is identified arising off the upper pole without worrisome features. Low-level internal echoes may represent debris or acoustic artifacts. No solid vascular lesions are identified. No hydronephrosis. Bladder: Appears normal for degree of bladder distention. Only a left ureteral jet could be documented. IMPRESSION: Increased echogenicity of both kidneys with cortical thinning and bilateral renal cysts some of which appear complex with low-level internal echoes. Increased echogenicity may reflect a component of medical renal disease. Mild-to-moderate right-sided hydronephrosis is noted and there was no demonstrable ureteral jet seen in the bladder to suggest patency of the right ureter. An obstructing calculus, stenosis of the ureter or ureteral lesion is not entirely excluded. Electronically Signed   By: Tollie Eth M.D.   On: 03/09/2018 17:33   Dg Hand Complete Right  Result Date: 03/08/2018 CLINICAL DATA:  Initial evaluation for acute trauma, fall. EXAM: RIGHT HAND - COMPLETE 3+ VIEW COMPARISON:  None. FINDINGS: No acute fracture or dislocation. Bones are diffusely osteopenic. Extensive arthritic changes seen throughout the right hand. Mild soft tissue swelling overlies the right fifth MCP joint. IMPRESSION: 1. No acute osseous abnormality about the right hand. 2. Extensive arthritic changes throughout the right hand with soft tissue swelling overlying the right fifth MCP joint. Electronically Signed   By: Rise Mu M.D.   On: 03/08/2018 16:58   Ct Renal Stone Study  Result Date: 03/10/2018 CLINICAL DATA:  Inpatient. Urinary tract stone, symptomatic. Right hydronephrosis on sonogram. EXAM: CT ABDOMEN AND PELVIS WITHOUT CONTRAST TECHNIQUE: Multidetector CT imaging of the abdomen and pelvis was performed following the standard protocol without IV contrast. COMPARISON:  03/09/2018 renal sonogram. 12/16/2011 CT abdomen/pelvis. FINDINGS: Lower chest: Cardiomegaly. ICD  lead is seen in the right ventricle. Coronary atherosclerosis. Small dependent right pleural effusion. Bronchiectasis with associated 4.8 x 3.4 cm focus of masslike consolidation in the medial basilar left lower lobe (series 4/image 15), previously 4.9 x 3.7 cm on 12/16/2011 CT, not appreciably changed. Hepatobiliary: Diffusely irregular liver surface, compatible with hepatic cirrhosis. No liver mass. Contracted gallbladder with subcentimeter calcified gallstone. No definite gallbladder wall thickening. No biliary ductal dilatation. Pancreas: Normal, with no mass or duct dilation. Spleen: Normal size. No mass. Adrenals/Urinary Tract: Normal adrenals. Obstructing adjacent 11 mm and 6 mm stones in the proximal pelvic segment of the right ureter with moderate right hydroureteronephrosis. Nonobstructing 8 mm stone in the right renal pelvis. Nonobstructing 14 mm lower right renal stone. Additional 8 mm stone in the distal pelvic segment of the right ureter. Layering 6 mm and 4 mm right bladder stones. No left renal stones. No left hydronephrosis. Normal caliber left ureter, with no left ureteral stones. Severe right renal parenchymal atrophy. Moderate left renal parenchymal atrophy. Simple renal cysts in both kidneys, largest 5.7 cm in the posterior upper left kidney. Multiple indeterminate hyperdense and hypodense renal lesions in both kidneys, largest 2.1 cm in the anterior lower right kidney (  series 3/image 41), which has increased from 1.1 cm on 12/16/2011 CT. Otherwise normal bladder. Stomach/Bowel: Small hiatal hernia. Otherwise normal nondistended stomach. Normal caliber small bowel with no small bowel wall thickening. Normal appendix. Normal large bowel with no diverticulosis, large bowel wall thickening or pericolonic fat stranding. Vascular/Lymphatic: Atherosclerotic nonaneurysmal abdominal aorta. No pathologically enlarged lymph nodes in the abdomen or pelvis. Reproductive: Mildly enlarged prostate. Other: No  pneumoperitoneum. Small volume ascites. Moderate anasarca. Musculoskeletal: No aggressive appearing focal osseous lesions. Diffuse patchy sclerosis in the osseous structures, probably renal osteodystrophy. Severe thoracolumbar spondylosis. IMPRESSION: 1. Multiple obstructing stones in the pelvic segment of the right ureter, with moderate right hydroureteronephrosis. Additional nonobstructing right renal and layering right bladder stones. 2. Numerous indeterminate renal lesions in both kidneys, largest 2.1 cm in the anterior lower right kidney, which has increased in size since 2013, cannot exclude renal cell carcinoma. MRI abdomen without and with IV contrast may be obtained on a short term outpatient basis for further characterization. If the patient cannot receive IV gadolinium contrast due to GFR less than 30, recommend attention on follow-up noncontrast CT or MRI abdomen in 6 months. 3. Cirrhosis. No noncontrast CT evidence of liver mass. Small volume ascites. 4. Small dependent right pleural effusion. Chronic bronchiectasis with associated masslike consolidation in the medial basilar left lower lobe, not appreciably changed since 2013, presumably chronic postinfectious/postinflammatory scarring. 5. Chronic findings include: Aortic Atherosclerosis (ICD10-I70.0). Cardiomegaly. Coronary atherosclerosis. Small hiatal hernia. Cholelithiasis. Mildly enlarged prostate. Renal osteodystrophy. Electronically Signed   By: Delbert Phenix M.D.   On: 03/10/2018 00:57    Scheduled Meds: . atorvastatin  40 mg Oral q1800  . feeding supplement (ENSURE ENLIVE)  237 mL Oral BID BM  . furosemide  80 mg Intravenous BID  . multivitamin with minerals  1 tablet Oral Daily  . patiromer  8.4 g Oral Once  . sodium bicarbonate  1,300 mg Oral TID   Continuous Infusions: . cefTRIAXone (ROCEPHIN)  IV 1 g (03/09/18 1841)     LOS: 2 days   Time spent: 25 minutes.  Tyrone Nine, MD [facility] [email or web address] Password  [facility] 03/10/2018, 3:52 PM

## 2018-03-10 NOTE — Clinical Social Work Placement (Signed)
   CLINICAL SOCIAL WORK PLACEMENT  NOTE  Date:  03/10/2018  Patient Details  Name: Tyler Gray MRN: [id number] Date of Birth: 11/25/1932  Clinical Social Work is seeking post-discharge placement for this patient at the Skilled  Nursing Facility level of care (*CSW will initial, date and re-position this form in  chart as items are completed):  Yes   Patient/family provided with [facility] Clinical Social Work Department's list of facilities offering this level of care within the geographic area requested by the patient (or if unable, by the patient's family).  Yes   Patient/family informed of their freedom to choose among providers that offer the needed level of care, that participate in Medicare, Medicaid or managed care program needed by the patient, have an available bed and are willing to accept the patient.  Yes   Patient/family informed of [facility]'s ownership interest in [facility] and [facility], as well as of the fact that they are under no obligation to receive care at these facilities.  PASRR submitted to EDS on 03/10/18     PASRR number received on       Existing PASRR number confirmed on 03/10/18     FL2 transmitted to all facilities in geographic area requested by pt/family on 03/10/18     FL2 transmitted to all facilities within larger geographic area on       Patient informed that his/her managed care company has contracts with or will negotiate with certain facilities, including the following:            Patient/family informed of bed offers received.  Patient chooses bed at       Physician recommends and patient chooses bed at      Patient to be transferred to   on  .  Patient to be transferred to facility by       Patient family notified on   of transfer.  Name of family member notified:        PHYSICIAN Please sign FL2     Additional Comment:    _______________________________________________ Margarito Liner, LCSW 03/10/2018,  4:02 PM

## 2018-03-10 NOTE — Evaluation (Signed)
Physical Therapy Evaluation Patient Details Name: Tyler Gray MRN: [id number] DOB: 11-10-1932 Today's Date: 03/10/2018   History of Present Illness  Tyler Gray is a 84 y.o. male with medical history significant for nonischemic cardiomyopathy, paroxysmal atrial fibrillation on warfarin, chronic systolic heart failure, AICD, hyperlipidemia, and chronic kidney disease stage III    Clinical Impression  Pt admitted with above diagnosis. Pt currently with functional limitations due to the deficits listed below (see PT Problem List). Pt with hx of dementia and unreliable historian, AOx2 at this time. Reports PTA that he was limited community ambulating living at home with wife and 2 daughters, mod I with all mobility utilizing rollator. Upon eval pt presents with deconditioning and weakness, now requiring min A for transfers and only able to ambulate 40' before resting. Pt has had several falls at home recently, presents as a high fall risk. No family present to determine level of assistance, rec ST SNF, if pt able to have 24/7 hands on support then could go home.  Pt will benefit from skilled PT to increase their independence and safety with mobility to allow discharge to the venue listed below.    BP  106/69 seated 101/65 standing     Follow Up Recommendations SNF;Supervision/Assistance - 24 hour (unless 24/7 physical assistance)     Equipment Recommendations  (TBD next venue)    Recommendations for Other Services       Precautions / Restrictions Precautions Precautions: Fall      Mobility  Bed Mobility               General bed mobility comments: OOB at entry   Transfers Overall transfer level: Needs assistance Equipment used: Rolling walker (2 wheeled) Transfers: Sit to/from Stand Sit to Stand: Min assist         General transfer comment: Min A to power up from normal height  Ambulation/Gait Ambulation/Gait assistance: Min guard Gait Distance (Feet): 40  Feet Assistive device: Rolling walker (2 wheeled) Gait Pattern/deviations: Step-to pattern Gait velocity: decreased   General Gait Details: Pt ambulating with rollator, requests to turn aroudn and sit down by time we left the room as he was feels fatigued and weaker than his baseline. SpO2 97%, no SOB  Stairs            Wheelchair Mobility    Modified Rankin (Stroke Patients Only)       Balance Overall balance assessment: Needs assistance   Sitting balance-Leahy Scale: Fair       Standing balance-Leahy Scale: Poor                               Pertinent Vitals/Pain Pain Assessment: No/denies pain    Home Living Family/patient expects to be discharged to:: Private residence Living Arrangements: Spouse/significant other;Children Available Help at Discharge: Family;Available 24 hours/day Type of Home: House Home Access: Level entry     Home Layout: One level Home Equipment: Walker - 4 wheels;Shower seat;Grab bars - toilet;Grab bars - tub/shower      Prior Function Level of Independence: Needs assistance   Gait / Transfers Assistance Needed: limited community ambulation with Rollator  ADL's / Homemaking Assistance Needed: reports independence with bathing and dressing, family helps chores and cooking        Hand Dominance        Extremity/Trunk Assessment   Upper Extremity Assessment Upper Extremity Assessment: Defer to OT evaluation    Lower  Extremity Assessment Lower Extremity Assessment: Generalized weakness(RLE > LLE due to knee pain. gross 4-/5 )       Communication   Communication: No difficulties  Cognition Arousal/Alertness: Awake/alert Behavior During Therapy: WFL for tasks assessed/performed Overall Cognitive Status: History of cognitive impairments - at baseline                                 General Comments: AO to person and place, unaware of what day/month/year "2003" and has vauge understanding of why  he is here. Following commands 100% of the time with slight delay in processing.        General Comments      Exercises     Assessment/Plan    PT Assessment Patient needs continued PT services  PT Problem List Decreased strength;Decreased range of motion;Decreased activity tolerance;Decreased balance;Decreased mobility;Decreased cognition;Decreased coordination;Decreased knowledge of use of DME;Decreased safety awareness;Cardiopulmonary status limiting activity       PT Treatment Interventions DME instruction;Gait training;Stair training;Functional mobility training;Therapeutic activities;Therapeutic exercise;Balance training    PT Goals (Current goals can be found in the Care Plan section)  Acute Rehab PT Goals Patient Stated Goal: get stronger PT Goal Formulation: With patient Time For Goal Achievement: 03/17/18 Potential to Achieve Goals: Good    Frequency Min 3X/week   Barriers to discharge        Co-evaluation               AM-PAC PT "6 Clicks" Daily Activity  Outcome Measure Difficulty turning over in bed (including adjusting bedclothes, sheets and blankets)?: A Little Difficulty moving from lying on back to sitting on the side of the bed? : A Little Difficulty sitting down on and standing up from a chair with arms (e.g., wheelchair, bedside commode, etc,.)?: A Little Help needed moving to and from a bed to chair (including a wheelchair)?: A Little Help needed walking in hospital room?: A Little Help needed climbing 3-5 steps with a railing? : A Lot 6 Click Score: 17    End of Session Equipment Utilized During Treatment: Gait belt Activity Tolerance: Patient limited by fatigue Patient left: in chair;with call bell/phone within reach;with chair alarm set Nurse Communication: Mobility status PT Visit Diagnosis: Unsteadiness on feet (R26.81);Other abnormalities of gait and mobility (R26.89);History of falling (Z91.81);Muscle weakness (generalized)  (M62.81);Repeated falls (R29.6)    Time: 1000-1035 PT Time Calculation (min) (ACUTE ONLY): 35 min   Charges:   PT Evaluation $PT Eval Moderate Complexity: 1 Mod PT Treatments $Gait Training: 8-22 mins   PT G Codes:        Etta Grandchild, PT, DPT Acute Rehab Services Pager: [phone]    Etta Grandchild 03/10/2018, 2:17 PM

## 2018-03-10 NOTE — [place] FL2 (Signed)
[place] MEDICAID FL2 LEVEL OF CARE SCREENING TOOL     IDENTIFICATION  Patient Name: Tyler Gray Birthdate: [birth year] Sex: male Admission Date (Current Location): 03/08/2018  [facility] and [place] Number:  Producer, television/film/video and Address:  [facility] [place], [address], [place], [place] [address]      Provider Number: [id number]  Attending Physician Name and Address:  Tyrone Nine, MD  Relative Name and Phone Number:       Current Level of Care: Hospital Recommended Level of Care: Skilled Nursing Facility Prior Approval Number:    Date Approved/Denied:   PASRR Number: [id number]  Discharge Plan: SNF    Current Diagnoses: Patient Active Problem List   Diagnosis Date Noted  . Malnutrition of moderate degree 03/09/2018  . Pressure injury of skin 03/09/2018  . AKI (acute kidney injury) (HCC) 03/08/2018  . Acute lower UTI 03/08/2018  . Acute systolic CHF (congestive heart failure) (HCC) 03/08/2018  . Elevated INR 03/08/2018  . Hyperkalemia 03/08/2018  . Elevated troponin 03/08/2018  . Pacemaker lead failure 04/28/2017  . Encounter for therapeutic drug monitoring 12/23/2014  . Paroxysmal atrial fibrillation (HCC) 02/10/2010  . CHRONIC SYSTOLIC HEART FAILURE 02/10/2010  . Chronic kidney disease, stage IV (severe) (HCC) 02/10/2010  . Automatic implantable cardioverter-defibrillator in situ 02/10/2010    Orientation RESPIRATION BLADDER Height & Weight     Self, Time, Situation, Place  Normal Continent Weight: 170 lb 3.2 oz (77.2 kg)(scale a) Height:  5\' 8"  (172.7 cm)  BEHAVIORAL SYMPTOMS/MOOD NEUROLOGICAL BOWEL NUTRITION STATUS  (None) (None) Continent Diet(2 gram sodium)  AMBULATORY STATUS COMMUNICATION OF NEEDS Skin   Limited Assist Verbally PU Stage and Appropriate Care     PU Stage 3 Dressing: No Dressing(Right leg: 11 x 9 cm)                 Personal Care Assistance Level of Assistance  Bathing, Feeding, Dressing Bathing  Assistance: Limited assistance Feeding assistance: Limited assistance Dressing Assistance: Limited assistance     Functional Limitations Info  Sight, Hearing, Speech Sight Info: Adequate Hearing Info: Adequate Speech Info: Adequate    SPECIAL CARE FACTORS FREQUENCY  PT (By licensed PT), OT (By licensed OT)     PT Frequency: 5 x week OT Frequency: 5 x week            Contractures Contractures Info: Not present    Additional Factors Info  Code Status, Allergies Code Status Info: Full Allergies Info: NKDA           Current Medications (03/10/2018):  This is the current hospital active medication list Current Facility-Administered Medications  Medication Dose Route Frequency Provider Last Rate Last Dose  . acetaminophen (TYLENOL) tablet 650 mg  650 mg Oral Q6H PRN Roberto Scales D, MD   650 mg at 03/09/18 0809  . atorvastatin (LIPITOR) tablet 40 mg  40 mg Oral q1800 Roberto Scales D, MD   40 mg at 03/09/18 1728  . cefTRIAXone (ROCEPHIN) 1 g in sodium chloride 0.9 % 100 mL IVPB  1 g Intravenous Q24H Roberto Scales D, MD 200 mL/hr at 03/09/18 1841 1 g at 03/09/18 1841  . feeding supplement (ENSURE ENLIVE) (ENSURE ENLIVE) liquid 237 mL  237 mL Oral BID BM Hazeline Junker B, MD      . furosemide (LASIX) injection 80 mg  80 mg Intravenous BID Georgie Chard D, NP   80 mg at 03/10/18 0936  . multivitamin with minerals tablet 1 tablet  1 tablet Oral Daily Tyrone Nine, MD   1 tablet at 03/10/18 0947  . ondansetron (ZOFRAN) tablet 4 mg  4 mg Oral Q6H PRN Roberto Scales D, MD       Or  . ondansetron (ZOFRAN) injection 4 mg  4 mg Intravenous Q6H PRN Roberto Scales D, MD      . patiromer (VELTASSA) packet 8.4 g  8.4 g Oral Once Hazeline Junker B, MD      . polyethylene glycol (MIRALAX / GLYCOLAX) packet 17 g  17 g Oral Daily PRN Roberto Scales D, MD      . sodium bicarbonate tablet 1,300 mg  1,300 mg Oral TID Maxie Barb, MD         Discharge Medications: Please see discharge  summary for a list of discharge medications.  Relevant Imaging Results:  Relevant Lab Results:   Additional Information SS#: [id number]  Margarito Liner, LCSW

## 2018-03-10 NOTE — Progress Notes (Addendum)
Progress Note  Patient Name: Tyler Gray Date of Encounter: 03/10/2018  Primary Cardiologist: Lesleigh Noe, MD   Subjective   Feels a bit better today. No significant dyspnea at rest. No chest pain or palpitations.   Inpatient Medications    Scheduled Meds: . atorvastatin  40 mg Oral q1800  . feeding supplement (ENSURE ENLIVE)  237 mL Oral BID BM  . furosemide  80 mg Intravenous BID  . multivitamin with minerals  1 tablet Oral Daily   Continuous Infusions: . cefTRIAXone (ROCEPHIN)  IV 1 g (03/09/18 1841)   PRN Meds: acetaminophen, ondansetron **OR** ondansetron (ZOFRAN) IV, polyethylene glycol   Vital Signs    Vitals:   03/09/18 1339 03/09/18 1958 03/10/18 0544 03/10/18 0814  BP: 90/71 96/76 110/67 112/76  Pulse: 63 (!) 120 (!) 39 65  Resp:  18 18 18   Temp: (!) 97.2 F (36.2 C) (!) 97.5 F (36.4 C) 97.6 F (36.4 C) 99 F (37.2 C)  TempSrc: Oral Oral Oral Oral  SpO2: 100% 95% 99% 100%  Weight:   170 lb 3.2 oz (77.2 kg)   Height:        Intake/Output Summary (Last 24 hours) at 03/10/2018 0854 Last data filed at 03/10/2018 4098 Gross per 24 hour  Intake 960 ml  Output 1626 ml  Net -666 ml   Filed Weights   03/08/18 2019 03/09/18 0500 03/10/18 0544  Weight: 172 lb 14.4 oz (78.4 kg) 172 lb 14.4 oz (78.4 kg) 170 lb 3.2 oz (77.2 kg)    Telemetry    Paced rhytym, mid 60s - Personally Reviewed  ECG    6/26/ afib 64 bpm - Personally Reviewed  Physical Exam   GEN: Elderly black male, No acute distress.   Neck: No JVD Cardiac: paced rhythm, no murmurs, rubs, or gallops.  Respiratory: crackles at the LLL base.  GI: Soft, nontender, non-distended  MS: 2+ bilateral LEE, pitting, right leg is wrapped. Neuro:  Nonfocal  Psych: Normal affect   Labs    Chemistry Recent Labs  Lab 03/08/18 1552 03/09/18 0554 03/09/18 1529  NA 141 144 143  K 5.7* 5.8* 5.4*  CL 121* 122* 121*  CO2 14* 13* 16*  GLUCOSE 111* 98 124*  BUN 53* 54* 55*  CREATININE  3.45* 3.48* 3.52*  CALCIUM 8.3* 8.3* 8.3*  PROT 6.5  --   --   ALBUMIN 2.6*  --   --   AST 30  --   --   ALT 22  --   --   ALKPHOS 28*  --   --   BILITOT 1.7*  --   --   GFRNONAA 15* 15* 15*  GFRAA 17* 17* 17*  ANIONGAP 6 9 6      Hematology Recent Labs  Lab 03/08/18 1552 03/09/18 0554  WBC 5.4 5.4  RBC 2.71* 2.69*  HGB 8.7* 8.7*  HCT 28.8* 29.2*  MCV 106.3* 108.6*  MCH 32.1 32.3  MCHC 30.2 29.8*  RDW 15.5 15.7*  PLT 100* 95*    Cardiac Enzymes Recent Labs  Lab 03/08/18 1552 03/08/18 2350 03/09/18 0554  TROPONINI 0.06* 0.06* 0.05*   No results for input(s): TROPIPOC in the last 168 hours.   BNP Recent Labs  Lab 03/08/18 1552  BNP 2,285.4*     DDimer No results for input(s): DDIMER in the last 168 hours.   Radiology    Dg Chest 2 View  Result Date: 03/08/2018 CLINICAL DATA:  Initial evaluation for acute  weakness.  Fall. EXAM: CHEST - 2 VIEW COMPARISON:  Prior radiograph from 04/27/2016. FINDINGS: Left-sided transvenous pacemaker/AICD in place. Cardiomegaly, stable. Mediastinal silhouette normal. Aortic atherosclerosis. Lungs normally inflated. Mild bibasilar atelectatic changes without focal infiltrate. No pulmonary edema or pleural effusion. No pneumothorax. No acute osseous abnormality. Degenerative changes about the left shoulder. IMPRESSION: 1. Mild bibasilar atelectasis. No other active cardiopulmonary disease. 2. Stable cardiomegaly without pulmonary edema. Electronically Signed   By: Rise Mu M.D.   On: 03/08/2018 16:49   Ct Head Wo Contrast  Result Date: 03/08/2018 CLINICAL DATA:  Altered level of consciousness. No reported injury. Left-sided lean. EXAM: CT HEAD WITHOUT CONTRAST CT CERVICAL SPINE WITHOUT CONTRAST TECHNIQUE: Multidetector CT imaging of the head and cervical spine was performed following the standard protocol without intravenous contrast. Multiplanar CT image reconstructions of the cervical spine were also generated. COMPARISON:   None. FINDINGS: CT HEAD FINDINGS Brain: No evidence of parenchymal hemorrhage or extra-axial fluid collection. No mass lesion, mass effect, or midline shift. No CT evidence of acute infarction. Generalized cerebral volume loss. Nonspecific mild subcortical and periventricular white matter hypodensity, most in keeping with chronic small vessel ischemic change. No ventriculomegaly. Vascular: No acute abnormality. Skull: No evidence of calvarial fracture. Sinuses/Orbits: The visualized paranasal sinuses are essentially clear. Other:  The mastoid air cells are unopacified. CT CERVICAL SPINE FINDINGS Alignment: Normal cervical lordosis. No facet subluxation. Dens is well positioned between the lateral masses of C1. Mild 2 mm anterolisthesis at C7-T1. Skull base and vertebrae: No acute fracture. No primary bone lesion or focal pathologic process. Soft tissues and spinal canal: No prevertebral edema. No visible canal hematoma. Disc levels: Severe multilevel degenerative disc disease throughout the cervical spine with bulky anterior marginal osteophytes. Moderate bilateral facet arthropathy. Mild degenerative foraminal stenosis on the right at C5-6. Upper chest: Partially visualized 2 lead left subclavian ICD. Thoracic aortic atherosclerosis. Other: Visualized mastoid air cells appear clear. No discrete thyroid nodules. No pathologically enlarged cervical nodes. IMPRESSION: CT HEAD: 1.  No evidence of acute intracranial abnormality. 2. Generalized cerebral volume loss and mild chronic small vessel ischemic changes in the cerebral white matter. CT CERVICAL SPINE: 1. No cervical spine fracture or facet subluxation. 2. Severe multilevel degenerative changes in the cervical spine as detailed. Mild 2 mm anterolisthesis at C7-T1, probably degenerative. Electronically Signed   By: Delbert Phenix M.D.   On: 03/08/2018 16:42   Ct Cervical Spine Wo Contrast  Result Date: 03/08/2018 CLINICAL DATA:  Altered level of consciousness. No  reported injury. Left-sided lean. EXAM: CT HEAD WITHOUT CONTRAST CT CERVICAL SPINE WITHOUT CONTRAST TECHNIQUE: Multidetector CT imaging of the head and cervical spine was performed following the standard protocol without intravenous contrast. Multiplanar CT image reconstructions of the cervical spine were also generated. COMPARISON:  None. FINDINGS: CT HEAD FINDINGS Brain: No evidence of parenchymal hemorrhage or extra-axial fluid collection. No mass lesion, mass effect, or midline shift. No CT evidence of acute infarction. Generalized cerebral volume loss. Nonspecific mild subcortical and periventricular white matter hypodensity, most in keeping with chronic small vessel ischemic change. No ventriculomegaly. Vascular: No acute abnormality. Skull: No evidence of calvarial fracture. Sinuses/Orbits: The visualized paranasal sinuses are essentially clear. Other:  The mastoid air cells are unopacified. CT CERVICAL SPINE FINDINGS Alignment: Normal cervical lordosis. No facet subluxation. Dens is well positioned between the lateral masses of C1. Mild 2 mm anterolisthesis at C7-T1. Skull base and vertebrae: No acute fracture. No primary bone lesion or focal pathologic process. Soft tissues and  spinal canal: No prevertebral edema. No visible canal hematoma. Disc levels: Severe multilevel degenerative disc disease throughout the cervical spine with bulky anterior marginal osteophytes. Moderate bilateral facet arthropathy. Mild degenerative foraminal stenosis on the right at C5-6. Upper chest: Partially visualized 2 lead left subclavian ICD. Thoracic aortic atherosclerosis. Other: Visualized mastoid air cells appear clear. No discrete thyroid nodules. No pathologically enlarged cervical nodes. IMPRESSION: CT HEAD: 1.  No evidence of acute intracranial abnormality. 2. Generalized cerebral volume loss and mild chronic small vessel ischemic changes in the cerebral white matter. CT CERVICAL SPINE: 1. No cervical spine fracture or  facet subluxation. 2. Severe multilevel degenerative changes in the cervical spine as detailed. Mild 2 mm anterolisthesis at C7-T1, probably degenerative. Electronically Signed   By: Delbert Phenix M.D.   On: 03/08/2018 16:42   US Renal  Result Date: 03/09/2018 CLINICAL DATA:  Acute renal injury EXAM: RENAL / URINARY TRACT ULTRASOUND COMPLETE COMPARISON:  CT 12/16/2011 FINDINGS: Right Kidney: Length: 12.9 cm. Thinning of the renal cortex is noted with mild-to-moderate hydronephrosis noted. Slight increase in cortical echogenicity. Several hypoechoic lesions of the liver are identified, the largest appearing anechoic and representing simple cysts measuring 2.4 x 2.2 x 2.7 cm arising off the lateral interpolar aspect and 1.9 x 1.5 x 1.9 cm arising off the lower pole. Several smaller indeterminate hypoechoic lesions are noted more likely to represent small complex cysts with low-level internal echoes noted. No nephrolithiasis is identified. No definite solid vascular masses are identified. Left Kidney: Length: 14.7 cm. Thinning of the renal cortex is also noted on the left. There is slight increase in cortical echogenicity as well. A dominant cyst measuring 6 x 5.5 x 5 cm is identified arising off the upper pole without worrisome features. Low-level internal echoes may represent debris or acoustic artifacts. No solid vascular lesions are identified. No hydronephrosis. Bladder: Appears normal for degree of bladder distention. Only a left ureteral jet could be documented. IMPRESSION: Increased echogenicity of both kidneys with cortical thinning and bilateral renal cysts some of which appear complex with low-level internal echoes. Increased echogenicity may reflect a component of medical renal disease. Mild-to-moderate right-sided hydronephrosis is noted and there was no demonstrable ureteral jet seen in the bladder to suggest patency of the right ureter. An obstructing calculus, stenosis of the ureter or ureteral  lesion is not entirely excluded. Electronically Signed   By: Tollie Eth M.D.   On: 03/09/2018 17:33   Dg Hand Complete Right  Result Date: 03/08/2018 CLINICAL DATA:  Initial evaluation for acute trauma, fall. EXAM: RIGHT HAND - COMPLETE 3+ VIEW COMPARISON:  None. FINDINGS: No acute fracture or dislocation. Bones are diffusely osteopenic. Extensive arthritic changes seen throughout the right hand. Mild soft tissue swelling overlies the right fifth MCP joint. IMPRESSION: 1. No acute osseous abnormality about the right hand. 2. Extensive arthritic changes throughout the right hand with soft tissue swelling overlying the right fifth MCP joint. Electronically Signed   By: Rise Mu M.D.   On: 03/08/2018 16:58   Ct Renal Stone Study  Result Date: 03/10/2018 CLINICAL DATA:  Inpatient. Urinary tract stone, symptomatic. Right hydronephrosis on sonogram. EXAM: CT ABDOMEN AND PELVIS WITHOUT CONTRAST TECHNIQUE: Multidetector CT imaging of the abdomen and pelvis was performed following the standard protocol without IV contrast. COMPARISON:  03/09/2018 renal sonogram. 12/16/2011 CT abdomen/pelvis. FINDINGS: Lower chest: Cardiomegaly. ICD lead is seen in the right ventricle. Coronary atherosclerosis. Small dependent right pleural effusion. Bronchiectasis with associated 4.8 x 3.4 cm focus  of masslike consolidation in the medial basilar left lower lobe (series 4/image 15), previously 4.9 x 3.7 cm on 12/16/2011 CT, not appreciably changed. Hepatobiliary: Diffusely irregular liver surface, compatible with hepatic cirrhosis. No liver mass. Contracted gallbladder with subcentimeter calcified gallstone. No definite gallbladder wall thickening. No biliary ductal dilatation. Pancreas: Normal, with no mass or duct dilation. Spleen: Normal size. No mass. Adrenals/Urinary Tract: Normal adrenals. Obstructing adjacent 11 mm and 6 mm stones in the proximal pelvic segment of the right ureter with moderate right  hydroureteronephrosis. Nonobstructing 8 mm stone in the right renal pelvis. Nonobstructing 14 mm lower right renal stone. Additional 8 mm stone in the distal pelvic segment of the right ureter. Layering 6 mm and 4 mm right bladder stones. No left renal stones. No left hydronephrosis. Normal caliber left ureter, with no left ureteral stones. Severe right renal parenchymal atrophy. Moderate left renal parenchymal atrophy. Simple renal cysts in both kidneys, largest 5.7 cm in the posterior upper left kidney. Multiple indeterminate hyperdense and hypodense renal lesions in both kidneys, largest 2.1 cm in the anterior lower right kidney (series 3/image 41), which has increased from 1.1 cm on 12/16/2011 CT. Otherwise normal bladder. Stomach/Bowel: Small hiatal hernia. Otherwise normal nondistended stomach. Normal caliber small bowel with no small bowel wall thickening. Normal appendix. Normal large bowel with no diverticulosis, large bowel wall thickening or pericolonic fat stranding. Vascular/Lymphatic: Atherosclerotic nonaneurysmal abdominal aorta. No pathologically enlarged lymph nodes in the abdomen or pelvis. Reproductive: Mildly enlarged prostate. Other: No pneumoperitoneum. Small volume ascites. Moderate anasarca. Musculoskeletal: No aggressive appearing focal osseous lesions. Diffuse patchy sclerosis in the osseous structures, probably renal osteodystrophy. Severe thoracolumbar spondylosis. IMPRESSION: 1. Multiple obstructing stones in the pelvic segment of the right ureter, with moderate right hydroureteronephrosis. Additional nonobstructing right renal and layering right bladder stones. 2. Numerous indeterminate renal lesions in both kidneys, largest 2.1 cm in the anterior lower right kidney, which has increased in size since 2013, cannot exclude renal cell carcinoma. MRI abdomen without and with IV contrast may be obtained on a short term outpatient basis for further characterization. If the patient cannot  receive IV gadolinium contrast due to GFR less than 30, recommend attention on follow-up noncontrast CT or MRI abdomen in 6 months. 3. Cirrhosis. No noncontrast CT evidence of liver mass. Small volume ascites. 4. Small dependent right pleural effusion. Chronic bronchiectasis with associated masslike consolidation in the medial basilar left lower lobe, not appreciably changed since 2013, presumably chronic postinfectious/postinflammatory scarring. 5. Chronic findings include: Aortic Atherosclerosis (ICD10-I70.0). Cardiomegaly. Coronary atherosclerosis. Small hiatal hernia. Cholelithiasis. Mildly enlarged prostate. Renal osteodystrophy. Electronically Signed   By: Delbert Phenix M.D.   On: 03/10/2018 00:57    Cardiac Studies   2D Echo 03/09/18 Study Conclusions  - Left ventricle: The cavity size was severely dilated. Systolic   function was severely reduced. The estimated ejection fraction   was in the range of 20% to 25%. Diffuse hypokinesis. There is   akinesis of the inferoseptal myocardium. The study is not   technically sufficient to allow evaluation of LV diastolic   function. - Aortic valve: There was mild regurgitation. - Aorta: Aortic root dimension: 43 mm (ED). - Ascending aorta: The ascending aorta was mildly dilated. - Mitral valve: Mildly thickened leaflets . There was moderate to   severe regurgitation directed posteriorly. - Left atrium: The atrium was severely dilated. - Right ventricle: The cavity size was moderately dilated. Wall   thickness was normal. Pacer wire or catheter noted in right  ventricle. - Right atrium: The atrium was severely dilated. - Tricuspid valve: There was severe regurgitation. - Pulmonary arteries: Systolic pressure was moderately increased.   PA peak pressure: 50 mm Hg (S).   Patient Profile     84 year old male with past medical history of nonischemic cardiomyopathy, atrial fibrillation on chronic Coumadin, chronic systolic congestive heart  failure, prior Biv ICD with nonfunctional atrial lead, chronic stage III kidney disease, being seen for evaluation of acute on chronic systolic congestive heart failure, at the request of Dr. Jarvis Newcomer, Internal Medicine.  Assessment & Plan    1. Acute on Chronic Systolic CHF: known NICM w/ EF of 20-25%. BNP on admit elevated at 2285. Also with evidence of significant volume overload on exam, although edema felt likely multifactorial 2/2 acute CHF, worsening renal function and hypoalbuminemia (also ? Cirrhosis, see below). Lasix increased to 80 mg IV BID yesterday but only 1.6L in UOP yesterday. CT renal stone study conducted yesterday and there appears to be multiple obstructing stones in the pelvic segment of the right ureter, with moderate right hydroureteronephrosis. This likely affecting his UOP. IM and urology to address this. He continues to have crackles at the LLL base that do not clear w/ cough. Continue IV Lasix.   2. Nonischemic Cardiomyopathy: repeat echo this admit shows EF of 20-25% with diffuse hypokinesis and akinesis of the inferoseptal myocardium. Previous echo in 2011 showed an EF of 15-20%. BB was held on admit due to acute CHF, worsening renal function and soft BP. No ACE/ARB or Entresto due to baseline renal function. BP remains too soft for initiation of hydralazine and nitrates.   3. Atrial Fibrillation: paced rhythm on tele, with rate controlled in the 60s. BB remains on hold, per above. On chronic coumadin as outpatient, however this is currently on hold due to suprathearpeutic INR> 3.    4. Acute on Chronic Kidney Disease, Stage III:  Last SCr prior to this admit was 2.7 in Feb 2019. Admit SCr up at 3.45. This has continued to rise >>3.48>>3.52. There were thoughts of possible cardiorenal syndrome and consideration of addition of milrinone, however CT of abdomen and pelvis confirmed multiple obstructing stones in the pelvic segment of the right ureter, with moderate right  hydroureteronephrosis. This likely affecting his UOP. Suspect post renal AKI. Further management per IM, urology and ? Nephrology.   5. Elevated Troponin: flat low level trend, 0.06>>0.06>>0.05. No anginal symptoms. Suspect mild trop elevation is 2/2 acute CHF. No plans for ischemic w/u.   6. UTI: management per primary team. On IV Rocephin, 1 g Q24 hr.   7. ? Cirrhosis: this is not noted in PMH, however CT of abdomen and Pelvis 6/27 showed evidence of liver mass and small volume ascites. Will defer further w/u to primary team.   For questions or updates, please contact [facility] Please consult [email or web address] for contact info under Cardiology/STEMI.      Signed, Robbie Lis, PA-C  03/10/2018, 8:54 AM    As above, patient seen and examined.  Patient denies dyspnea or chest pain.  He remains volume overloaded.  Will continue present dose of Lasix and follow renal function.  Apparently no plans for urologic intervention. Agree with nephrology evaluation.  Prognosis likely poor in the setting of worsening renal function. May need palliative care consult. Olga Millers, MD

## 2018-03-10 NOTE — Progress Notes (Signed)
OT Evaluation  Pt reports he managed BADL mod I PTA. Currently pt requires mod-max assist for sit to stand and min assist for functional mobility. Pt additionally requires min-max assist for ADL. Recommending SNF for follow up to maximize independence and safety with ADL and functional mobility prior to return home. Pt would benefit from continued skilled OT to address established goals.    03/10/18 1428  OT Visit Information  Last OT Received On 03/10/18  Assistance Needed +1  History of Present Illness Tyler Gray is a 84 y.o. male with medical history significant for nonischemic cardiomyopathy, paroxysmal atrial fibrillation on warfarin, chronic systolic heart failure, AICD, hyperlipidemia, and chronic kidney disease stage III  Precautions  Precautions Fall  Restrictions  Weight Bearing Restrictions No  Home Living  Family/patient expects to be discharged to: Private residence  Living Arrangements Spouse/significant other;Children  Available Help at Discharge Family;Available 24 hours/day  Type of Home House  Home Access Level entry  Home Layout One level  Bathroom Shower/Tub Walk-in IT trainer - 4 wheels;Shower seat;Grab bars - toilet;Grab bars - tub/shower  Prior Function  Level of Independence Needs assistance  Gait / Transfers Assistance Needed limited community ambulation with Rollator  ADL's / Homemaking Assistance Needed reports independence with bathing and dressing, family helps chores and cooking  Communication  Communication No difficulties  Pain Assessment  Pain Assessment No/denies pain  Cognition  Arousal/Alertness Awake/alert  Behavior During Therapy WFL for tasks assessed/performed  Overall Cognitive Status No family/caregiver present to determine baseline cognitive functioning  Area of Impairment Orientation;Following commands;Safety/judgement;Problem solving  Orientation Level Disoriented to;Time;Situation   Following Commands Follows one step commands consistently  Safety/Judgement Decreased awareness of safety;Decreased awareness of deficits  Problem Solving Slow processing;Decreased initiation;Requires verbal cues  Upper Extremity Assessment  Upper Extremity Assessment Generalized weakness  Lower Extremity Assessment  Lower Extremity Assessment Defer to PT evaluation  Cervical / Trunk Assessment  Cervical / Trunk Assessment Kyphotic  ADL  Overall ADL's  Needs assistance/impaired  Eating/Feeding Set up;Sitting  Grooming Minimal assistance;Standing;Wash/dry hands  Grooming Details (indicate cue type and reason) assist for standing balance   Upper Body Bathing Min guard;Sitting  Lower Body Bathing Maximal assistance;Sit to/from stand  Upper Body Dressing  Min guard;Sitting  Lower Body Dressing Maximal assistance;Sit to/from stand  Toilet Transfer Maximal assistance;Minimal assistance;Ambulation;Regular Toilet;Grab bars;RW  Statistician Details (indicate cue type and reason) Max assist for sit to stand from toilet, min assist for functional mobility  Toileting- Architect and Hygiene Min guard;Sitting/lateral lean  Toileting - Clothing Manipulation Details (indicate cue type and reason) for peri care  Functional mobility during ADLs Minimal assistance;Rolling walker  Bed Mobility  General bed mobility comments Pt OOB in chair upon arrival  Transfers  Overall transfer level Needs assistance  Equipment used Rolling walker (2 wheeled)  Transfers Sit to/from Stand  Sit to Stand Mod assist;Max assist  General transfer comment Mod assist to boost up from chair, max assist from toilet. Cues for hand placement and technique.  Balance  Overall balance assessment Needs assistance  Sitting-balance support Feet supported  Sitting balance-Leahy Scale Fair  Standing balance support No upper extremity supported;During functional activity  Standing balance-Leahy Scale Poor  Standing  balance comment Min assist for balance without UE support  OT - End of Session  Equipment Utilized During Treatment Gait belt;Rolling walker  Activity Tolerance Patient tolerated treatment well  Patient left in chair;with call bell/phone within reach;with chair alarm  set  OT Assessment  OT Recommendation/Assessment Patient needs continued OT Services  OT Visit Diagnosis Unsteadiness on feet (R26.81);Repeated falls (R29.6);Muscle weakness (generalized) (M62.81)  OT Problem List Decreased strength;Decreased activity tolerance;Impaired balance (sitting and/or standing);Decreased cognition;Decreased safety awareness;Decreased knowledge of use of DME or AE  OT Plan  OT Frequency (ACUTE ONLY) Min 2X/week  OT Treatment/Interventions (ACUTE ONLY) Self-care/ADL training;Therapeutic exercise;Energy conservation;DME and/or AE instruction;Therapeutic activities;Patient/family education;Balance training;Cognitive remediation/compensation  AM-PAC OT "6 Clicks" Daily Activity Outcome Measure  Help from another person eating meals? 3  Help from another person taking care of personal grooming? 3  Help from another person toileting, which includes using toliet, bedpan, or urinal? 2  Help from another person bathing (including washing, rinsing, drying)? 2  Help from another person to put on and taking off regular upper body clothing? 3  Help from another person to put on and taking off regular lower body clothing? 2  6 Click Score 15  ADL G Code Conversion CK  OT Recommendation  Follow Up Recommendations SNF;Supervision/Assistance - 24 hour  OT Equipment 3 in 1 bedside commode  Individuals Consulted  Consulted and Agree with Results and Recommendations Patient  Acute Rehab OT Goals  Patient Stated Goal get stronger  OT Goal Formulation With patient  Time For Goal Achievement 03/24/18  Potential to Achieve Goals Good  OT Time Calculation  OT Start Time (ACUTE ONLY) 1139  OT Stop Time (ACUTE ONLY) 1202   OT Time Calculation (min) 23 min  OT General Charges  $OT Visit 1 Visit  OT Evaluation  $OT Eval Moderate Complexity 1 Mod  OT Treatments  $Self Care/Home Management  8-22 mins  Written Expression  Dominant Hand Right   Inna Tisdell A. Brett Albino, M.S., OTR/L Acute Rehab Department: [phone]

## 2018-03-10 NOTE — Clinical Social Work Note (Signed)
Clinical Social Work Assessment  Patient Details  Name: Tyler Gray MRN: [id number] Date of Birth: 1933/04/04  Date of referral:  03/10/18               Reason for consult:  Facility Placement, Discharge Planning                Permission sought to share information with:  Facility Sport and exercise psychologist, Family Supports Permission granted to share information::  Yes, Verbal Permission Granted  Name::     Karlos Scadden  Agency::  SNF's  Relationship::  Wife  Contact Information:  [phone]  Housing/Transportation Living arrangements for the past 2 months:  Single Family Home Source of Information:  Patient, Medical Team, Spouse Patient Interpreter Needed:  None Criminal Activity/Legal Involvement Pertinent to Current Situation/Hospitalization:  No - Comment as needed Significant Relationships:  Adult Children, Spouse Lives with:  Spouse, Adult Children Do you feel safe going back to the place where you live?  Yes Need for family participation in patient care:  Yes (Comment)  Care giving concerns:  PT recommending SNF once medically stable for discharge.   Social Worker assessment / plan:  CSW met with patient. No supports at bedside. His wife had just left to go home. CSW introduced role and explained that PT recommendations would be discussed. Patient unsure if he wants SNF and gave CSW permission to contact his wife. SNF list left in the room for them to review. CSW called patient's wife and notified her of SNF recommendation. She stated she would have to discuss it with their children. She does not think patient would want to be in a facility. Will have weekend CSW follow up. No further concerns. CSW encouraged patient and his wife to contact CSW as needed. CSW will continue to follow patient and his wife for support and facilitate discharge to SNF, if agreeable, once medically stable.  Employment status:  Retired Nurse, adult PT Recommendations:   [place] / Referral to community resources:  [place]  Patient/Family's Response to care:  Patient and wife will consider SNF. Patient's family supportive and involved in patient's care. Patient and his wife appreciated social work intervention.  Patient/Family's Understanding of and Emotional Response to Diagnosis, Current Treatment, and Prognosis:  Patient and his wife have a good understanding of the reason for admission and his need for therapy after discharge. Patient and his wife appear happy with hospital care.  Emotional Assessment Appearance:  Appears stated age Attitude/Demeanor/Rapport:  Engaged, Gracious Affect (typically observed):  Appropriate, Calm, Pleasant Orientation:  Oriented to Self, Oriented to Place, Oriented to  Time, Oriented to Situation Alcohol / Substance use:  Never Used Psych involvement (Current and /or in the community):  No (Comment)  Discharge Needs  Concerns to be addressed:  Care Coordination Readmission within the last 30 days:  No Current discharge risk:  Dependent with Mobility Barriers to Discharge:  Continued Medical Work up, [place], LCSW 03/10/2018, 3:59 PM

## 2018-03-10 NOTE — Care Management Note (Signed)
Case Management Note  Patient Details  Name: Tyler Gray MRN: [id number] Date of Birth: July 17, 1933  Subjective/Objective:  AKI                 Action/Plan: Patient lives at home with spouse; CM talked to patient with wife at the bedside; PCP - Dr Felipa Emory; has private insurance with [facility] Medicare with prescription drug coverage; pharmacy of choice is [facility] and mail order pharmacy with [facility]; DME - walker and cane at home; spouse stated that he has [facility] services as prior to admission; CM will continue to follow for progression of care.   Expected Discharge Date:    possibly 03/13/2018              Expected Discharge Plan:  Skilled Nursing Facility vs HHC  HH Arranged:  RN, PT [facility] Agency:  [facility]  Status of Service:  In process, will continue to follow  [name] [phone] 03/10/2018, 3:33 PM

## 2018-03-10 NOTE — Consult Note (Addendum)
[facility] Nephrology Consultation Note  Requesting MD: Dr. Bonner Puna Reason for consult: AKI on CKD  HPI:  Tyler Gray is a 84 y.o. male.  With history of A. fib on anticoagulation, chronic systolic CHF status post biventricular pacemaker (EF 20%), CKD, nephrolithiasis, presented with fall, generalized weakness, shortness of breath and lower extremity edema.  Pt had last serum creatinine level of 2.71 in 10/2017.  During admission patient was found to have serum creatinine level of 3.4 with potassium of 5.7.  BNP was more than 2200 with mild elevation in troponin level.  Treated with IV Lasix for CHF exacerbation.  Serum creatinine level was 3.5 today however with decreased urine output. In imaging studies patient was found to have right moderate hydroureteronephrosis with multiple obstructing stones. As per Dr. Bonner Puna, it was discussed with the urologist.  Patient is known to have right ureteral stone and apparently he declined treatment in 2013.  Denied NSAIDs use.  No recent IV contrast. Today, patient reported that he is feeling better.  Denies chest pain or shortness of breath.  He has a weakness. Patient is also followed by cardiologist.  Creat  Date/Time Value Ref Range Status  11/15/2014 04:17 PM 1.79 (H) 0.50 - 1.35 mg/dL Final   Creatinine, Ser  Date/Time Value Ref Range Status  03/10/2018 08:15 AM 3.50 (H) 0.61 - 1.24 mg/dL Final  03/09/2018 03:29 PM 3.52 (H) 0.61 - 1.24 mg/dL Final  03/09/2018 05:54 AM 3.48 (H) 0.61 - 1.24 mg/dL Final  03/08/2018 03:52 PM 3.45 (H) 0.61 - 1.24 mg/dL Final  10/26/2017 03:07 PM 2.71 (H) 0.76 - 1.27 mg/dL Final  10/14/2017 12:09 PM 2.72 (H) 0.76 - 1.27 mg/dL Final  04/13/2017 11:34 AM 2.34 (H) 0.61 - 1.24 mg/dL Final  03/29/2017 03:58 PM 2.34 (H) 0.76 - 1.27 mg/dL Final  08/22/2013 11:41 AM 2.2 (H) 0.4 - 1.5 mg/dL Final  10/31/2009 04:42 AM 1.67 (H) 0.4 - 1.5 mg/dL Final  10/30/2009 04:10 AM 1.88 (H) 0.4 - 1.5 mg/dL Final   10/29/2009 05:50 AM 1.80 (H) 0.4 - 1.5 mg/dL Final  10/28/2009 04:35 AM 1.81 (H) 0.4 - 1.5 mg/dL Final  10/27/2009 04:30 AM 1.64 (H) 0.4 - 1.5 mg/dL Final  10/26/2009 03:35 AM 1.88 (H) 0.4 - 1.5 mg/dL Final  10/25/2009 04:05 AM 1.66 (H) 0.4 - 1.5 mg/dL Final  10/24/2009 03:30 AM 2.04 (H) 0.4 - 1.5 mg/dL Final  10/23/2009 03:18 AM 2.14 (H) 0.4 - 1.5 mg/dL Final  10/22/2009 04:15 AM 1.94 (H) 0.4 - 1.5 mg/dL Final  10/21/2009 04:30 AM 2.08 (H) 0.4 - 1.5 mg/dL Final  10/20/2009 03:18 AM 2.02 (H) 0.4 - 1.5 mg/dL Final  10/19/2009 06:35 AM 1.95 (H) 0.4 - 1.5 mg/dL Final  10/18/2009 03:30 AM 2.04 (H) 0.4 - 1.5 mg/dL Final  10/17/2009 04:55 AM 1.98 (H) 0.4 - 1.5 mg/dL Final  10/16/2009 03:35 AM 1.91 (H) 0.4 - 1.5 mg/dL Final  10/15/2009 07:54 PM 1.86 (H) 0.4 - 1.5 mg/dL Final  10/15/2009 03:37 PM 1.94 (H) 0.4 - 1.5 mg/dL Final  10/15/2009 12:17 AM 2.02 (H) 0.4 - 1.5 mg/dL Final  10/14/2009 05:48 PM 2.12 (H) 0.4 - 1.5 mg/dL Final  09/27/2007 12:00 PM 1.55 (H)  Final  09/25/2007 10:47 AM 1.59 (H)  Final     PMHx:   Past Medical History:  Diagnosis Date  . Acute renal failure (ARF) ([place])   . AICD (automatic cardioverter/defibrillator) present 04/13/2017   St Jude  . AKI (acute kidney injury) ([place])  03/08/2018  . Arthritis    "mostly hands & fingers" (03/08/2018)  . Atrial fibrillation ([place])   . CHF (congestive heart failure) ([place])   . Chronic systolic heart failure ([place])   . Falls frequently    "has been falling several times lately" (03/08/2018)  . History of blood transfusion    "for his heart" (03/08/2018)  . History of gout   . History of kidney stones   . Hyperlipidemia   . Hypertension   . Pneumonia    "twice, I think" (03/08/2018)    Past Surgical History:  Procedure Laterality Date  . BIV ICD GENERATOR CHANGEOUT N/A 04/13/2017   Procedure: BiV ICD Generator Changeout;  Surgeon: Evans Lance, MD;  Location: [facility];  Service: Cardiovascular;  Laterality: N/A;   . CARDIAC DEFIBRILLATOR PLACEMENT  10/2009   St. Jude biventricular ICD   . FLUOROSCOPY GUIDANCE N/A 04/27/2017   Procedure: FLUOROSCOPY GUIDANCE;  Surgeon: Deboraha Sprang, MD;  Location: [facility];  Service: Cardiovascular;  Laterality: N/A;  . LITHOTRIPSY    . POLYPECTOMY     pt does not recall this procedure on 03/08/2018    Family Hx:  Family History  Problem Relation Age of Onset  . Heart failure Mother   . Hypertension Mother   . Heart failure Father   . Hypertension Father   . Other Unknown        notable for CHF    Social History:  reports that he has never smoked. He has never used smokeless tobacco. He reports that he drank alcohol. He reports that he does not use drugs.  Allergies: No Known Allergies  Medications: Prior to Admission medications   Medication Sig Start Date End Date Taking? Authorizing Provider  acetaminophen (TYLENOL) 325 MG tablet Take 2 tablets (650 mg total) by mouth every 6 (six) hours as needed. 07/12/17  Yes Evans Lance, MD  atorvastatin (LIPITOR) 40 MG tablet TAKE 1 TABLET EVERY DAY  AT  6PM 07/14/17  Yes Belva Crome, MD  carvedilol (COREG) 6.25 MG tablet Take 0.5 tablets (3.125 mg total) by mouth 2 (two) times daily with a meal. 01/19/18  Yes Belva Crome, MD  furosemide (LASIX) 40 MG tablet Take 0.5 tablets (20 mg total) by mouth daily. 01/17/18  Yes Belva Crome, MD  triamcinolone cream (KENALOG) 0.1 % Apply 1 application topically daily.  02/13/15  Yes [provider]  warfarin (COUMADIN) 3 MG tablet Take 1 or 1.5 tablets daily as directed by Coumadin clinic Patient taking differently: Take 3-4.5 mg by mouth See admin instructions. Take 28m one day and 4.565mthe next. Alternate every day 01/19/18  Yes SmBelva CromeMD    I have reviewed the patient's current medications.  Labs:  Results for orders placed or performed during the hospital encounter of 03/08/18 (from the past 48 hour(s))  CBG monitoring, ED     Status:  Abnormal   Collection Time: 03/08/18  3:24 PM  Result Value Ref Range   Glucose-Capillary 105 (H) 70 - 99 mg/dL  Comprehensive metabolic panel     Status: Abnormal   Collection Time: 03/08/18  3:52 PM  Result Value Ref Range   Sodium 141 135 - 145 mmol/L   Potassium 5.7 (H) 3.5 - 5.1 mmol/L   Chloride 121 (H) 98 - 111 mmol/L    Comment: Please note change in reference range.   CO2 14 (L) 22 - 32 mmol/L   Glucose,  Bld 111 (H) 70 - 99 mg/dL    Comment: Please note change in reference range.   BUN 53 (H) 8 - 23 mg/dL    Comment: Please note change in reference range.   Creatinine, Ser 3.45 (H) 0.61 - 1.24 mg/dL   Calcium 8.3 (L) 8.9 - 10.3 mg/dL   Total Protein 6.5 6.5 - 8.1 g/dL   Albumin 2.6 (L) 3.5 - 5.0 g/dL   AST 30 15 - 41 U/L   ALT 22 0 - 44 U/L    Comment: Please note change in reference range.   Alkaline Phosphatase 28 (L) 38 - 126 U/L   Total Bilirubin 1.7 (H) 0.3 - 1.2 mg/dL   GFR calc non Af Amer 15 (L) >60 mL/min   GFR calc Af Amer 17 (L) >60 mL/min    Comment: (NOTE) The eGFR has been calculated using the CKD EPI equation. This calculation has not been validated in all clinical situations. eGFR's persistently <60 mL/min signify possible Chronic Kidney Disease.    Anion gap 6 5 - 15    Comment: Performed at [facility] [address], [place], [place] [address]  Ethanol     Status: None   Collection Time: 03/08/18  3:52 PM  Result Value Ref Range   Alcohol, Ethyl (B) <10 <10 mg/dL    Comment: (NOTE) Lowest detectable limit for serum alcohol is 10 mg/dL. For medical purposes only. Performed at [facility], [place] [address], [place], [place] [address]   Brain natriuretic peptide     Status: Abnormal   Collection Time: 03/08/18  3:52 PM  Result Value Ref Range   B Natriuretic Peptide 2,285.4 (H) 0.0 - 100.0 pg/mL    Comment: Performed at [facility] [address], [place], [place] [address]  Troponin I     Status: Abnormal   Collection  Time: 03/08/18  3:52 PM  Result Value Ref Range   Troponin I 0.06 (HH) <0.03 ng/mL    Comment: CRITICAL RESULT CALLED TO, READ BACK BY AND VERIFIED WITH: Isaiah Blakes 1654 03/08/2018 WBOND Performed at [facility], [place] [address], [place], [place] [address]   CBC with Differential     Status: Abnormal   Collection Time: 03/08/18  3:52 PM  Result Value Ref Range   WBC 5.4 4.0 - 10.5 K/uL   RBC 2.71 (L) 4.22 - 5.81 MIL/uL   Hemoglobin 8.7 (L) 13.0 - 17.0 g/dL   HCT 28.8 (L) 39.0 - 52.0 %   MCV 106.3 (H) 78.0 - 100.0 fL   MCH 32.1 26.0 - 34.0 pg   MCHC 30.2 30.0 - 36.0 g/dL   RDW 15.5 11.5 - 15.5 %   Platelets 100 (L) 150 - 400 K/uL    Comment: REPEATED TO VERIFY SPECIMEN CHECKED FOR CLOTS PLATELET COUNT CONFIRMED BY SMEAR    Neutrophils Relative % 70 %   Neutro Abs 3.7 1.7 - 7.7 K/uL   Lymphocytes Relative 11 %   Lymphs Abs 0.6 (L) 0.7 - 4.0 K/uL   Monocytes Relative 16 %   Monocytes Absolute 0.8 0.1 - 1.0 K/uL   Eosinophils Relative 3 %   Eosinophils Absolute 0.2 0.0 - 0.7 K/uL   Basophils Relative 0 %   Basophils Absolute 0.0 0.0 - 0.1 K/uL   Immature Granulocytes 0 %   Abs Immature Granulocytes 0.0 0.0 - 0.1 K/uL    Comment: Performed at [facility], [place] [address],  [place], [place] [address]  Protime-INR     Status: Abnormal   Collection Time: 03/08/18  3:52 PM  Result Value Ref Range   Prothrombin Time 36.3 (H) 11.4 - 15.2 seconds   INR 3.69     Comment: Performed at [facility] [address], [place], [place] [address]  Urinalysis, Routine w reflex microscopic     Status: Abnormal   Collection Time: 03/08/18  4:28 PM  Result Value Ref Range   Color, Urine YELLOW YELLOW   APPearance CLOUDY (A) CLEAR   Specific Gravity, Urine 1.012 1.005 - 1.030   pH 5.0 5.0 - 8.0   Glucose, UA NEGATIVE NEGATIVE mg/dL   Hgb urine dipstick MODERATE (A) NEGATIVE   Bilirubin Urine NEGATIVE NEGATIVE   Ketones, ur NEGATIVE NEGATIVE mg/dL   Protein, ur 100 (A)  NEGATIVE mg/dL   Nitrite NEGATIVE NEGATIVE   Leukocytes, UA LARGE (A) NEGATIVE   RBC / HPF 6-10 0 - 5 RBC/hpf   WBC, UA >50 (H) 0 - 5 WBC/hpf   Bacteria, UA FEW (A) NONE SEEN   Squamous Epithelial / LPF 0-5 0 - 5   WBC Clumps PRESENT    Mucus PRESENT     Comment: Performed at [facility], [place] [address] [place], [place] [address]  POC occult blood, ED     Status: None   Collection Time: 03/08/18  4:51 PM  Result Value Ref Range   Fecal Occult Bld NEGATIVE NEGATIVE  Urine culture     Status: Abnormal   Collection Time: 03/08/18  5:18 PM  Result Value Ref Range   Specimen Description URINE, CLEAN CATCH    Special Requests      NONE Performed at [facility], [place] [address], [place], [place] [address]    Culture MULTIPLE SPECIES PRESENT, SUGGEST RECOLLECTION (A)    Report Status 03/09/2018 FINAL   Troponin I     Status: Abnormal   Collection Time: 03/08/18 11:50 PM  Result Value Ref Range   Troponin I 0.06 (HH) <0.03 ng/mL    Comment: CRITICAL VALUE NOTED.  VALUE IS CONSISTENT WITH PREVIOUSLY REPORTED AND CALLED VALUE. Performed at [facility], [place] [address], [place], [place] [address]   Protime-INR     Status: Abnormal   Collection Time: 03/09/18  5:54 AM  Result Value Ref Range   Prothrombin Time 37.4 (H) 11.4 - 15.2 seconds   INR 3.83     Comment: Performed at [facility] [address] [place], [place] [address]  CBC     Status: Abnormal   Collection Time: 03/09/18  5:54 AM  Result Value Ref Range   WBC 5.4 4.0 - 10.5 K/uL   RBC 2.69 (L) 4.22 - 5.81 MIL/uL   Hemoglobin 8.7 (L) 13.0 - 17.0 g/dL   HCT 29.2 (L) 39.0 - 52.0 %   MCV 108.6 (H) 78.0 - 100.0 fL   MCH 32.3 26.0 - 34.0 pg   MCHC 29.8 (L) 30.0 - 36.0 g/dL   RDW 15.7 (H) 11.5 - 15.5 %   Platelets 95 (L) 150 - 400 K/uL    Comment: REPEATED TO VERIFY CONSISTENT WITH PREVIOUS RESULT Performed at [facility], [place] [address] [place], [place] [address]   Basic metabolic panel      Status: Abnormal   Collection Time: 03/09/18  5:54 AM  Result Value Ref Range   Sodium 144 135 - 145 mmol/L   Potassium 5.8 (  H) 3.5 - 5.1 mmol/L   Chloride 122 (H) 98 - 111 mmol/L    Comment: Please note change in reference range.   CO2 13 (L) 22 - 32 mmol/L   Glucose, Bld 98 70 - 99 mg/dL    Comment: Please note change in reference range.   BUN 54 (H) 8 - 23 mg/dL    Comment: Please note change in reference range.   Creatinine, Ser 3.48 (H) 0.61 - 1.24 mg/dL   Calcium 8.3 (L) 8.9 - 10.3 mg/dL   GFR calc non Af Amer 15 (L) >60 mL/min   GFR calc Af Amer 17 (L) >60 mL/min    Comment: (NOTE) The eGFR has been calculated using the CKD EPI equation. This calculation has not been validated in all clinical situations. eGFR's persistently <60 mL/min signify possible Chronic Kidney Disease.    Anion gap 9 5 - 15    Comment: Performed at [facility] [address], [place], [place] [address]  Troponin I     Status: Abnormal   Collection Time: 03/09/18  5:54 AM  Result Value Ref Range   Troponin I 0.05 (HH) <0.03 ng/mL    Comment: CRITICAL VALUE NOTED.  VALUE IS CONSISTENT WITH PREVIOUSLY REPORTED AND CALLED VALUE. Performed at [facility], [place] [address], [place], [place] [address]   Basic metabolic panel     Status: Abnormal   Collection Time: 03/09/18  3:29 PM  Result Value Ref Range   Sodium 143 135 - 145 mmol/L   Potassium 5.4 (H) 3.5 - 5.1 mmol/L   Chloride 121 (H) 98 - 111 mmol/L    Comment: Please note change in reference range.   CO2 16 (L) 22 - 32 mmol/L   Glucose, Bld 124 (H) 70 - 99 mg/dL    Comment: Please note change in reference range.   BUN 55 (H) 8 - 23 mg/dL    Comment: Please note change in reference range.   Creatinine, Ser 3.52 (H) 0.61 - 1.24 mg/dL   Calcium 8.3 (L) 8.9 - 10.3 mg/dL   GFR calc non Af Amer 15 (L) >60 mL/min   GFR calc Af Amer 17 (L) >60 mL/min    Comment: (NOTE) The eGFR has been calculated using the CKD EPI equation. This  calculation has not been validated in all clinical situations. eGFR's persistently <60 mL/min signify possible Chronic Kidney Disease.    Anion gap 6 5 - 15    Comment: Performed at [facility] [address], [place], [place] [address]  Protime-INR     Status: Abnormal   Collection Time: 03/10/18  7:04 AM  Result Value Ref Range   Prothrombin Time 36.6 (H) 11.4 - 15.2 seconds   INR 3.73     Comment: Performed at [facility] [address] [place], [place] [address]  Basic metabolic panel     Status: Abnormal   Collection Time: 03/10/18  8:15 AM  Result Value Ref Range   Sodium 142 135 - 145 mmol/L   Potassium 5.2 (H) 3.5 - 5.1 mmol/L   Chloride 118 (H) 98 - 111 mmol/L    Comment: Please note change in reference range.   CO2 16 (L) 22 - 32 mmol/L   Glucose, Bld 105 (H) 70 - 99 mg/dL    Comment: Please note change in reference range.   BUN 58 (H) 8 - 23 mg/dL    Comment: Please note change in reference range.  Creatinine, Ser 3.50 (H) 0.61 - 1.24 mg/dL   Calcium 8.2 (L) 8.9 - 10.3 mg/dL   GFR calc non Af Amer 15 (L) >60 mL/min   GFR calc Af Amer 17 (L) >60 mL/min    Comment: (NOTE) The eGFR has been calculated using the CKD EPI equation. This calculation has not been validated in all clinical situations. eGFR's persistently <60 mL/min signify possible Chronic Kidney Disease.    Anion gap 8 5 - 15    Comment: Performed at [facility] [address] [place], [place] [address]     ROS:  Pertinent items noted in HPI and remainder of comprehensive ROS otherwise negative.  Physical Exam: Vitals:   03/10/18 0814 03/10/18 1130  BP: 112/76 112/85  Pulse: 65 61  Resp: 18 18  Temp: 99 F (37.2 C) (!) 97.5 F (36.4 C)  SpO2: 100% 100%     General exam: Appears calm and comfortable  Respiratory system: Clear to auscultation. Respiratory effort normal. No wheezing or crackle Cardiovascular system: S1 & S2 heard, RRR.  B/l LE pitting  edema. Gastrointestinal system: Abdomen is distended, soft and nontender. Normal bowel sounds heard. Central nervous system: Alert, awake. No focal neurological deficits. Extremities: Symmetric 5 x 5 power. Skin: No rashes, lesions or ulcers Psychiatry: Judgement and insight appear normal. Mood & affect appropriate.   Assessment/Plan:   #Acute on chronic kidney disease stage IV likely cardiorenal syndrome and probably not functioning right kidney: The baseline creatinine around 2.7 now elevated to 3.5.  Patient has fluid overload on physical exam.  Agree with Lasix 80 IV twice a day.  Ordered external urinary catheter for strict ins and out.  Bladder scan. -UA with UTI.  I will quantify proteinuria.  Check urine sodium and potassium. -Right kidney is probably not functioning and left kidney has renal cortex thinness and increased echogenicity. -Start sodium bicarbonate for acidosis. -Monitor urine output, BMP.  Avoid nephrotoxins.  Given poor cardiac function and other co-morbidities patient may not tolerate dialysis.  I recommend palliative care consult evaluation.  #Moderate right hydroureteronephrosis with multiple obstructing stones: Per primary team Dr Bonner Puna, it was discussed with the urologist (Dr. Karsten Ro).  Patient had lithotripsy in the past and found to have a recurrent right renal stone and patient declined treatment in 2013.  I recommend to follow-up with urologist for further plan and follow-up imaging.  #Hyperkalemia due to renal failure: Not on potassium supplement.  Received [place]. Serum potassium level of 5.2 today.  Continue to monitor.  Lasix should help to excrete potassium.  Check magnesium level as well.  #Systolic congestive heart failure with EF of 20 %, NICM, Afib: Cardiology is following.  D/w primary team. Thank you for the consult.  We will follow with you.  Brevyn Ring Tanna Furry 03/10/2018, 12:48 PM  [facility].

## 2018-03-10 NOTE — Progress Notes (Addendum)
Patient is alert and oriented from home and Wife is a retired Engineer, civil (consulting), with home health and wound care, v/s stable Diuresing and monitoring kidney stones.plan for poss Rehab.

## 2018-03-10 NOTE — Consult Note (Signed)
[facility] CM Primary Care Navigator  03/10/2018  Tyler Gray 26-Feb-1933 [id number]   Met with patient at the bedside to identify possible discharge needs.  Patient reports "leg swelling- could hardly walk, weakness and fall that all led to this admission. (Paroxysmal atrial fibrillation, systolic heart failure, acute lower UTI)  PatientendorsesDr.Veita Criss Rosales with [facility] primary care provider.   [facility] and [facility] Order delivery service to obtain medications without difficulty.  Patient states that both his daughters (Tyler Gray and Tyler Gray) have beenmanaging hismedications at [employer]" systemfilledonce a week.  Per patient, his oldest daughter Tyler Gray) [place] Olp providing transportation to hisdoctors' appointments.  Made aware of [employer] transportation benefits.  Patient verbalized that wife Tyler Gray) and his daughtersare his primary caregivers at home. Per chart review, patient has [facility] for home health services prior to admission.  Anticipated discharge plan is skilled nursing facility (SNF) for rehabilitation per therapy recommendation, once he is medically stable.  Patient voiced understanding to call primary care provider's officewhenhe returns backhome,for a post discharge follow-upvisitwithin1- 2weeksor sooner if needs arise.Patient letter (with PCP's contact number) was provided asareminder.  Explained to patient about [facility] CM services available for healthmanagementand resourcesat home, but unsure of what he will be needing after rehab facility discharge. Patient has knowledge in managing HF such as monitoring weight and recording, avoiding salt in diet, adherence to medications and close follow-up with providers. He states will need to talk to primary care provider on his next visit to get his advise.  Patient states understandingof needto seekreferral  from primary care provider to [facility] care management ifdeemed necessary and appropriatefor anyservicesin the nearfuture- once dischargedbackhome.   [facility] care management information provided for future needs thatpatient may have.     For additional questions please contact:  Edwena Felty A. Isadore Bokhari, BSN, RN-BC [facility] PRIMARY CARE Navigator Cell: [phone]

## 2018-03-11 LAB — RENAL FUNCTION PANEL
ALBUMIN: 2.5 g/dL — AB (ref 3.5–5.0)
Anion gap: 12 (ref 5–15)
BUN: 65 mg/dL — ABNORMAL HIGH (ref 8–23)
CHLORIDE: 113 mmol/L — AB (ref 98–111)
CO2: 16 mmol/L — AB (ref 22–32)
Calcium: 8.3 mg/dL — ABNORMAL LOW (ref 8.9–10.3)
Creatinine, Ser: 3.36 mg/dL — ABNORMAL HIGH (ref 0.61–1.24)
GFR calc Af Amer: 18 mL/min — ABNORMAL LOW (ref >60)
GFR, EST NON AFRICAN AMERICAN: 16 mL/min — AB (ref >60)
Glucose, Bld: 106 mg/dL — ABNORMAL HIGH (ref 70–99)
PHOSPHORUS: 3.9 mg/dL (ref 2.5–4.6)
POTASSIUM: 4.9 mmol/L (ref 3.5–5.1)
Sodium: 141 mmol/L (ref 135–145)

## 2018-03-11 LAB — PROTIME-INR
INR: 3.3
PROTHROMBIN TIME: 33.3 s — AB (ref 11.4–15.2)

## 2018-03-11 LAB — PROTEIN / CREATININE RATIO, URINE
Creatinine, Urine: 27.42 mg/dL
PROTEIN CREATININE RATIO: 0.91 mg/mg{creat} — AB (ref 0.00–0.15)
Total Protein, Urine: 25 mg/dL

## 2018-03-11 LAB — NA AND K (SODIUM & POTASSIUM), RAND UR
Potassium Urine: 24 mmol/L
Sodium, Ur: 106 mmol/L

## 2018-03-11 NOTE — Progress Notes (Signed)
PROGRESS NOTE  Tyler Gray  RUE:[id number] DOB: 09-25-1932 DOA: 03/08/2018 PCP: Renaye Rakers, MD  Outpatient Specialists: Cardiology, Dr. Verdis Prime Brief Narrative: Tyler Gray is an 84 y.o. male with a history of NICM, chronic systolic CHF s/p BiV pacemaker, PAF on coumadin, stage III CKD who presented with increasing weakness, lower extremity edema and shortness of breath. Pt is difficult historian but appeared volume overloaded. Afebrile, hemodynamically stable. UA with large leuks, > 50 WBC, few bacteria. CMP with K of 5.7 and Cr 3.45 ( baseline 2.3-2.7). BNP > 2200. Troponin slightly elevated at 0.06. Hgb 8.7 down from baseline of 10 otherwise no acute changes on CBC. INR elevated over goal at 3.69. CXR with mild bibasilar atelectasis with stable cardiomegaly and no pulmonary edema. XR of hand with no acute osseous abnormalities, and soft tissue swelling near right 5th MCP joint. CT head/Cervical spine with no acute intracranial abnormalities with cerebral volume loss and chronic small vessel ischemic changes and severe degenerative changes in cervical spine  Lasix was started and cardiology consulted. Lasix has been increased and beta blocker stopped by cardiology. Renal function worsened and renal ultrasound demonstrated right hydronephrosis. CT confirmed presence of obstructing ureteral and nonobstructing bladder stones which, per discussion with urology, have been there since 2013. Nephrology consulted Remains volume overloaded with echocardiogram showing EF 20-25%.   Assessment & Plan: Active Problems:   Paroxysmal atrial fibrillation (HCC)   CHRONIC SYSTOLIC HEART FAILURE   Chronic kidney disease, stage IV (severe) (HCC)   Automatic implantable cardioverter-defibrillator in situ   AKI (acute kidney injury) (HCC)   Acute lower UTI   Acute systolic CHF (congestive heart failure) (HCC)   Elevated INR   Hyperkalemia   Elevated troponin   Malnutrition of moderate degree  Pressure injury of skin  Generalized weakness: Multifactorial due to heart failure, possibly also UTI and deconditioning.  - Treat as below - Will benefit from short term SNF.   AKI on stage III CKD: Decreased perfusion from depleted EABV in addition to chronic renal disease and essentially solitary kidney with chronic right ureteral obstruction. Renal U/S revealed a right kidney that is thinned with moderate hydronephrosis and increased echogenicity. No right ureteral jet was seen. Left kidney is 14.7cm with cortical thinning and echogenicity.  - Monitor closely with diuresis.  - Appreciate nephrology recommendations. Discussed with family that the patient would likely not be a good hemodialysis candidate.     Hyperkalemia: Resolved - Monitor on IV lasix.  - Continue telemetry, no ECG changes.   Metabolic acidosis: Due to renal failure - continue bicarbonate.   Acute on chronic systolic CHF: s/p ICD.  - lasix 80mg  IV BID, UOP picking up and Creatinine modestly improved today. - Monitor strict I/O (need to have accurate data), daily weights, BMP - Sodium restriction - No ACE/ARB/entresto with renal insufficiency/hyperK. Holding beta blocker with need for diuresis. May need milrinone.  - Echo showed EF 20-25% with akinesis of inferoseptal wall, severe biatrial dilatation with mod-severe MR and severe TR, elevated pulmonary artery pressure.   Ureterolithiasis with right hydronephrosis: Known previously and confirmed on CT stone protocol 6/27. Patient and his wife confirm that it was known years ago and the risks of treatment with his cardiac function were felt to prohibit treatment. - No further intervention planned per discussion with urology, Dr. Vernie Ammons, 6/27.   Acute lower UTI: UA consistent with infection, culture polyclonal. Stones present, though pt not septic.  - Ceftriaxone x3 days and monitor for urinary  symptoms.  Atrial fibrillation, rate controlled:  - Continue telemetry -  Holding home coumadin due to elevated INR (remains elevated, checking daily)  Elevated troponin. Likely demand ischemia. EKG reviewed and shows no acute ischemic changes. Trend troponin q 6 H. F/u TTE.   Supratherapeutic INR: 3.6 on admission.  - Hold coumadin - Daily INR - No indication for reversal.  Irregular liver contour on imaging: Compatible with cirrhosis, though he doesn't carry this diagnosis. Would be compatible with thrombocytopenia, hypoalbuminemia. Appears compensated with normal AST/ALT, no ascites or encephalopathy. INR elevated due to coumadin. No ascites noted.  - Will defer further work up for now.    DVT prophylaxis: Coumadin Code Status: Full Family Communication: None at bedside Disposition Plan: SNF recommended by therapy. Will need to continue monitoring patient's clinical course for definite decisions. Given poor prognosis, I have begun palliative care discussions. Plan to consult them once renal and cardiac function declare themselves.  Consultants:   Cardiology, Dr. Jens Som  Nephrology, Dr. Ronalee Belts  Procedures:   Echocardiogram 03/09/2018:  - Left ventricle: The cavity size was severely dilated. Systolic   function was severely reduced. The estimated ejection fraction   was in the range of 20% to 25%. Diffuse hypokinesis. There is   akinesis of the inferoseptal myocardium. The study is not   technically sufficient to allow evaluation of LV diastolic   function. - Aortic valve: There was mild regurgitation. - Aorta: Aortic root dimension: 43 mm (ED). - Ascending aorta: The ascending aorta was mildly dilated. - Mitral valve: Mildly thickened leaflets . There was moderate to   severe regurgitation directed posteriorly. - Left atrium: The atrium was severely dilated. - Right ventricle: The cavity size was moderately dilated. Wall   thickness was normal. Pacer wire or catheter noted in right   ventricle. - Right atrium: The atrium was severely  dilated. - Tricuspid valve: There was severe regurgitation. - Pulmonary arteries: Systolic pressure was moderately increased.   PA peak pressure: 50 mm Hg (S).  Antimicrobials:  Ceftriaxone 6/26 >>    Subjective: Urination improved but not improving swelling thus far. Denies dyspnea or chest pain.   Objective: Vitals:   03/10/18 1709 03/10/18 2024 03/11/18 0427 03/11/18 1115  BP: 102/74 110/80 115/84 105/72  Pulse: 67 64 77 63  Resp: 20  18 12   Temp: (!) 97.5 F (36.4 C) 98 F (36.7 C) (!) 97.5 F (36.4 C) 97.6 F (36.4 C)  TempSrc: Oral Oral Oral Oral  SpO2: 100% 100% 100% 100%  Weight:   77.3 kg (170 lb 8 oz)   Height:        Intake/Output Summary (Last 24 hours) at 03/11/2018 1629 Last data filed at 03/11/2018 1100 Gross per 24 hour  Intake 1050 ml  Output 975 ml  Net 75 ml   Filed Weights   03/09/18 0500 03/10/18 0544 03/11/18 0427  Weight: 78.4 kg (172 lb 14.4 oz) 77.2 kg (170 lb 3.2 oz) 77.3 kg (170 lb 8 oz)   Gen: Elderly male in no distress Pulm: Nonlabored breathing room air. Clear. CV: Irreg, no murmur, rub, or gallop. No JVD, 2+ pitting dependent edema to the knees. GI: Abdomen soft, non-tender, non-distended, with normoactive bowel sounds.  Ext: Warm, no deformities Skin: Right shin abrasion with soft eschar, covered without purulence or surrounding erythema.  Neuro: Alert and oriented. No focal neurological deficits. Psych: Judgement and insight appear fair. Mood euthymic & affect congruent. Behavior is appropriate.    Data Reviewed: I have  personally reviewed following labs and imaging studies  CBC: Recent Labs  Lab 03/08/18 1552 03/09/18 0554  WBC 5.4 5.4  NEUTROABS 3.7  --   HGB 8.7* 8.7*  HCT 28.8* 29.2*  MCV 106.3* 108.6*  PLT 100* 95*   Basic Metabolic Panel: Recent Labs  Lab 03/08/18 1552 03/09/18 0554 03/09/18 1529 03/10/18 0815 03/11/18 0833  NA 141 144 143 142 141  K 5.7* 5.8* 5.4* 5.2* 4.9  CL 121* 122* 121* 118* 113*    CO2 14* 13* 16* 16* 16*  GLUCOSE 111* 98 124* 105* 106*  BUN 53* 54* 55* 58* 65*  CREATININE 3.45* 3.48* 3.52* 3.50* 3.36*  CALCIUM 8.3* 8.3* 8.3* 8.2* 8.3*  PHOS  --   --   --   --  3.9   GFR: Estimated Creatinine Clearance: 16.1 mL/min (A) (by C-G formula based on SCr of 3.36 mg/dL (H)). Liver Function Tests: Recent Labs  Lab 03/08/18 1552 03/11/18 0833  AST 30  --   ALT 22  --   ALKPHOS 28*  --   BILITOT 1.7*  --   PROT 6.5  --   ALBUMIN 2.6* 2.5*   No results for input(s): LIPASE, AMYLASE in the last 168 hours. No results for input(s): AMMONIA in the last 168 hours. Coagulation Profile: Recent Labs  Lab 03/08/18 1552 03/09/18 0554 03/10/18 0704 03/11/18 0444  INR 3.69 3.83 3.73 3.30   Cardiac Enzymes: Recent Labs  Lab 03/08/18 1552 03/08/18 2350 03/09/18 0554  TROPONINI 0.06* 0.06* 0.05*   BNP (last 3 results) Recent Labs    10/14/17 1209  PROBNP 15,115*   HbA1C: No results for input(s): HGBA1C in the last 72 hours. CBG: Recent Labs  Lab 03/08/18 1524  GLUCAP 105*   Lipid Profile: No results for input(s): CHOL, HDL, LDLCALC, TRIG, CHOLHDL, LDLDIRECT in the last 72 hours. Thyroid Function Tests: No results for input(s): TSH, T4TOTAL, FREET4, T3FREE, THYROIDAB in the last 72 hours. Anemia Panel: No results for input(s): VITAMINB12, FOLATE, FERRITIN, TIBC, IRON, RETICCTPCT in the last 72 hours. Urine analysis:    Component Value Date/Time   COLORURINE YELLOW 03/08/2018 1628   APPEARANCEUR CLOUDY (A) 03/08/2018 1628   LABSPEC 1.012 03/08/2018 1628   PHURINE 5.0 03/08/2018 1628   GLUCOSEU NEGATIVE 03/08/2018 1628   HGBUR MODERATE (A) 03/08/2018 1628   BILIRUBINUR NEGATIVE 03/08/2018 1628   KETONESUR NEGATIVE 03/08/2018 1628   PROTEINUR 100 (A) 03/08/2018 1628   NITRITE NEGATIVE 03/08/2018 1628   LEUKOCYTESUR LARGE (A) 03/08/2018 1628   Recent Results (from the past 240 hour(s))  Urine culture     Status: Abnormal   Collection Time:  03/08/18  5:18 PM  Result Value Ref Range Status   Specimen Description URINE, CLEAN CATCH  Final   Special Requests   Final    NONE Performed at [facility], [address], [place], [place] [address]    Culture MULTIPLE SPECIES PRESENT, SUGGEST RECOLLECTION (A)  Final   Report Status 03/09/2018 FINAL  Final      Radiology Studies: US Renal  Result Date: 03/09/2018 CLINICAL DATA:  Acute renal injury EXAM: RENAL / URINARY TRACT ULTRASOUND COMPLETE COMPARISON:  CT 12/16/2011 FINDINGS: Right Kidney: Length: 12.9 cm. Thinning of the renal cortex is noted with mild-to-moderate hydronephrosis noted. Slight increase in cortical echogenicity. Several hypoechoic lesions of the liver are identified, the largest appearing anechoic and representing simple cysts measuring 2.4 x 2.2 x 2.7 cm arising off the lateral interpolar aspect and 1.9  x 1.5 x 1.9 cm arising off the lower pole. Several smaller indeterminate hypoechoic lesions are noted more likely to represent small complex cysts with low-level internal echoes noted. No nephrolithiasis is identified. No definite solid vascular masses are identified. Left Kidney: Length: 14.7 cm. Thinning of the renal cortex is also noted on the left. There is slight increase in cortical echogenicity as well. A dominant cyst measuring 6 x 5.5 x 5 cm is identified arising off the upper pole without worrisome features. Low-level internal echoes may represent debris or acoustic artifacts. No solid vascular lesions are identified. No hydronephrosis. Bladder: Appears normal for degree of bladder distention. Only a left ureteral jet could be documented. IMPRESSION: Increased echogenicity of both kidneys with cortical thinning and bilateral renal cysts some of which appear complex with low-level internal echoes. Increased echogenicity may reflect a component of medical renal disease. Mild-to-moderate right-sided hydronephrosis is noted and there was no demonstrable ureteral jet  seen in the bladder to suggest patency of the right ureter. An obstructing calculus, stenosis of the ureter or ureteral lesion is not entirely excluded. Electronically Signed   By: Tollie Eth M.D.   On: 03/09/2018 17:33   Ct Renal Stone Study  Result Date: 03/10/2018 CLINICAL DATA:  Inpatient. Urinary tract stone, symptomatic. Right hydronephrosis on sonogram. EXAM: CT ABDOMEN AND PELVIS WITHOUT CONTRAST TECHNIQUE: Multidetector CT imaging of the abdomen and pelvis was performed following the standard protocol without IV contrast. COMPARISON:  03/09/2018 renal sonogram. 12/16/2011 CT abdomen/pelvis. FINDINGS: Lower chest: Cardiomegaly. ICD lead is seen in the right ventricle. Coronary atherosclerosis. Small dependent right pleural effusion. Bronchiectasis with associated 4.8 x 3.4 cm focus of masslike consolidation in the medial basilar left lower lobe (series 4/image 15), previously 4.9 x 3.7 cm on 12/16/2011 CT, not appreciably changed. Hepatobiliary: Diffusely irregular liver surface, compatible with hepatic cirrhosis. No liver mass. Contracted gallbladder with subcentimeter calcified gallstone. No definite gallbladder wall thickening. No biliary ductal dilatation. Pancreas: Normal, with no mass or duct dilation. Spleen: Normal size. No mass. Adrenals/Urinary Tract: Normal adrenals. Obstructing adjacent 11 mm and 6 mm stones in the proximal pelvic segment of the right ureter with moderate right hydroureteronephrosis. Nonobstructing 8 mm stone in the right renal pelvis. Nonobstructing 14 mm lower right renal stone. Additional 8 mm stone in the distal pelvic segment of the right ureter. Layering 6 mm and 4 mm right bladder stones. No left renal stones. No left hydronephrosis. Normal caliber left ureter, with no left ureteral stones. Severe right renal parenchymal atrophy. Moderate left renal parenchymal atrophy. Simple renal cysts in both kidneys, largest 5.7 cm in the posterior upper left kidney. Multiple  indeterminate hyperdense and hypodense renal lesions in both kidneys, largest 2.1 cm in the anterior lower right kidney (series 3/image 41), which has increased from 1.1 cm on 12/16/2011 CT. Otherwise normal bladder. Stomach/Bowel: Small hiatal hernia. Otherwise normal nondistended stomach. Normal caliber small bowel with no small bowel wall thickening. Normal appendix. Normal large bowel with no diverticulosis, large bowel wall thickening or pericolonic fat stranding. Vascular/Lymphatic: Atherosclerotic nonaneurysmal abdominal aorta. No pathologically enlarged lymph nodes in the abdomen or pelvis. Reproductive: Mildly enlarged prostate. Other: No pneumoperitoneum. Small volume ascites. Moderate anasarca. Musculoskeletal: No aggressive appearing focal osseous lesions. Diffuse patchy sclerosis in the osseous structures, probably renal osteodystrophy. Severe thoracolumbar spondylosis. IMPRESSION: 1. Multiple obstructing stones in the pelvic segment of the right ureter, with moderate right hydroureteronephrosis. Additional nonobstructing right renal and layering right bladder stones. 2. Numerous indeterminate renal lesions in  both kidneys, largest 2.1 cm in the anterior lower right kidney, which has increased in size since 2013, cannot exclude renal cell carcinoma. MRI abdomen without and with IV contrast may be obtained on a short term outpatient basis for further characterization. If the patient cannot receive IV gadolinium contrast due to GFR less than 30, recommend attention on follow-up noncontrast CT or MRI abdomen in 6 months. 3. Cirrhosis. No noncontrast CT evidence of liver mass. Small volume ascites. 4. Small dependent right pleural effusion. Chronic bronchiectasis with associated masslike consolidation in the medial basilar left lower lobe, not appreciably changed since 2013, presumably chronic postinfectious/postinflammatory scarring. 5. Chronic findings include: Aortic Atherosclerosis (ICD10-I70.0).  Cardiomegaly. Coronary atherosclerosis. Small hiatal hernia. Cholelithiasis. Mildly enlarged prostate. Renal osteodystrophy. Electronically Signed   By: Delbert Phenix M.D.   On: 03/10/2018 00:57    Scheduled Meds: . atorvastatin  40 mg Oral q1800  . feeding supplement (ENSURE ENLIVE)  237 mL Oral BID BM  . furosemide  80 mg Intravenous BID  . multivitamin with minerals  1 tablet Oral Daily  . sodium bicarbonate  1,300 mg Oral TID   Continuous Infusions: . cefTRIAXone (ROCEPHIN)  IV 1 g (03/10/18 1954)     LOS: 3 days   Time spent: 25 minutes.  Tyrone Nine, MD [facility] [email or web address] Password [facility] 03/11/2018, 4:29 PM

## 2018-03-11 NOTE — Progress Notes (Signed)
Pt has been resting in bed without c/o. Meds taken without difficulty.  Urine sample obtained per orders of 6/28. Urine forwarded to lab per orders. Pt does not display any signs of discomfort or distress.

## 2018-03-11 NOTE — Progress Notes (Signed)
[facility] NEPHROLOGY PROGRESS NOTE  Assessment/ Plan: Pt is a 84 y.o. yo male with history of A. fib on AC, CHF with biventricular pacemaker, EF 20%, right nephrolithiasis with obstruction, CKD, admitted with generalized weakness, shortness of breath and lower extremity edema.  Consulted for AKI on CKD.  Assessment/Plan:  #Acute on chronic kidney disease stage IV likely cardiorenal syndrome and probably not functioning right kidney: The baseline creatinine around 2.7 now elevated to 3.5.  Patient has fluid overload on physical exam and on lasix IV.   -UA with UTI. F/u urine PCR, Na and K.  -Right kidney is probably not functioning and left kidney has renal cortex thinness and increased echogenicity. -Serum creatinine level trending down to 3.36, urine output of more than 1.5 L in 24 hours.  Continue IV Lasix, sodium bicarbonate oral p.o. -Monitor urine output, BMP.  Avoid nephrotoxins.  Given poor cardiac function and other co-morbidities patient may not tolerate dialysis.  I recommend palliative care consult evaluation.  #Moderate right hydroureteronephrosis with multiple obstructing stones: Per primary team Dr Jarvis Newcomer, it was discussed with the urologist (Dr. Vernie Ammons).  Patient had lithotripsy in the past and found to have a recurrent right renal stone and patient declined treatment in 2013.  I recommend to follow-up with urologist for further plan and follow-up imaging.  #Hyperkalemia due to renal failure: K 4.9, monitor lab. On IV lasix.  #Systolic congestive heart failure with EF of 20 %, NICM, Afib: Cardiology is following. Suggesting palliative care eval.  Subjective: Seen and examined at bedside.  Denies chest pain, shortness of breath, nausea vomiting. Objective Vital signs in last 24 hours: Vitals:   03/10/18 1130 03/10/18 1709 03/10/18 2024 03/11/18 0427  BP: 112/85 102/74 110/80 115/84  Pulse: 61 67 64 77  Resp: 18 20  18   Temp: (!) 97.5 F (36.4 C) (!) 97.5  F (36.4 C) 98 F (36.7 C) (!) 97.5 F (36.4 C)  TempSrc: Oral Oral Oral Oral  SpO2: 100% 100% 100% 100%  Weight:    77.3 kg (170 lb 8 oz)  Height:       Weight change: 0.136 kg (4.8 oz)  Intake/Output Summary (Last 24 hours) at 03/11/2018 1010 Last data filed at 03/11/2018 0903 Gross per 24 hour  Intake 1190 ml  Output 1575 ml  Net -385 ml       Labs: Basic Metabolic Panel: Recent Labs  Lab 03/09/18 1529 03/10/18 0815 03/11/18 0833  NA 143 142 141  K 5.4* 5.2* 4.9  CL 121* 118* 113*  CO2 16* 16* 16*  GLUCOSE 124* 105* 106*  BUN 55* 58* 65*  CREATININE 3.52* 3.50* 3.36*  CALCIUM 8.3* 8.2* 8.3*  PHOS  --   --  3.9   Liver Function Tests: Recent Labs  Lab 03/08/18 1552 03/11/18 0833  AST 30  --   ALT 22  --   ALKPHOS 28*  --   BILITOT 1.7*  --   PROT 6.5  --   ALBUMIN 2.6* 2.5*   No results for input(s): LIPASE, AMYLASE in the last 168 hours. No results for input(s): AMMONIA in the last 168 hours. CBC: Recent Labs  Lab 03/08/18 1552 03/09/18 0554  WBC 5.4 5.4  NEUTROABS 3.7  --   HGB 8.7* 8.7*  HCT 28.8* 29.2*  MCV 106.3* 108.6*  PLT 100* 95*   Cardiac Enzymes: Recent Labs  Lab 03/08/18 1552 03/08/18 2350 03/09/18 0554  TROPONINI 0.06* 0.06* 0.05*   CBG: Recent Labs  Lab 03/08/18 1524  GLUCAP 105*    Iron Studies: No results for input(s): IRON, TIBC, TRANSFERRIN, FERRITIN in the last 72 hours. Studies/Results: US Renal  Result Date: 03/09/2018 CLINICAL DATA:  Acute renal injury EXAM: RENAL / URINARY TRACT ULTRASOUND COMPLETE COMPARISON:  CT 12/16/2011 FINDINGS: Right Kidney: Length: 12.9 cm. Thinning of the renal cortex is noted with mild-to-moderate hydronephrosis noted. Slight increase in cortical echogenicity. Several hypoechoic lesions of the liver are identified, the largest appearing anechoic and representing simple cysts measuring 2.4 x 2.2 x 2.7 cm arising off the lateral interpolar aspect and 1.9 x 1.5 x 1.9 cm arising off the  lower pole. Several smaller indeterminate hypoechoic lesions are noted more likely to represent small complex cysts with low-level internal echoes noted. No nephrolithiasis is identified. No definite solid vascular masses are identified. Left Kidney: Length: 14.7 cm. Thinning of the renal cortex is also noted on the left. There is slight increase in cortical echogenicity as well. A dominant cyst measuring 6 x 5.5 x 5 cm is identified arising off the upper pole without worrisome features. Low-level internal echoes may represent debris or acoustic artifacts. No solid vascular lesions are identified. No hydronephrosis. Bladder: Appears normal for degree of bladder distention. Only a left ureteral jet could be documented. IMPRESSION: Increased echogenicity of both kidneys with cortical thinning and bilateral renal cysts some of which appear complex with low-level internal echoes. Increased echogenicity may reflect a component of medical renal disease. Mild-to-moderate right-sided hydronephrosis is noted and there was no demonstrable ureteral jet seen in the bladder to suggest patency of the right ureter. An obstructing calculus, stenosis of the ureter or ureteral lesion is not entirely excluded. Electronically Signed   By: Tollie Eth M.D.   On: 03/09/2018 17:33   Ct Renal Stone Study  Result Date: 03/10/2018 CLINICAL DATA:  Inpatient. Urinary tract stone, symptomatic. Right hydronephrosis on sonogram. EXAM: CT ABDOMEN AND PELVIS WITHOUT CONTRAST TECHNIQUE: Multidetector CT imaging of the abdomen and pelvis was performed following the standard protocol without IV contrast. COMPARISON:  03/09/2018 renal sonogram. 12/16/2011 CT abdomen/pelvis. FINDINGS: Lower chest: Cardiomegaly. ICD lead is seen in the right ventricle. Coronary atherosclerosis. Small dependent right pleural effusion. Bronchiectasis with associated 4.8 x 3.4 cm focus of masslike consolidation in the medial basilar left lower lobe (series 4/image 15),  previously 4.9 x 3.7 cm on 12/16/2011 CT, not appreciably changed. Hepatobiliary: Diffusely irregular liver surface, compatible with hepatic cirrhosis. No liver mass. Contracted gallbladder with subcentimeter calcified gallstone. No definite gallbladder wall thickening. No biliary ductal dilatation. Pancreas: Normal, with no mass or duct dilation. Spleen: Normal size. No mass. Adrenals/Urinary Tract: Normal adrenals. Obstructing adjacent 11 mm and 6 mm stones in the proximal pelvic segment of the right ureter with moderate right hydroureteronephrosis. Nonobstructing 8 mm stone in the right renal pelvis. Nonobstructing 14 mm lower right renal stone. Additional 8 mm stone in the distal pelvic segment of the right ureter. Layering 6 mm and 4 mm right bladder stones. No left renal stones. No left hydronephrosis. Normal caliber left ureter, with no left ureteral stones. Severe right renal parenchymal atrophy. Moderate left renal parenchymal atrophy. Simple renal cysts in both kidneys, largest 5.7 cm in the posterior upper left kidney. Multiple indeterminate hyperdense and hypodense renal lesions in both kidneys, largest 2.1 cm in the anterior lower right kidney (series 3/image 41), which has increased from 1.1 cm on 12/16/2011 CT. Otherwise normal bladder. Stomach/Bowel: Small hiatal hernia. Otherwise normal nondistended stomach. Normal caliber small bowel  with no small bowel wall thickening. Normal appendix. Normal large bowel with no diverticulosis, large bowel wall thickening or pericolonic fat stranding. Vascular/Lymphatic: Atherosclerotic nonaneurysmal abdominal aorta. No pathologically enlarged lymph nodes in the abdomen or pelvis. Reproductive: Mildly enlarged prostate. Other: No pneumoperitoneum. Small volume ascites. Moderate anasarca. Musculoskeletal: No aggressive appearing focal osseous lesions. Diffuse patchy sclerosis in the osseous structures, probably renal osteodystrophy. Severe thoracolumbar  spondylosis. IMPRESSION: 1. Multiple obstructing stones in the pelvic segment of the right ureter, with moderate right hydroureteronephrosis. Additional nonobstructing right renal and layering right bladder stones. 2. Numerous indeterminate renal lesions in both kidneys, largest 2.1 cm in the anterior lower right kidney, which has increased in size since 2013, cannot exclude renal cell carcinoma. MRI abdomen without and with IV contrast may be obtained on a short term outpatient basis for further characterization. If the patient cannot receive IV gadolinium contrast due to GFR less than 30, recommend attention on follow-up noncontrast CT or MRI abdomen in 6 months. 3. Cirrhosis. No noncontrast CT evidence of liver mass. Small volume ascites. 4. Small dependent right pleural effusion. Chronic bronchiectasis with associated masslike consolidation in the medial basilar left lower lobe, not appreciably changed since 2013, presumably chronic postinfectious/postinflammatory scarring. 5. Chronic findings include: Aortic Atherosclerosis (ICD10-I70.0). Cardiomegaly. Coronary atherosclerosis. Small hiatal hernia. Cholelithiasis. Mildly enlarged prostate. Renal osteodystrophy. Electronically Signed   By: Delbert Phenix M.D.   On: 03/10/2018 00:57    Medications: Infusions: . cefTRIAXone (ROCEPHIN)  IV 1 g (03/10/18 1954)    Scheduled Medications: . atorvastatin  40 mg Oral q1800  . feeding supplement (ENSURE ENLIVE)  237 mL Oral BID BM  . furosemide  80 mg Intravenous BID  . multivitamin with minerals  1 tablet Oral Daily  . sodium bicarbonate  1,300 mg Oral TID    have reviewed scheduled and prn medications.  Physical Exam: General: Not in distress Heart:RRR, s1s2 nl, no rubs Lungs: Bibasilar decreased breath sound, no wheezing Abdomen:soft, Non-tender, non-distended Extremities:leg edema +   Alecea Trego Prasad Willoughby Doell 03/11/2018,10:10 AM  LOS: 3 days

## 2018-03-11 NOTE — Progress Notes (Signed)
Progress Note  Patient Name: Tyler Gray Date of Encounter: 03/11/2018  Primary Cardiologist: Lesleigh Noe, MD   Subjective   No chest pain or dyspnea  Inpatient Medications    Scheduled Meds: . atorvastatin  40 mg Oral q1800  . feeding supplement (ENSURE ENLIVE)  237 mL Oral BID BM  . furosemide  80 mg Intravenous BID  . multivitamin with minerals  1 tablet Oral Daily  . sodium bicarbonate  1,300 mg Oral TID   Continuous Infusions: . cefTRIAXone (ROCEPHIN)  IV 1 g (03/10/18 1954)   PRN Meds: acetaminophen, ondansetron **OR** ondansetron (ZOFRAN) IV, polyethylene glycol   Vital Signs    Vitals:   03/10/18 1709 03/10/18 2024 03/11/18 0427 03/11/18 1115  BP: 102/74 110/80 115/84 105/72  Pulse: 67 64 77 63  Resp: 20  18 12   Temp: (!) 97.5 F (36.4 C) 98 F (36.7 C) (!) 97.5 F (36.4 C) 97.6 F (36.4 C)  TempSrc: Oral Oral Oral Oral  SpO2: 100% 100% 100% 100%  Weight:   170 lb 8 oz (77.3 kg)   Height:        Intake/Output Summary (Last 24 hours) at 03/11/2018 1139 Last data filed at 03/11/2018 1100 Gross per 24 hour  Intake 1290 ml  Output 1275 ml  Net 15 ml   Filed Weights   03/09/18 0500 03/10/18 0544 03/11/18 0427  Weight: 172 lb 14.4 oz (78.4 kg) 170 lb 3.2 oz (77.2 kg) 170 lb 8 oz (77.3 kg)    Telemetry    Paced rhytym, with PVCs and underlying atrial fibrillation - Personally Reviewed  Physical Exam   GEN: Frail Elderly male, NAD Neck: supple Cardiac: irregular Respiratory: CTA anteriorly  GI: Soft, NT/ND MS: 2+edema Neuro:  Grossly intact   Labs    Chemistry Recent Labs  Lab 03/08/18 1552  03/09/18 1529 03/10/18 0815 03/11/18 0833  NA 141   < > 143 142 141  K 5.7*   < > 5.4* 5.2* 4.9  CL 121*   < > 121* 118* 113*  CO2 14*   < > 16* 16* 16*  GLUCOSE 111*   < > 124* 105* 106*  BUN 53*   < > 55* 58* 65*  CREATININE 3.45*   < > 3.52* 3.50* 3.36*  CALCIUM 8.3*   < > 8.3* 8.2* 8.3*  PROT 6.5  --   --   --   --   ALBUMIN  2.6*  --   --   --  2.5*  AST 30  --   --   --   --   ALT 22  --   --   --   --   ALKPHOS 28*  --   --   --   --   BILITOT 1.7*  --   --   --   --   GFRNONAA 15*   < > 15* 15* 16*  GFRAA 17*   < > 17* 17* 18*  ANIONGAP 6   < > 6 8 12    < > = values in this interval not displayed.     Hematology Recent Labs  Lab 03/08/18 1552 03/09/18 0554  WBC 5.4 5.4  RBC 2.71* 2.69*  HGB 8.7* 8.7*  HCT 28.8* 29.2*  MCV 106.3* 108.6*  MCH 32.1 32.3  MCHC 30.2 29.8*  RDW 15.5 15.7*  PLT 100* 95*    Cardiac Enzymes Recent Labs  Lab 03/08/18 1552 03/08/18 2350 03/09/18 0554  TROPONINI 0.06* 0.06* 0.05*     BNP Recent Labs  Lab 03/08/18 1552  BNP 2,285.4*     Radiology    US Renal  Result Date: 03/09/2018 CLINICAL DATA:  Acute renal injury EXAM: RENAL / URINARY TRACT ULTRASOUND COMPLETE COMPARISON:  CT 12/16/2011 FINDINGS: Right Kidney: Length: 12.9 cm. Thinning of the renal cortex is noted with mild-to-moderate hydronephrosis noted. Slight increase in cortical echogenicity. Several hypoechoic lesions of the liver are identified, the largest appearing anechoic and representing simple cysts measuring 2.4 x 2.2 x 2.7 cm arising off the lateral interpolar aspect and 1.9 x 1.5 x 1.9 cm arising off the lower pole. Several smaller indeterminate hypoechoic lesions are noted more likely to represent small complex cysts with low-level internal echoes noted. No nephrolithiasis is identified. No definite solid vascular masses are identified. Left Kidney: Length: 14.7 cm. Thinning of the renal cortex is also noted on the left. There is slight increase in cortical echogenicity as well. A dominant cyst measuring 6 x 5.5 x 5 cm is identified arising off the upper pole without worrisome features. Low-level internal echoes may represent debris or acoustic artifacts. No solid vascular lesions are identified. No hydronephrosis. Bladder: Appears normal for degree of bladder distention. Only a left ureteral  jet could be documented. IMPRESSION: Increased echogenicity of both kidneys with cortical thinning and bilateral renal cysts some of which appear complex with low-level internal echoes. Increased echogenicity may reflect a component of medical renal disease. Mild-to-moderate right-sided hydronephrosis is noted and there was no demonstrable ureteral jet seen in the bladder to suggest patency of the right ureter. An obstructing calculus, stenosis of the ureter or ureteral lesion is not entirely excluded. Electronically Signed   By: Tollie Eth M.D.   On: 03/09/2018 17:33   Ct Renal Stone Study  Result Date: 03/10/2018 CLINICAL DATA:  Inpatient. Urinary tract stone, symptomatic. Right hydronephrosis on sonogram. EXAM: CT ABDOMEN AND PELVIS WITHOUT CONTRAST TECHNIQUE: Multidetector CT imaging of the abdomen and pelvis was performed following the standard protocol without IV contrast. COMPARISON:  03/09/2018 renal sonogram. 12/16/2011 CT abdomen/pelvis. FINDINGS: Lower chest: Cardiomegaly. ICD lead is seen in the right ventricle. Coronary atherosclerosis. Small dependent right pleural effusion. Bronchiectasis with associated 4.8 x 3.4 cm focus of masslike consolidation in the medial basilar left lower lobe (series 4/image 15), previously 4.9 x 3.7 cm on 12/16/2011 CT, not appreciably changed. Hepatobiliary: Diffusely irregular liver surface, compatible with hepatic cirrhosis. No liver mass. Contracted gallbladder with subcentimeter calcified gallstone. No definite gallbladder wall thickening. No biliary ductal dilatation. Pancreas: Normal, with no mass or duct dilation. Spleen: Normal size. No mass. Adrenals/Urinary Tract: Normal adrenals. Obstructing adjacent 11 mm and 6 mm stones in the proximal pelvic segment of the right ureter with moderate right hydroureteronephrosis. Nonobstructing 8 mm stone in the right renal pelvis. Nonobstructing 14 mm lower right renal stone. Additional 8 mm stone in the distal pelvic  segment of the right ureter. Layering 6 mm and 4 mm right bladder stones. No left renal stones. No left hydronephrosis. Normal caliber left ureter, with no left ureteral stones. Severe right renal parenchymal atrophy. Moderate left renal parenchymal atrophy. Simple renal cysts in both kidneys, largest 5.7 cm in the posterior upper left kidney. Multiple indeterminate hyperdense and hypodense renal lesions in both kidneys, largest 2.1 cm in the anterior lower right kidney (series 3/image 41), which has increased from 1.1 cm on 12/16/2011 CT. Otherwise normal bladder. Stomach/Bowel: Small hiatal hernia. Otherwise normal nondistended stomach. Normal caliber small  bowel with no small bowel wall thickening. Normal appendix. Normal large bowel with no diverticulosis, large bowel wall thickening or pericolonic fat stranding. Vascular/Lymphatic: Atherosclerotic nonaneurysmal abdominal aorta. No pathologically enlarged lymph nodes in the abdomen or pelvis. Reproductive: Mildly enlarged prostate. Other: No pneumoperitoneum. Small volume ascites. Moderate anasarca. Musculoskeletal: No aggressive appearing focal osseous lesions. Diffuse patchy sclerosis in the osseous structures, probably renal osteodystrophy. Severe thoracolumbar spondylosis. IMPRESSION: 1. Multiple obstructing stones in the pelvic segment of the right ureter, with moderate right hydroureteronephrosis. Additional nonobstructing right renal and layering right bladder stones. 2. Numerous indeterminate renal lesions in both kidneys, largest 2.1 cm in the anterior lower right kidney, which has increased in size since 2013, cannot exclude renal cell carcinoma. MRI abdomen without and with IV contrast may be obtained on a short term outpatient basis for further characterization. If the patient cannot receive IV gadolinium contrast due to GFR less than 30, recommend attention on follow-up noncontrast CT or MRI abdomen in 6 months. 3. Cirrhosis. No noncontrast CT  evidence of liver mass. Small volume ascites. 4. Small dependent right pleural effusion. Chronic bronchiectasis with associated masslike consolidation in the medial basilar left lower lobe, not appreciably changed since 2013, presumably chronic postinfectious/postinflammatory scarring. 5. Chronic findings include: Aortic Atherosclerosis (ICD10-I70.0). Cardiomegaly. Coronary atherosclerosis. Small hiatal hernia. Cholelithiasis. Mildly enlarged prostate. Renal osteodystrophy. Electronically Signed   By: Delbert Phenix M.D.   On: 03/10/2018 00:57    Cardiac Studies   2D Echo 03/09/18 Study Conclusions  - Left ventricle: The cavity size was severely dilated. Systolic   function was severely reduced. The estimated ejection fraction   was in the range of 20% to 25%. Diffuse hypokinesis. There is   akinesis of the inferoseptal myocardium. The study is not   technically sufficient to allow evaluation of LV diastolic   function. - Aortic valve: There was mild regurgitation. - Aorta: Aortic root dimension: 43 mm (ED). - Ascending aorta: The ascending aorta was mildly dilated. - Mitral valve: Mildly thickened leaflets . There was moderate to   severe regurgitation directed posteriorly. - Left atrium: The atrium was severely dilated. - Right ventricle: The cavity size was moderately dilated. Wall   thickness was normal. Pacer wire or catheter noted in right   ventricle. - Right atrium: The atrium was severely dilated. - Tricuspid valve: There was severe regurgitation. - Pulmonary arteries: Systolic pressure was moderately increased.   PA peak pressure: 50 mm Hg (S).   Patient Profile     84 year old male with past medical history of nonischemic cardiomyopathy, atrial fibrillation on chronic Coumadin, chronic systolic congestive heart failure, prior Biv ICD with nonfunctional atrial lead, chronic stage III kidney disease, being seen for evaluation of acute on chronic systolic congestive heart  failure, at the request of Dr. Jarvis Newcomer, Internal Medicine.  Assessment & Plan    1. Acute on Chronic Systolic CHF: Starting to diurese.  Remains volume overloaded.  Continue Lasix 80 mg IV twice daily and follow renal function.  Volume excess felt to be a combination of acute on chronic systolic congestive heart failure, worsening renal function and low albumin.  2. Nonischemic Cardiomyopathy: repeat echo this admit shows EF of 20-25% with diffuse hypokinesis and akinesis of the inferoseptal myocardium.  Continue diuresis.  No ACE inhibitor or ARB given renal insufficiency.  No beta-blocker in setting of acute heart failure.  Blood pressure will not allow hydralazine nitrates.  3. Atrial Fibrillation: Heart rate controlled on no medications.  Can resume Coumadin later.  4. Acute on Chronic Kidney Disease, Stage III: Nephrology following.  5. Elevated Troponin: flat low level trend, 0.06>>0.06>>0.05. No anginal symptoms. Suspect mild trop elevation is 2/2 acute CHF. No plans for ischemic w/u.   6. UTI: management per primary team. On IV Rocephin, 1 g Q24 hr.   Prognosis appears to be poor.  Suggest palliative care consult.   For questions or updates, please contact [facility] Please consult [email or web address] for contact info under Cardiology/STEMI.      Signed, Olga Millers, MD  03/11/2018, 11:39 AM

## 2018-03-12 LAB — RENAL FUNCTION PANEL
ALBUMIN: 2.4 g/dL — AB (ref 3.5–5.0)
Anion gap: 9 (ref 5–15)
BUN: 68 mg/dL — ABNORMAL HIGH (ref 8–23)
CO2: 20 mmol/L — ABNORMAL LOW (ref 22–32)
Calcium: 8.2 mg/dL — ABNORMAL LOW (ref 8.9–10.3)
Chloride: 112 mmol/L — ABNORMAL HIGH (ref 98–111)
Creatinine, Ser: 3.42 mg/dL — ABNORMAL HIGH (ref 0.61–1.24)
GFR calc Af Amer: 18 mL/min — ABNORMAL LOW (ref >60)
GFR, EST NON AFRICAN AMERICAN: 15 mL/min — AB (ref >60)
Glucose, Bld: 106 mg/dL — ABNORMAL HIGH (ref 70–99)
PHOSPHORUS: 4.1 mg/dL (ref 2.5–4.6)
Potassium: 4.3 mmol/L (ref 3.5–5.1)
SODIUM: 141 mmol/L (ref 135–145)

## 2018-03-12 LAB — PROTIME-INR
INR: 2.64
PROTHROMBIN TIME: 28 s — AB (ref 11.4–15.2)

## 2018-03-12 MED ORDER — WARFARIN SODIUM 3 MG PO TABS
3.0000 mg | ORAL_TABLET | Freq: Once | ORAL | Status: AC
  Administered 2018-03-12: 3 mg via ORAL
  Filled 2018-03-12: qty 1

## 2018-03-12 NOTE — Progress Notes (Signed)
PROGRESS NOTE  KIRE FERG  ZOX:[id number] DOB: April 15, 1933 DOA: 03/08/2018 PCP: Renaye Rakers, MD  Outpatient Specialists: Cardiology, Dr. Verdis Prime Brief Narrative: Tyler Gray is an 84 y.o. male with a history of NICM, chronic systolic CHF s/p BiV pacemaker, PAF on coumadin, stage III CKD who presented with increasing weakness, lower extremity edema and shortness of breath. Pt is difficult historian but appeared volume overloaded. Afebrile, hemodynamically stable. UA with large leuks, > 50 WBC, few bacteria. CMP with K of 5.7 and Cr 3.45 ( baseline 2.3-2.7). BNP > 2200. Troponin slightly elevated at 0.06. Hgb 8.7 down from baseline of 10 otherwise no acute changes on CBC. INR elevated over goal at 3.69. CXR with mild bibasilar atelectasis with stable cardiomegaly and no pulmonary edema. XR of hand with no acute osseous abnormalities, and soft tissue swelling near right 5th MCP joint. CT head/Cervical spine with no acute intracranial abnormalities with cerebral volume loss and chronic small vessel ischemic changes and severe degenerative changes in cervical spine  Lasix was started and cardiology consulted. Lasix has been increased and beta blocker stopped by cardiology. Renal function worsened and renal ultrasound demonstrated right hydronephrosis. CT confirmed presence of obstructing ureteral and nonobstructing bladder stones which, per discussion with urology, have been there since 2013. Nephrology consulted. Remains volume overloaded with echocardiogram showing EF 20-25%. Palliative care consulted for continued goals of care discussions as he is entering into advanced CKD but not felt to be a dialysis candidate.  Assessment & Plan: Active Problems:   Paroxysmal atrial fibrillation (HCC)   CHRONIC SYSTOLIC HEART FAILURE   Chronic kidney disease, stage IV (severe) (HCC)   Automatic implantable cardioverter-defibrillator in situ   AKI (acute kidney injury) (HCC)   Acute lower UTI   Acute  systolic CHF (congestive heart failure) (HCC)   Elevated INR   Hyperkalemia   Elevated troponin   Malnutrition of moderate degree   Pressure injury of skin  Generalized weakness: Multifactorial due to heart failure, possibly also UTI and deconditioning.  - Treat as below - Will benefit from short term SNF.   AKI on stage III CKD: Decreased perfusion from depleted EABV in addition to chronic renal disease and essentially solitary kidney with chronic right ureteral obstruction. Renal U/S revealed a right kidney that is thinned with moderate hydronephrosis and increased echogenicity. No right ureteral jet was seen. Left kidney is 14.7cm with cortical thinning and echogenicity.  - Monitor closely with diuresis.  - Appreciate nephrology recommendations. Discussed with family that the patient would likely not be a good hemodialysis candidate.     Hyperkalemia: Resolved - Continue telemetry, no ECG changes.   Metabolic acidosis: Due to renal failure - Continue bicarbonate.   Acute on chronic systolic CHF: s/p ICD.  - Continue IV lasix. Remains volume overloaded. Fortunately UOP picking up and Cr stable.  - Monitor strict I/O (condom cath), daily weights, BMP - Sodium restriction - No ACE/ARB/entresto with renal insufficiency/hyperK. Holding beta blocker with need for diuresis. May need milrinone.  - Echo showed EF 20-25% with akinesis of inferoseptal wall, severe biatrial dilatation with mod-severe MR and severe TR, elevated pulmonary artery pressure. Cardiology recommended palliative care consultation.  Ureterolithiasis with right hydronephrosis: Known previously and confirmed on CT stone protocol 6/27. Patient and his wife confirm that it was known years ago and the risks of treatment with his cardiac function were felt to prohibit treatment. - No further intervention planned per discussion with urology, Dr. Vernie Ammons, 6/27.   Acute lower  UTI: UA consistent with infection, culture  polyclonal. Stones present, though pt not septic.  - Completed ceftriaxone x3 days  Atrial fibrillation, rate controlled:  - Continue telemetry - Restart coumadin with goal INR 2-3 (check daily)  Elevated troponin. Likely demand ischemia.  - No intervention planned per cardiology  Supratherapeutic INR: 3.6 on admission, trended down spontaneously.  - Restart coumadin as above  Irregular liver contour on imaging: Compatible with cirrhosis, though he doesn't carry this diagnosis. Would be compatible with thrombocytopenia, hypoalbuminemia. Appears compensated with normal AST/ALT, no ascites or encephalopathy. INR elevated due to coumadin. No ascites noted.  - Will defer further work up for now.    DVT prophylaxis: Coumadin Code Status: Full Family Communication: None at bedside Disposition Plan: SNF recommended by therapy. Will need to continue monitoring patient's clinical course for definite decisions. Given poor prognosis, I have begun palliative care discussions and asked palliative care team for evaluation as well.  Consultants:   Cardiology, Dr. Jens Som  Nephrology, Dr. Ronalee Belts  Palliative care team  Procedures:   Echocardiogram 03/09/2018:  - Left ventricle: The cavity size was severely dilated. Systolic   function was severely reduced. The estimated ejection fraction   was in the range of 20% to 25%. Diffuse hypokinesis. There is   akinesis of the inferoseptal myocardium. The study is not   technically sufficient to allow evaluation of LV diastolic   function. - Aortic valve: There was mild regurgitation. - Aorta: Aortic root dimension: 43 mm (ED). - Ascending aorta: The ascending aorta was mildly dilated. - Mitral valve: Mildly thickened leaflets . There was moderate to   severe regurgitation directed posteriorly. - Left atrium: The atrium was severely dilated. - Right ventricle: The cavity size was moderately dilated. Wall   thickness was normal. Pacer wire or  catheter noted in right   ventricle. - Right atrium: The atrium was severely dilated. - Tricuspid valve: There was severe regurgitation. - Pulmonary arteries: Systolic pressure was moderately increased.   PA peak pressure: 50 mm Hg (S).  Antimicrobials:  Ceftriaxone 6/26 - 6/28  Subjective: Unaware of progress regarding swelling. No dyspnea or other problems.   Objective: Vitals:   03/11/18 1115 03/11/18 2114 03/12/18 0552 03/12/18 1148  BP: 105/72 104/64 109/64 106/64  Pulse: 63 70 68 (!) 107  Resp: 12 16 16 19   Temp: 97.6 F (36.4 C) 98.3 F (36.8 C) 99 F (37.2 C) 98.8 F (37.1 C)  TempSrc: Oral Oral Oral Oral  SpO2: 100% 98% 99% 100%  Weight:   78 kg (172 lb)   Height:        Intake/Output Summary (Last 24 hours) at 03/12/2018 1415 Last data filed at 03/12/2018 1358 Gross per 24 hour  Intake 480 ml  Output 2100 ml  Net -1620 ml   Filed Weights   03/10/18 0544 03/11/18 0427 03/12/18 0552  Weight: 77.2 kg (170 lb 3.2 oz) 77.3 kg (170 lb 8 oz) 78 kg (172 lb)   Gen: Elderly male in no distress Pulm: Nonlabored breathing room air. Clear but diminished at bases. CV: Irreg. No murmur, rub, or gallop. No JVD, 2+ pitting symmetric LE dependent edema which has improved from yesterday. GI: Abdomen soft, non-tender, non-distended, with normoactive bowel sounds.  Ext: Warm, no deformities Skin: Right shin abrasion dressing with serous nonmalodorous discharge, stable.  Neuro: Alert and oriented. No focal neurological deficits. Psych: Judgement and insight appear fair. Mood euthymic & affect congruent. Behavior is appropriate.    CBC: Recent Labs  Lab 03/08/18 1552 03/09/18 0554  WBC 5.4 5.4  NEUTROABS 3.7  --   HGB 8.7* 8.7*  HCT 28.8* 29.2*  MCV 106.3* 108.6*  PLT 100* 95*   Basic Metabolic Panel: Recent Labs  Lab 03/09/18 0554 03/09/18 1529 03/10/18 0815 03/11/18 0833 03/12/18 0637  NA 144 143 142 141 141  K 5.8* 5.4* 5.2* 4.9 4.3  CL 122* 121* 118* 113*  112*  CO2 13* 16* 16* 16* 20*  GLUCOSE 98 124* 105* 106* 106*  BUN 54* 55* 58* 65* 68*  CREATININE 3.48* 3.52* 3.50* 3.36* 3.42*  CALCIUM 8.3* 8.3* 8.2* 8.3* 8.2*  PHOS  --   --   --  3.9 4.1   GFR: Estimated Creatinine Clearance: 15.8 mL/min (A) (by C-G formula based on SCr of 3.42 mg/dL (H)). Liver Function Tests: Recent Labs  Lab 03/08/18 1552 03/11/18 0833 03/12/18 0637  AST 30  --   --   ALT 22  --   --   ALKPHOS 28*  --   --   BILITOT 1.7*  --   --   PROT 6.5  --   --   ALBUMIN 2.6* 2.5* 2.4*   No results for input(s): LIPASE, AMYLASE in the last 168 hours. No results for input(s): AMMONIA in the last 168 hours. Coagulation Profile: Recent Labs  Lab 03/08/18 1552 03/09/18 0554 03/10/18 0704 03/11/18 0444 03/12/18 0637  INR 3.69 3.83 3.73 3.30 2.64   Cardiac Enzymes: Recent Labs  Lab 03/08/18 1552 03/08/18 2350 03/09/18 0554  TROPONINI 0.06* 0.06* 0.05*   BNP (last 3 results) Recent Labs    10/14/17 1209  PROBNP 15,115*   HbA1C: No results for input(s): HGBA1C in the last 72 hours. CBG: Recent Labs  Lab 03/08/18 1524  GLUCAP 105*   Lipid Profile: No results for input(s): CHOL, HDL, LDLCALC, TRIG, CHOLHDL, LDLDIRECT in the last 72 hours. Thyroid Function Tests: No results for input(s): TSH, T4TOTAL, FREET4, T3FREE, THYROIDAB in the last 72 hours. Anemia Panel: No results for input(s): VITAMINB12, FOLATE, FERRITIN, TIBC, IRON, RETICCTPCT in the last 72 hours. Urine analysis:    Component Value Date/Time   COLORURINE YELLOW 03/08/2018 1628   APPEARANCEUR CLOUDY (A) 03/08/2018 1628   LABSPEC 1.012 03/08/2018 1628   PHURINE 5.0 03/08/2018 1628   GLUCOSEU NEGATIVE 03/08/2018 1628   HGBUR MODERATE (A) 03/08/2018 1628   BILIRUBINUR NEGATIVE 03/08/2018 1628   KETONESUR NEGATIVE 03/08/2018 1628   PROTEINUR 100 (A) 03/08/2018 1628   NITRITE NEGATIVE 03/08/2018 1628   LEUKOCYTESUR LARGE (A) 03/08/2018 1628   Recent Results (from the past 240  hour(s))  Urine culture     Status: Abnormal   Collection Time: 03/08/18  5:18 PM  Result Value Ref Range Status   Specimen Description URINE, CLEAN CATCH  Final   Special Requests   Final    NONE Performed at [facility], [address], [place], [place] [address]    Culture MULTIPLE SPECIES PRESENT, SUGGEST RECOLLECTION (A)  Final   Report Status 03/09/2018 FINAL  Final      Radiology Studies: No results found.  Scheduled Meds: . atorvastatin  40 mg Oral q1800  . feeding supplement (ENSURE ENLIVE)  237 mL Oral BID BM  . furosemide  80 mg Intravenous BID  . multivitamin with minerals  1 tablet Oral Daily  . sodium bicarbonate  1,300 mg Oral TID  . warfarin  3 mg Oral ONCE-1800   Continuous Infusions:    LOS: 4 days  Time spent: 25 minutes.  Tyrone Nine, MD [facility] [email or web address] Password [email or web address] 03/12/2018, 2:15 PM

## 2018-03-12 NOTE — Progress Notes (Signed)
Progress Note  Patient Name: Tyler Gray Date of Encounter: 03/12/2018  Primary Cardiologist: Lesleigh Noe, MD   Subjective   Pt denies CP or dyspnea  Inpatient Medications    Scheduled Meds: . atorvastatin  40 mg Oral q1800  . feeding supplement (ENSURE ENLIVE)  237 mL Oral BID BM  . furosemide  80 mg Intravenous BID  . multivitamin with minerals  1 tablet Oral Daily  . sodium bicarbonate  1,300 mg Oral TID   Continuous Infusions:  PRN Meds: acetaminophen, ondansetron **OR** ondansetron (ZOFRAN) IV, polyethylene glycol   Vital Signs    Vitals:   03/11/18 0427 03/11/18 1115 03/11/18 2114 03/12/18 0552  BP: 115/84 105/72 104/64 109/64  Pulse: 77 63 70 68  Resp: 18 12 16 16   Temp: (!) 97.5 F (36.4 C) 97.6 F (36.4 C) 98.3 F (36.8 C) 99 F (37.2 C)  TempSrc: Oral Oral Oral Oral  SpO2: 100% 100% 98% 99%  Weight: 170 lb 8 oz (77.3 kg)   172 lb (78 kg)  Height:        Intake/Output Summary (Last 24 hours) at 03/12/2018 0939 Last data filed at 03/12/2018 0931 Gross per 24 hour  Intake 400 ml  Output 1950 ml  Net -1550 ml   Filed Weights   03/10/18 0544 03/11/18 0427 03/12/18 0552  Weight: 170 lb 3.2 oz (77.2 kg) 170 lb 8 oz (77.3 kg) 172 lb (78 kg)    Telemetry    Paced rhytym, with PVCs and underlying atrial fibrillation; possible failure to sense - Personally Reviewed  Physical Exam   GEN: Frail; NAD Neck: supple, + JVD Cardiac: irregular; normal rate Respiratory: CTA anteriorly; no wheeze GI: Soft, NT, mildly distended MS: 2-3 +edema Neuro:  No focal findings   Labs    Chemistry Recent Labs  Lab 03/08/18 1552  03/10/18 0815 03/11/18 0833 03/12/18 0637  NA 141   < > 142 141 141  K 5.7*   < > 5.2* 4.9 4.3  CL 121*   < > 118* 113* 112*  CO2 14*   < > 16* 16* 20*  GLUCOSE 111*   < > 105* 106* 106*  BUN 53*   < > 58* 65* 68*  CREATININE 3.45*   < > 3.50* 3.36* 3.42*  CALCIUM 8.3*   < > 8.2* 8.3* 8.2*  PROT 6.5  --   --   --    --   ALBUMIN 2.6*  --   --  2.5* 2.4*  AST 30  --   --   --   --   ALT 22  --   --   --   --   ALKPHOS 28*  --   --   --   --   BILITOT 1.7*  --   --   --   --   GFRNONAA 15*   < > 15* 16* 15*  GFRAA 17*   < > 17* 18* 18*  ANIONGAP 6   < > 8 12 9    < > = values in this interval not displayed.     Hematology Recent Labs  Lab 03/08/18 1552 03/09/18 0554  WBC 5.4 5.4  RBC 2.71* 2.69*  HGB 8.7* 8.7*  HCT 28.8* 29.2*  MCV 106.3* 108.6*  MCH 32.1 32.3  MCHC 30.2 29.8*  RDW 15.5 15.7*  PLT 100* 95*    Cardiac Enzymes Recent Labs  Lab 03/08/18 1552 03/08/18 2350 03/09/18 0554  TROPONINI  0.06* 0.06* 0.05*     BNP Recent Labs  Lab 03/08/18 1552  BNP 2,285.4*     Cardiac Studies   2D Echo 03/09/18 Study Conclusions  - Left ventricle: The cavity size was severely dilated. Systolic   function was severely reduced. The estimated ejection fraction   was in the range of 20% to 25%. Diffuse hypokinesis. There is   akinesis of the inferoseptal myocardium. The study is not   technically sufficient to allow evaluation of LV diastolic   function. - Aortic valve: There was mild regurgitation. - Aorta: Aortic root dimension: 43 mm (ED). - Ascending aorta: The ascending aorta was mildly dilated. - Mitral valve: Mildly thickened leaflets . There was moderate to   severe regurgitation directed posteriorly. - Left atrium: The atrium was severely dilated. - Right ventricle: The cavity size was moderately dilated. Wall   thickness was normal. Pacer wire or catheter noted in right   ventricle. - Right atrium: The atrium was severely dilated. - Tricuspid valve: There was severe regurgitation. - Pulmonary arteries: Systolic pressure was moderately increased.   PA peak pressure: 50 mm Hg (S).   Patient Profile     84 year old male with past medical history of nonischemic cardiomyopathy, atrial fibrillation on chronic Coumadin, chronic systolic congestive heart failure, prior  Biv ICD with nonfunctional atrial lead, chronic stage III kidney disease, being seen for evaluation of acute on chronic systolic congestive heart failure, at the request of Dr. Jarvis Newcomer, Internal Medicine.  Assessment & Plan    1. Acute on Chronic Systolic CHF: I/O -1100. Remains volume overloaded.  Continue Lasix 80 mg IV twice daily and follow renal function.  Volume excess felt to be a combination of acute on chronic systolic congestive heart failure, worsening renal function and low albumin.  2. Nonischemic Cardiomyopathy: repeat echo this admit shows EF of 20-25% with diffuse hypokinesis and akinesis of the inferoseptal myocardium.  Continue diuresis.  No ACE inhibitor or ARB given renal insufficiency.  No beta-blocker in setting of acute heart failure.  Blood pressure will not allow hydralazine nitrates.  3. Atrial Fibrillation: Heart rate controlled on no medications.  Resume coumadin with goal INR 2-3.  4. Acute on Chronic Kidney Disease, Stage III: Renal function unchanged this AM; follow with diuresis.  5. Elevated Troponin: flat low level trend, 0.06>>0.06>>0.05. No clear trend. No plans for ischemic w/u.   6. UTI: management per primary team. On IV Rocephin, 1 g Q24 hr.   7. Prior pacemaker: ? Failure to sense on telemetry. Will have device interrogated in AM though doubt he is a good candidate for aggressive measures.   For questions or updates, please contact [facility] Please consult [email or web address] for contact info under Cardiology/STEMI.      Signed, Olga Millers, MD  03/12/2018, 9:39 AM

## 2018-03-12 NOTE — Progress Notes (Signed)
No charge note.  Palliative care: Consult received and chart reviewed. Family not at bedside. Called and spoke with wife, Windell Moulding. She will not be here until later today. We scheduled goals of care meeting for tomorrow AM (7/1) at 10:00.   Thank you for this consult.  Gerlean Ren, DNP, AGNP-C Palliative Medicine Team Team Phone # [phone]  Pager # [phone]

## 2018-03-12 NOTE — Progress Notes (Signed)
ANTICOAGULATION CONSULT NOTE - Initial Consult  Pharmacy Consult for Warfarin Indication: atrial fibrillation  No Known Allergies  Patient Measurements: Height: 5\' 8"  (172.7 cm) Weight: 172 lb (78 kg) IBW/kg (Calculated) : 68.4  Vital Signs: Temp: 99 F (37.2 C) (06/30 0552) Temp Source: Oral (06/30 0552) BP: 109/64 (06/30 0552) Pulse Rate: 68 (06/30 0552)  Labs: Recent Labs    03/10/18 0704 03/10/18 0815 03/11/18 0444 03/11/18 0833 03/12/18 0637  LABPROT 36.6*  --  33.3*  --  28.0*  INR 3.73  --  3.30  --  2.64  CREATININE  --  3.50*  --  3.36* 3.42*    Estimated Creatinine Clearance: 15.8 mL/min (A) (by C-G formula based on SCr of 3.42 mg/dL (H)).   Medical History: Past Medical History:  Diagnosis Date  . Acute renal failure (ARF) (HCC)   . AICD (automatic cardioverter/defibrillator) present 04/13/2017   [employer]  . AKI (acute kidney injury) (HCC) 03/08/2018  . Arthritis    "mostly hands & fingers" (03/08/2018)  . Atrial fibrillation (HCC)   . CHF (congestive heart failure) (HCC)   . Chronic systolic heart failure (HCC)   . Falls frequently    "has been falling several times lately" (03/08/2018)  . History of blood transfusion    "for his heart" (03/08/2018)  . History of gout   . History of kidney stones   . Hyperlipidemia   . Hypertension   . Pneumonia    "twice, I think" (03/08/2018)    Assessment: 84 yo male admitted for generalized weakness. On Warfarin PTA for Atrial fibrillation.  On admission, INR was supratherapeutic at 3.6. Warfarin held for the last few days until INR came down to the therapeutic range, now 2.64.  Home Warfarin regimen 3 mg alternating with 4.5 mg every other day.  Goal of Therapy:  INR 2-3   Plan:  Will redose with Warfarin 3 mg this evening. Will monitor daily H&H and INR, redosing the Warfarin accordingly.  Tera Mater, PharmD, FCCM 03/12/2018,10:24 AM

## 2018-03-12 NOTE — Progress Notes (Signed)
[facility] NEPHROLOGY PROGRESS NOTE  Assessment/ Plan: Pt is a 84 y.o. yo male with history of A. fib on AC, CHF with biventricular pacemaker, EF 20%, right nephrolithiasis with obstruction, CKD, admitted with generalized weakness, shortness of breath and lower extremity edema.  Consulted for AKI on CKD.  Assessment/Plan:  #Acute on chronic kidney disease stage IV likely cardiorenal syndrome and probably not functioning right kidney: The baseline creatinine around 2.7. -UA with UTI. Urine PCR 0.91  -Right kidney is probably not functioning and left kidney has renal cortex thinness and increased echogenicity. -Serum creatinine level stable at 3.42 with urine output of 1800 cc in 24 hours.  Continue oral sodium bicarbonate.  On IV Lasix, likely transition to oral Lasix tomorrow.  Discussed with primary team.  -Monitor urine output, BMP.  Avoid nephrotoxins.  Given poor cardiac function and other co-morbidities patient may not tolerate dialysis.  I recommend palliative care consult evaluation.  #Moderate right hydroureteronephrosis with multiple obstructing stones: Per primary team Dr Jarvis Newcomer, it was discussed with the urologist (Dr. Vernie Ammons).  Patient had lithotripsy in the past and found to have a recurrent right renal stone and patient declined treatment in 2013.  I recommend to follow-up with urologist for further plan and follow-up imaging.  #Hyperkalemia due to renal failure: Improved, K 4.3. On IV lasix.  #Systolic congestive heart failure with EF of 20 %, NICM, Afib: Cardiology is following. Suggesting palliative care eval.  Subjective: Seen and examined at bedside.  Denies chest pain, shortness of breath, nausea vomiting.  Has generalized chronic weakness.  Objective Vital signs in last 24 hours: Vitals:   03/11/18 1115 03/11/18 2114 03/12/18 0552 03/12/18 1148  BP: 105/72 104/64 109/64 106/64  Pulse: 63 70 68 (!) 107  Resp: 12 16 16 19   Temp: 97.6 F (36.4 C)  98.3 F (36.8 C) 99 F (37.2 C) 98.8 F (37.1 C)  TempSrc: Oral Oral Oral Oral  SpO2: 100% 98% 99% 100%  Weight:   78 kg (172 lb)   Height:       Weight change: 0.68 kg (1 lb 8 oz)  Intake/Output Summary (Last 24 hours) at 03/12/2018 1149 Last data filed at 03/12/2018 1056 Gross per 24 hour  Intake 420 ml  Output 2200 ml  Net -1780 ml       Labs: Basic Metabolic Panel: Recent Labs  Lab 03/10/18 0815 03/11/18 0833 03/12/18 0637  NA 142 141 141  K 5.2* 4.9 4.3  CL 118* 113* 112*  CO2 16* 16* 20*  GLUCOSE 105* 106* 106*  BUN 58* 65* 68*  CREATININE 3.50* 3.36* 3.42*  CALCIUM 8.2* 8.3* 8.2*  PHOS  --  3.9 4.1   Liver Function Tests: Recent Labs  Lab 03/08/18 1552 03/11/18 0833 03/12/18 0637  AST 30  --   --   ALT 22  --   --   ALKPHOS 28*  --   --   BILITOT 1.7*  --   --   PROT 6.5  --   --   ALBUMIN 2.6* 2.5* 2.4*   No results for input(s): LIPASE, AMYLASE in the last 168 hours. No results for input(s): AMMONIA in the last 168 hours. CBC: Recent Labs  Lab 03/08/18 1552 03/09/18 0554  WBC 5.4 5.4  NEUTROABS 3.7  --   HGB 8.7* 8.7*  HCT 28.8* 29.2*  MCV 106.3* 108.6*  PLT 100* 95*   Cardiac Enzymes: Recent Labs  Lab 03/08/18 1552 03/08/18 2350 03/09/18 0554  TROPONINI  0.06* 0.06* 0.05*   CBG: Recent Labs  Lab 03/08/18 1524  GLUCAP 105*    Iron Studies: No results for input(s): IRON, TIBC, TRANSFERRIN, FERRITIN in the last 72 hours. Studies/Results: No results found.  Medications: Infusions:   Scheduled Medications: . atorvastatin  40 mg Oral q1800  . feeding supplement (ENSURE ENLIVE)  237 mL Oral BID BM  . furosemide  80 mg Intravenous BID  . multivitamin with minerals  1 tablet Oral Daily  . sodium bicarbonate  1,300 mg Oral TID  . warfarin  3 mg Oral ONCE-1800    have reviewed scheduled and prn medications.  Physical Exam: General: not in distress, Heart:RRR, s1s2 nl, no rubs Lungs: Bibasilar decreased breath sound, no  wheezing Abdomen:soft, Non-tender, non-distended Extremities:leg edema + improving Alert awake and following commands  Dron Prasad Bhandari 03/12/2018,11:49 AM  LOS: 4 days

## 2018-03-13 DIAGNOSIS — Z515 Encounter for palliative care: Secondary | ICD-10-CM

## 2018-03-13 DIAGNOSIS — I482 Chronic atrial fibrillation: Secondary | ICD-10-CM

## 2018-03-13 DIAGNOSIS — Z7189 Other specified counseling: Secondary | ICD-10-CM

## 2018-03-13 DIAGNOSIS — N184 Chronic kidney disease, stage 4 (severe): Secondary | ICD-10-CM

## 2018-03-13 DIAGNOSIS — Z95810 Presence of automatic (implantable) cardiac defibrillator: Secondary | ICD-10-CM

## 2018-03-13 DIAGNOSIS — E440 Moderate protein-calorie malnutrition: Secondary | ICD-10-CM

## 2018-03-13 LAB — BASIC METABOLIC PANEL
Anion gap: 14 (ref 5–15)
BUN: 74 mg/dL — AB (ref 8–23)
CO2: 21 mmol/L — ABNORMAL LOW (ref 22–32)
CREATININE: 3.3 mg/dL — AB (ref 0.61–1.24)
Calcium: 8.3 mg/dL — ABNORMAL LOW (ref 8.9–10.3)
Chloride: 107 mmol/L (ref 98–111)
GFR calc Af Amer: 18 mL/min — ABNORMAL LOW (ref >60)
GFR, EST NON AFRICAN AMERICAN: 16 mL/min — AB (ref >60)
GLUCOSE: 116 mg/dL — AB (ref 70–99)
Potassium: 4.2 mmol/L (ref 3.5–5.1)
SODIUM: 142 mmol/L (ref 135–145)

## 2018-03-13 LAB — PROTIME-INR
INR: 2.12
PROTHROMBIN TIME: 23.6 s — AB (ref 11.4–15.2)

## 2018-03-13 MED ORDER — WARFARIN - PHARMACIST DOSING INPATIENT
Freq: Every day | Status: DC
  Administered 2018-03-15 – 2018-03-20 (×4)

## 2018-03-13 MED ORDER — AMIODARONE HCL 200 MG PO TABS
400.0000 mg | ORAL_TABLET | Freq: Every day | ORAL | Status: DC
  Administered 2018-03-13 – 2018-03-22 (×10): 400 mg via ORAL
  Filled 2018-03-13 (×11): qty 2

## 2018-03-13 MED ORDER — WARFARIN SODIUM 2.5 MG PO TABS
4.5000 mg | ORAL_TABLET | Freq: Once | ORAL | Status: AC
  Administered 2018-03-13: 4.5 mg via ORAL
  Filled 2018-03-13: qty 1

## 2018-03-13 MED ORDER — AMIODARONE HCL 200 MG PO TABS: 400.0000 mg | ORAL_TABLET | Freq: Every day | ORAL | Status: DC

## 2018-03-13 NOTE — Progress Notes (Signed)
S:No new complaints O:BP 108/71 (BP Location: Left Arm)   Pulse 71   Temp 99.1 F (37.3 C) (Oral)   Resp 18   Ht 5\' 8"  (1.727 m)   Wt 76.7 kg (169 lb)   SpO2 96%   BMI 25.70 kg/m   Intake/Output Summary (Last 24 hours) at 03/13/2018 1510 Last data filed at 03/13/2018 1311 Gross per 24 hour  Intake 600 ml  Output 2150 ml  Net -1550 ml   Intake/Output: I/O last 3 completed shifts: In: 480 [P.O.:480] Out: 3350 [Urine:3350]  Intake/Output this shift:  Total I/O In: 600 [P.O.:600] Out: 450 [Urine:450] Weight change: -1.361 kg (-3 lb) Gen: NAD, frail, chronically ill-appearing AAM CVS:no rub NSR Resp:Decreased BS at bases Abd: +BS, soft, NT/ND Ext: 1+ edema  Recent Labs  Lab 03/08/18 1552 03/09/18 0554 03/09/18 1529 03/10/18 0815 03/11/18 0833 03/12/18 0637 03/13/18 0547  NA 141 144 143 142 141 141 142  K 5.7* 5.8* 5.4* 5.2* 4.9 4.3 4.2  CL 121* 122* 121* 118* 113* 112* 107  CO2 14* 13* 16* 16* 16* 20* 21*  GLUCOSE 111* 98 124* 105* 106* 106* 116*  BUN 53* 54* 55* 58* 65* 68* 74*  CREATININE 3.45* 3.48* 3.52* 3.50* 3.36* 3.42* 3.30*  ALBUMIN 2.6*  --   --   --  2.5* 2.4*  --   CALCIUM 8.3* 8.3* 8.3* 8.2* 8.3* 8.2* 8.3*  PHOS  --   --   --   --  3.9 4.1  --   AST 30  --   --   --   --   --   --   ALT 22  --   --   --   --   --   --    Liver Function Tests: Recent Labs  Lab 03/08/18 1552 03/11/18 0833 03/12/18 0637  AST 30  --   --   ALT 22  --   --   ALKPHOS 28*  --   --   BILITOT 1.7*  --   --   PROT 6.5  --   --   ALBUMIN 2.6* 2.5* 2.4*   No results for input(s): LIPASE, AMYLASE in the last 168 hours. No results for input(s): AMMONIA in the last 168 hours. CBC: Recent Labs  Lab 03/08/18 1552 03/09/18 0554  WBC 5.4 5.4  NEUTROABS 3.7  --   HGB 8.7* 8.7*  HCT 28.8* 29.2*  MCV 106.3* 108.6*  PLT 100* 95*   Cardiac Enzymes: Recent Labs  Lab 03/08/18 1552 03/08/18 2350 03/09/18 0554  TROPONINI 0.06* 0.06* 0.05*   CBG: Recent Labs  Lab  03/08/18 1524  GLUCAP 105*    Iron Studies: No results for input(s): IRON, TIBC, TRANSFERRIN, FERRITIN in the last 72 hours. Studies/Results: No results found. [name] atorvastatin  40 mg Oral q1800  . feeding supplement (ENSURE ENLIVE)  237 mL Oral BID BM  . furosemide  80 mg Intravenous BID  . multivitamin with minerals  1 tablet Oral Daily  . warfarin  4.5 mg Oral ONCE-1800  . Warfarin - Pharmacist Dosing Inpatient   Does not apply q1800    BMET    Component Value Date/Time   NA 142 03/13/2018 0547   NA 141 10/26/2017 1507   K 4.2 03/13/2018 0547   CL 107 03/13/2018 0547   CO2 21 (L) 03/13/2018 0547   GLUCOSE 116 (H) 03/13/2018 0547   BUN 74 (H) 03/13/2018 0547   BUN 44 (H) 10/26/2017 1507  CREATININE 3.30 (H) 03/13/2018 0547   CREATININE 1.79 (H) 11/15/2014 1617   CALCIUM 8.3 (L) 03/13/2018 0547   GFRNONAA 16 (L) 03/13/2018 0547   GFRAA 18 (L) 03/13/2018 0547   CBC    Component Value Date/Time   WBC 5.4 03/09/2018 0554   RBC 2.69 (L) 03/09/2018 0554   HGB 8.7 (L) 03/09/2018 0554   HGB 10.5 (L) 03/29/2017 1556   HCT 29.2 (L) 03/09/2018 0554   HCT 32.9 (L) 03/29/2017 1556   PLT 95 (L) 03/09/2018 0554   PLT 104 (L) 03/29/2017 1556   MCV 108.6 (H) 03/09/2018 0554   MCV 99 (H) 03/29/2017 1556   MCH 32.3 03/09/2018 0554   MCHC 29.8 (L) 03/09/2018 0554   RDW 15.7 (H) 03/09/2018 0554   RDW 12.6 03/29/2017 1556   LYMPHSABS 0.6 (L) 03/08/2018 1552   LYMPHSABS 1.4 03/29/2017 1556   MONOABS 0.8 03/08/2018 1552   EOSABS 0.2 03/08/2018 1552   EOSABS 0.5 (H) 03/29/2017 1556   BASOSABS 0.0 03/08/2018 1552   BASOSABS 0.0 03/29/2017 1556     Assessment/Plan:  1. AKI/CKD stage 4 with chronic right hydronephrosis (right kidney 12.9cm with severe parenchymal atrophy, LK 14.7 with increased echogenicity).  Presumably due to cardiorenal syndrome +/- chronic obstruction leading to atrophic renal parenchyma.  Cr has stabilized to slightly improved despite diuresis.  Continue to  follow.  No indication for dialysis at this time. 2. Obstructive uropathy- f/u with Urology 3. Acute on chronic systolic CHF/NICMP EF 20-25% with diffuse hypokinesis.  Diuresing with IV lasix. 4. Atrial fibrillation- rate controlled. On coumadin 5. UTI- on abx 6. Anemia of chronic disease- will check iron stores and consider ESA 7. Deconditioning- per primary 8. Disposition- poor overall prognosis and agree with palliative care consult to help set goals/limits of care.  He is a poor dialysis candidate and has had progressive CKD for the last several years.  Irena Cords, MD [employer] [phone]

## 2018-03-13 NOTE — Consult Note (Signed)
Consultation Note Date: 03/13/2018   Patient Name: Tyler Gray  DOB: [birth year]  MRN: [id number]  Age / Sex: 84 y.o., male  PCP: Lucianne Lei, MD Referring Physician: Patrecia Pour, MD  Reason for Consultation: Establishing goals of care  HPI/Patient Profile: 84 y.o. male  with past medical history of a fib on coumadin, nonischemic cardiomyopathy, chronic systolic CHF s/p biventricular pacemaker (EF 20%), right nephrolithiasis with obstruction, and CKD 3 admitted on 03/08/2018 with weakness, shortness of breath, and BLE edema. Also found to have UTI.Volume overload attributed to CHF, renal failure, and low albumin. Renal function has worsened and renal ultrasound revealed right hydronephrosis. CT confirmed presence of obstructing ureteral and nonobstructing bladder stones which have been there since 2013, per urology. Palliative care consulted for continued goals of care discussions as he is entering into advanced CKD but not felt to be a dialysis candidate.  Clinical Assessment and Goals of Care: I have reviewed medical records including EPIC notes, labs and imaging, assessed the patient and then met at the bedside along with patient's wife, [name],  to discuss diagnosis prognosis, GOC, EOL wishes, disposition and options.  Patient is present, along with wife, for conversation and participates some but also drifts off to sleep during conversation. Unclear how much he understands. Does appear to be oriented. Also, called and spoke with patient's daughter regarding the following.  I introduced Palliative Medicine as specialized medical care for people living with serious illness. It focuses on providing relief from the symptoms and stress of a serious illness. The goal is to improve quality of life for both the patient and the family.  As far as functional and nutritional status, family tells me of a significant decline over the past month. He has become  weaker, requires walker for ambulation, cannot complete ADLs independently, and falling often. Eating okay, frequent periods of confusion.   We discussed his current illness and what it means in the larger context of his on-going co-morbidities.  Natural disease trajectory and expectations at EOL were discussed. Specifically discussed heart and kidney failure.   I attempted to elicit values and goals of care important to the patient.    The difference between aggressive medical intervention and comfort care was considered in light of the patient's goals of care. At this time family would like to continue aggressive medical interventions but understand options are limited. They understand he is a poor candidate for HD.   Advanced directives, concepts specific to code status, artifical feeding and hydration, and rehospitalization were considered and discussed. Discussed code status with patient and wife thoroughly - wife feels patient should be a DNR - understands resuscitation efforts would not fix overarching problems. If he were to arrest, she would wish that he pass peacefully and comfortably without resuscitation efforts. When directly asked patient about this - he continues to state "I've got to think about that, I'll get back to you". Unclear to me if he has capacity to make decisions. If mental status declines further and he is unable to make decisions, wife would be decision maker and she would elect DNR.   Discussed with wife and daughter that patient is at high risk for readmission. They are interested is discharge to rehab with palliative to follow outpatient.  Hospice and Palliative Care services outpatient were explained and offered. We discussed that patient is appropriate for hospice now; however, they would like to pursue rehab first. If patient continues to decline in rehab, may utilize hospice benefit at  that time. Palliative care to follow outpatient and continue goals of care  discussions.   Questions and concerns were addressed. The family was encouraged to call with questions or concerns.   Primary Decision Maker PATIENT? Decision making capacity comes and goes, joined by wife    SUMMARY OF RECOMMENDATIONS   **Discharging MD please write for palliative medicine to follow at rehab in discharge summary** - family is processing difficult news of how sick patient is -wife would elect DNR, patient undecided but unclear if he has decision making capacity -PMT will continue to follow to continue [place] discussions -Order spiritual care consult as wife expresses spiritual concerns  Code Status/Advance Care Planning:  Full code - continue discussions  Symptom Management:   Per primary - denies symptoms currently, comfortable  Additional Recommendations (Limitations, Scope, Preferences):  Full Scope Treatment  Psycho-social/Spiritual:   Desire for further Chaplaincy support:yes  Additional Recommendations: Education on Hospice  Prognosis:   Unable to determine - poor prognosis r/t cardiorenal syndrome  Discharge Planning: [place] for rehab with Palliative care service follow-up      Primary Diagnoses: Present on Admission: . AKI (acute kidney injury) ([place]) . Acute lower UTI . CHRONIC SYSTOLIC HEART FAILURE . Acute systolic CHF (congestive heart failure) ([place]) . Elevated INR . Hyperkalemia . Automatic implantable cardioverter-defibrillator in situ . Chronic kidney disease, stage IV (severe) ([place]) . Paroxysmal atrial fibrillation ([facility]) . Elevated troponin . Malnutrition of moderate degree . Pressure injury of skin   I have reviewed the medical record, interviewed the patient and family, and examined the patient. The following aspects are pertinent.  Past Medical History:  Diagnosis Date  . Acute renal failure (ARF) ([place])   . AICD (automatic cardioverter/defibrillator) present 04/13/2017   [employer]  . AKI (acute kidney  injury) ([place]) 03/08/2018  . Arthritis    "mostly hands & fingers" (03/08/2018)  . Atrial fibrillation ([place])   . CHF (congestive heart failure) ([place])   . Chronic systolic heart failure ([place])   . Falls frequently    "has been falling several times lately" (03/08/2018)  . History of blood transfusion    "for his heart" (03/08/2018)  . History of gout   . History of kidney stones   . Hyperlipidemia   . Hypertension   . Pneumonia    "twice, I think" (03/08/2018)   Social History   Socioeconomic History  . Marital status: Married    Spouse name: Not on file  . Number of children: 2  . Years of education: Not on file  . Highest education level: Not on file  Occupational History  . Occupation: retired Actuary  . Financial resource strain: Not on file  . Food insecurity:    Worry: Not on file    Inability: Not on file  . Transportation needs:    Medical: Not on file    Non-medical: Not on file  Tobacco Use  . Smoking status: Never Smoker  . Smokeless tobacco: Never Used  Substance and Sexual Activity  . Alcohol use: Not Currently    Frequency: Never  . Drug use: Never  . Sexual activity: Not Currently  Lifestyle  . Physical activity:    Days per week: Not on file    Minutes per session: Not on file  . Stress: Not on file  Relationships  . Social connections:    Talks on phone: Not on file    Gets together: Not on file    Attends  religious service: Not on file    Active member of club or organization: Not on file    Attends meetings of clubs or organizations: Not on file    Relationship status: Not on file  Other Topics Concern  . Not on file  Social History Narrative  . Not on file   Family History  Problem Relation Age of Onset  . Heart failure Mother   . Hypertension Mother   . Heart failure Father   . Hypertension Father   . Other Unknown        notable for CHF   Scheduled Meds: . atorvastatin  40 mg Oral q1800  . feeding supplement  (ENSURE ENLIVE)  237 mL Oral BID BM  . furosemide  80 mg Intravenous BID  . multivitamin with minerals  1 tablet Oral Daily  . sodium bicarbonate  1,300 mg Oral TID  . warfarin  4.5 mg Oral ONCE-1800  . Warfarin - Pharmacist Dosing Inpatient   Does not apply q1800   Continuous Infusions: PRN Meds:.acetaminophen, ondansetron **OR** ondansetron (ZOFRAN) IV, polyethylene glycol No Known Allergies Review of Systems  Unable to perform ROS: Mental status change    Physical Exam  Constitutional: He is oriented to person, place, and time. He appears lethargic. He is cooperative. No distress.  HENT:  Head: Normocephalic and atraumatic.  Cardiovascular: Normal rate. An irregular rhythm present.  Pulmonary/Chest: Effort normal.  Clear anteriorly  Abdominal: Soft. Bowel sounds are normal.  Musculoskeletal: He exhibits edema.  Neurological: He is oriented to person, place, and time. He appears lethargic.  Skin: Skin is warm and dry.  Psychiatric: He is slowed.    Vital Signs: BP 108/71 (BP Location: Left Arm)   Pulse 71   Temp 99.1 F (37.3 C) (Oral)   Resp 18   Ht _0  (1.727 m)   Wt 76.7 kg (169 lb)   SpO2 96%   BMI 25.70 kg/m  Pain Scale: 0-10   Pain Score: 0-No pain   SpO2: SpO2: 96 % O2 Device:SpO2: 96 % O2 Flow Rate: .   IO: Intake/output summary:   Intake/Output Summary (Last 24 hours) at 03/13/2018 1400 Last data filed at 03/13/2018 1311 Gross per 24 hour  Intake 600 ml  Output 2150 ml  Net -1550 ml    LBM: Last BM Date: 03/12/18 Baseline Weight: Weight: 73 kg (161 lb) Most recent weight: Weight: 76.7 kg (169 lb)     Palliative Assessment/Data: PPS 40%    Time Total: 90 minutes Greater than 50%  of this time was spent counseling and coordinating care related to the above assessment and plan.  Juel Burrow, DNP, AGNP-C Palliative Medicine Team [phone] Pager: [phone]

## 2018-03-13 NOTE — Progress Notes (Signed)
Pt demonstrating very limited progress with OT goals today. He demonstrates decline in functional mobility status and was unable to power up to squat or stand for functional transfers despite max assist and facilitation techniques today. Session adjusted to focus on preparatory activities to improve independence with ADL and functional transfers with therapeutic exercises and LB dressing tasks (for which pt requires max assist). At current functional level, continue to recommend SNF level rehabilitation to improve pt's ability to participate in ADL and functional mobility. OT will continue to follow while admitted.    03/13/18 1100  OT Visit Information  Last OT Received On 03/13/18  Assistance Needed +1  History of Present Illness Tyler Gray is a 84 y.o. male with medical history significant for nonischemic cardiomyopathy, paroxysmal atrial fibrillation on warfarin, chronic systolic heart failure, AICD, hyperlipidemia, and chronic kidney disease stage III  Precautions  Precautions Fall  Pain Assessment  Pain Assessment No/denies pain  Cognition  Arousal/Alertness Awake/alert  Behavior During Therapy WFL for tasks assessed/performed  Upper Extremity Assessment  Upper Extremity Assessment Generalized weakness (L UE pitting edema)  ADL  Overall ADL's  Needs assistance/impaired  Lower Body Dressing Maximal assistance;Sitting/lateral leans  General ADL Comments Session focused on preparatory activities for improved functional mobility.  Bed Mobility  General bed mobility comments Pt OOB in chair upon arrival  Balance  Overall balance assessment Needs assistance  Sitting-balance support Feet supported;No upper extremity supported  Sitting balance-Leahy Scale Fair  Sitting balance - Comments  Able to sustain seated balance at edge of chair.   Restrictions  Weight Bearing Restrictions No  Transfers  Overall transfer level Needs assistance  General transfer comment Attempted multiple  times to complete sit<>stand or squat from chair. Despite facilitation and max assistance, pt unable to power up to standing. He reports L knee and ankle pain.   General Comments  General comments (skin integrity, edema, etc.) Pt's wife present during session.   Exercises  Exercises Other exercises  Other Exercises  Other Exercises Facilitated B UE AROM in shoulder flexion and abduction x10 each  Other Exercises Facilitated chair push-ups x10 with pt unable to clear hips.   Other Exercises Facilitated BLE therapeutic exercise for long arc quad, ankle pumps, and knee marches with assistance from therapist required. Noted diminished functional ankle and knee AROM.   OT - End of Session  Equipment Utilized During Treatment Gait belt;Rolling walker  Activity Tolerance Patient tolerated treatment well  Patient left in chair;with call bell/phone within reach;with chair alarm set  Nurse Communication Mobility status  OT Assessment/Plan  OT Plan Discharge plan remains appropriate  OT Visit Diagnosis Unsteadiness on feet (R26.81);Repeated falls (R29.6);Muscle weakness (generalized) (M62.81)  OT Frequency (ACUTE ONLY) Min 2X/week  Follow Up Recommendations SNF;Supervision/Assistance - 24 hour  OT Equipment 3 in 1 bedside commode  AM-PAC OT "6 Clicks" Daily Activity Outcome Measure  Help from another person eating meals? 3  Help from another person taking care of personal grooming? 3  Help from another person toileting, which includes using toliet, bedpan, or urinal? 1  Help from another person bathing (including washing, rinsing, drying)? 2  Help from another person to put on and taking off regular upper body clothing? 3  Help from another person to put on and taking off regular lower body clothing? 2  6 Click Score 14  ADL G Code Conversion CK  OT Goal Progression  Progress towards OT goals Not progressing toward goals - comment (decline in functional mobility)  Acute  Rehab OT Goals  Patient  Stated Goal get stronger  OT Goal Formulation With patient  Time For Goal Achievement 03/24/18  Potential to Achieve Goals Good  OT Time Calculation  OT Start Time (ACUTE ONLY) 1145  OT Stop Time (ACUTE ONLY) 1215  OT Time Calculation (min) 30 min  OT Treatments  $Self Care/Home Management  8-22 mins  $Therapeutic Exercise 8-22 mins   Doristine Section, MS OTR/L  Pager: [phone]

## 2018-03-13 NOTE — Progress Notes (Signed)
PROGRESS NOTE  Tyler Gray  GOT:[id number] DOB: 04-02-1933 DOA: 03/08/2018 PCP: Renaye Rakers, MD  Outpatient Specialists: Cardiology, Dr. Verdis Prime Brief Narrative: Tyler Gray is an 84 y.o. male with a history of NICM, chronic systolic CHF s/p BiV pacemaker, PAF on coumadin, stage III CKD who presented with increasing weakness, lower extremity edema and shortness of breath. Pt is difficult historian but appeared volume overloaded. Afebrile, hemodynamically stable. UA with large leuks, > 50 WBC, few bacteria. CMP with K of 5.7 and Cr 3.45 ( baseline 2.3-2.7). BNP > 2200. Troponin slightly elevated at 0.06. Hgb 8.7 down from baseline of 10 otherwise no acute changes on CBC. INR elevated over goal at 3.69. CXR with mild bibasilar atelectasis with stable cardiomegaly and no pulmonary edema. XR of hand with no acute osseous abnormalities, and soft tissue swelling near right 5th MCP joint. CT head/Cervical spine with no acute intracranial abnormalities with cerebral volume loss and chronic small vessel ischemic changes and severe degenerative changes in cervical spine  Lasix was started and cardiology consulted. Lasix has been increased and beta blocker stopped by cardiology. Renal function worsened and renal ultrasound demonstrated right hydronephrosis. CT confirmed presence of obstructing ureteral and nonobstructing bladder stones which, per discussion with urology, have been there since 2013. Nephrology consulted. Remains volume overloaded with echocardiogram showing EF 20-25%. Palliative care consulted for continued goals of care discussions as he is entering into advanced CKD but not felt to be a dialysis candidate.  Assessment & Plan: Active Problems:   Paroxysmal atrial fibrillation (HCC)   CHRONIC SYSTOLIC HEART FAILURE   Chronic kidney disease, stage IV (severe) (HCC)   Automatic implantable cardioverter-defibrillator in situ   Goals of care, counseling/discussion   AKI (acute kidney  injury) (HCC)   Acute lower UTI   Acute systolic CHF (congestive heart failure) (HCC)   Elevated INR   Hyperkalemia   Elevated troponin   Malnutrition of moderate degree   Pressure injury of skin   Palliative care by specialist  Generalized weakness: Multifactorial due to heart failure, possibly also UTI and deconditioning.  - Treat as below - Will benefit from short term SNF.   AKI on stage III CKD: Decreased perfusion from depleted EABV in addition to chronic renal disease and essentially solitary kidney with chronic right ureteral obstruction. Renal U/S revealed a right kidney that is thinned with moderate hydronephrosis and increased echogenicity. No right ureteral jet was seen. Left kidney is 14.7cm with cortical thinning and echogenicity.  - Monitor closely with diuresis.  - Appreciate nephrology recommendations. Discussed with family that the patient would likely not be a good hemodialysis candidate.     Urinary hesitancy: Mildly enlarged prostate on CT. Bladder and ureteral stones as below - Consider trial flomax. Will discuss with urology.  Ureterolithiasis with right hydronephrosis: Known previously and confirmed on CT stone protocol 6/27. Patient and his wife confirm that it was known years ago and the risks of treatment with his cardiac function were felt to prohibit treatment. - No further intervention planned per discussion with urology, Dr. Vernie Ammons, 6/27.  Hyperkalemia: Resolved - Continue telemetry, no ECG changes.   Metabolic acidosis: Due to renal failure - This has improved significantly, will stop bicarbonate unless nephrology feels this is still warranted.  Acute on chronic systolic CHF: s/p ICD.  - Continue IV lasix, no change today. Remains volume overloaded. Fortunately UOP picking up and Cr stable.  - Monitor strict I/O (condom cath), daily weights, BMP - Sodium restriction -  No ACE/ARB/entresto with renal insufficiency/hyperK. Holding beta blocker with  need for diuresis. May need milrinone.  - Echo showed EF 20-25% with akinesis of inferoseptal wall, severe biatrial dilatation with mod-severe MR and severe TR, elevated pulmonary artery pressure. Cardiology recommended palliative care consultation.  Acute lower UTI: UA consistent with infection, culture polyclonal. Stones present, though pt not septic.  - Completed ceftriaxone x3 days  Atrial fibrillation, rate controlled:  - Continue telemetry - Restart coumadin with goal INR 2-3. At goal.  Elevated troponin. Likely demand ischemia.  - No intervention planned per cardiology  Supratherapeutic INR: 3.6 on admission, trended down spontaneously.  - Restart coumadin as above  Irregular liver contour on imaging: Compatible with cirrhosis, though he doesn't carry this diagnosis. Would be compatible with thrombocytopenia, hypoalbuminemia. Appears compensated with normal AST/ALT, no ascites or encephalopathy. INR elevated due to coumadin. No ascites noted.  - Will defer further work up for now.    DVT prophylaxis: Coumadin Code Status: Full Family Communication: None at bedside. Palliative care meeting this morning with wife at bedside. Disposition Plan: SNF recommended by therapy. Uncertain timing of discharge at this point, remains significantly overloaded.  Consultants:   Cardiology, Dr. Jens Som  Nephrology, Dr. Ronalee Belts  Palliative care team  Procedures:   Echocardiogram 03/09/2018:  - Left ventricle: The cavity size was severely dilated. Systolic   function was severely reduced. The estimated ejection fraction   was in the range of 20% to 25%. Diffuse hypokinesis. There is   akinesis of the inferoseptal myocardium. The study is not   technically sufficient to allow evaluation of LV diastolic   function. - Aortic valve: There was mild regurgitation. - Aorta: Aortic root dimension: 43 mm (ED). - Ascending aorta: The ascending aorta was mildly dilated. - Mitral valve: Mildly  thickened leaflets . There was moderate to   severe regurgitation directed posteriorly. - Left atrium: The atrium was severely dilated. - Right ventricle: The cavity size was moderately dilated. Wall   thickness was normal. Pacer wire or catheter noted in right   ventricle. - Right atrium: The atrium was severely dilated. - Tricuspid valve: There was severe regurgitation. - Pulmonary arteries: Systolic pressure was moderately increased.   PA peak pressure: 50 mm Hg (S).  Antimicrobials:  Ceftriaxone 6/26 - 6/28  Subjective: Having worsening urinary hesitancy, feels incomplete emptying which is a new problem. Denies other concerns. No shortness of breath or chest pain or palpitations.   Objective: Vitals:   03/12/18 2122 03/13/18 0335 03/13/18 1001 03/13/18 1118  BP: 103/76  108/61 108/71  Pulse: 79   71  Resp: 18     Temp: 98.2 F (36.8 C)   99.1 F (37.3 C)  TempSrc: Oral   Oral  SpO2: 100%   96%  Weight:  76.7 kg (169 lb)    Height:        Intake/Output Summary (Last 24 hours) at 03/13/2018 1502 Last data filed at 03/13/2018 1311 Gross per 24 hour  Intake 600 ml  Output 2150 ml  Net -1550 ml   Filed Weights   03/11/18 0427 03/12/18 0552 03/13/18 0335  Weight: 77.3 kg (170 lb 8 oz) 78 kg (172 lb) 76.7 kg (169 lb)   Gen: Elderly male in no distress Pulm: Nonlabored breathing room air. Bibasilar decreased. CV: Irreg irreg, no murmur, rub, or gallop. No JVD, 2+ dependent edema. GI: Abdomen soft, non-tender, non-distended, with normoactive bowel sounds.  Ext: Warm, no deformities Skin: No new rashes. Right shin wound  stable. Neuro: Alert and oriented. No focal neurological deficits. Psych: Judgement and insight appear fair. Mood euthymic & affect congruent. Behavior is appropriate.    CBC: Recent Labs  Lab 03/08/18 1552 03/09/18 0554  WBC 5.4 5.4  NEUTROABS 3.7  --   HGB 8.7* 8.7*  HCT 28.8* 29.2*  MCV 106.3* 108.6*  PLT 100* 95*   Basic Metabolic  Panel: Recent Labs  Lab 03/09/18 1529 03/10/18 0815 03/11/18 0833 03/12/18 0637 03/13/18 0547  NA 143 142 141 141 142  K 5.4* 5.2* 4.9 4.3 4.2  CL 121* 118* 113* 112* 107  CO2 16* 16* 16* 20* 21*  GLUCOSE 124* 105* 106* 106* 116*  BUN 55* 58* 65* 68* 74*  CREATININE 3.52* 3.50* 3.36* 3.42* 3.30*  CALCIUM 8.3* 8.2* 8.3* 8.2* 8.3*  PHOS  --   --  3.9 4.1  --    GFR: Estimated Creatinine Clearance: 16.4 mL/min (A) (by C-G formula based on SCr of 3.3 mg/dL (H)). Liver Function Tests: Recent Labs  Lab 03/08/18 1552 03/11/18 0833 03/12/18 0637  AST 30  --   --   ALT 22  --   --   ALKPHOS 28*  --   --   BILITOT 1.7*  --   --   PROT 6.5  --   --   ALBUMIN 2.6* 2.5* 2.4*   No results for input(s): LIPASE, AMYLASE in the last 168 hours. No results for input(s): AMMONIA in the last 168 hours. Coagulation Profile: Recent Labs  Lab 03/09/18 0554 03/10/18 0704 03/11/18 0444 03/12/18 0637 03/13/18 0547  INR 3.83 3.73 3.30 2.64 2.12   Cardiac Enzymes: Recent Labs  Lab 03/08/18 1552 03/08/18 2350 03/09/18 0554  TROPONINI 0.06* 0.06* 0.05*   BNP (last 3 results) Recent Labs    10/14/17 1209  PROBNP 15,115*   HbA1C: No results for input(s): HGBA1C in the last 72 hours. CBG: Recent Labs  Lab 03/08/18 1524  GLUCAP 105*   Lipid Profile: No results for input(s): CHOL, HDL, LDLCALC, TRIG, CHOLHDL, LDLDIRECT in the last 72 hours. Thyroid Function Tests: No results for input(s): TSH, T4TOTAL, FREET4, T3FREE, THYROIDAB in the last 72 hours. Anemia Panel: No results for input(s): VITAMINB12, FOLATE, FERRITIN, TIBC, IRON, RETICCTPCT in the last 72 hours. Urine analysis:    Component Value Date/Time   COLORURINE YELLOW 03/08/2018 1628   APPEARANCEUR CLOUDY (A) 03/08/2018 1628   LABSPEC 1.012 03/08/2018 1628   PHURINE 5.0 03/08/2018 1628   GLUCOSEU NEGATIVE 03/08/2018 1628   HGBUR MODERATE (A) 03/08/2018 1628   BILIRUBINUR NEGATIVE 03/08/2018 1628   KETONESUR  NEGATIVE 03/08/2018 1628   PROTEINUR 100 (A) 03/08/2018 1628   NITRITE NEGATIVE 03/08/2018 1628   LEUKOCYTESUR LARGE (A) 03/08/2018 1628   Recent Results (from the past 240 hour(s))  Urine culture     Status: Abnormal   Collection Time: 03/08/18  5:18 PM  Result Value Ref Range Status   Specimen Description URINE, CLEAN CATCH  Final   Special Requests   Final    NONE Performed at [facility], [address], [place], [place] [address]    Culture MULTIPLE SPECIES PRESENT, SUGGEST RECOLLECTION (A)  Final   Report Status 03/09/2018 FINAL  Final      Radiology Studies: No results found.  Scheduled Meds: . atorvastatin  40 mg Oral q1800  . feeding supplement (ENSURE ENLIVE)  237 mL Oral BID BM  . furosemide  80 mg Intravenous BID  . multivitamin with minerals  1  tablet Oral Daily  . sodium bicarbonate  1,300 mg Oral TID  . warfarin  4.5 mg Oral ONCE-1800  . Warfarin - Pharmacist Dosing Inpatient   Does not apply q1800   Continuous Infusions:    LOS: 5 days   Time spent: 25 minutes.  Tyrone Nine, MD [facility] [email or web address] Password [email or web address] 03/13/2018, 3:02 PM

## 2018-03-13 NOTE — Progress Notes (Addendum)
Progress Note  Patient Name: Tyler Gray Date of Encounter: 03/13/2018  Primary Cardiologist: Lesleigh Noe, MD   Subjective   Reports still feeling poorly with significant weakness. Also with some difficulty urinating. He reports stable breathing and denies CP or palpitations.   Inpatient Medications    Scheduled Meds: . atorvastatin  40 mg Oral q1800  . feeding supplement (ENSURE ENLIVE)  237 mL Oral BID BM  . furosemide  80 mg Intravenous BID  . multivitamin with minerals  1 tablet Oral Daily  . sodium bicarbonate  1,300 mg Oral TID  . warfarin  4.5 mg Oral ONCE-1800  . Warfarin - Pharmacist Dosing Inpatient   Does not apply q1800   Continuous Infusions:  PRN Meds: acetaminophen, ondansetron **OR** ondansetron (ZOFRAN) IV, polyethylene glycol   Vital Signs    Vitals:   03/12/18 1148 03/12/18 2122 03/13/18 0335 03/13/18 1001  BP: 106/64 103/76  108/61  Pulse: (!) 107 79    Resp: 19 18    Temp: 98.8 F (37.1 C) 98.2 F (36.8 C)    TempSrc: Oral Oral    SpO2: 100% 100%    Weight:   169 lb (76.7 kg)   Height:        Intake/Output Summary (Last 24 hours) at 03/13/2018 1046 Last data filed at 03/13/2018 5056 Gross per 24 hour  Intake 420 ml  Output 2200 ml  Net -1780 ml   Filed Weights   03/11/18 0427 03/12/18 0552 03/13/18 0335  Weight: 170 lb 8 oz (77.3 kg) 172 lb (78 kg) 169 lb (76.7 kg)    Telemetry    Paced rhythm with PVCs and underlying atrial fibrillation - Personally Reviewed  Physical Exam   GEN: Ill appearing elderly gentleman sitting upright in bedside chair in no acute distress.   Neck: No JVD, no carotid bruits Cardiac: irregular rate, regular rhythm, no murmurs, rubs, or gallops.  Respiratory: crackles at lung bases  GI: NABS, Soft, nontender, non-distended  Ext: 2-3+ b/l upper and lower extremity edema; No deformity. Neuro:  Nonfocal, moving all extremities spontaneously Psych: Normal affect   Labs    Chemistry Recent Labs    Lab 03/08/18 1552  03/11/18 0833 03/12/18 0637 03/13/18 0547  NA 141   < > 141 141 142  K 5.7*   < > 4.9 4.3 4.2  CL 121*   < > 113* 112* 107  CO2 14*   < > 16* 20* 21*  GLUCOSE 111*   < > 106* 106* 116*  BUN 53*   < > 65* 68* 74*  CREATININE 3.45*   < > 3.36* 3.42* 3.30*  CALCIUM 8.3*   < > 8.3* 8.2* 8.3*  PROT 6.5  --   --   --   --   ALBUMIN 2.6*  --  2.5* 2.4*  --   AST 30  --   --   --   --   ALT 22  --   --   --   --   ALKPHOS 28*  --   --   --   --   BILITOT 1.7*  --   --   --   --   GFRNONAA 15*   < > 16* 15* 16*  GFRAA 17*   < > 18* 18* 18*  ANIONGAP 6   < > 12 9 14    < > = values in this interval not displayed.     Hematology Recent Labs  Lab 03/08/18 1552 03/09/18 0554  WBC 5.4 5.4  RBC 2.71* 2.69*  HGB 8.7* 8.7*  HCT 28.8* 29.2*  MCV 106.3* 108.6*  MCH 32.1 32.3  MCHC 30.2 29.8*  RDW 15.5 15.7*  PLT 100* 95*    Cardiac Enzymes Recent Labs  Lab 03/08/18 1552 03/08/18 2350 03/09/18 0554  TROPONINI 0.06* 0.06* 0.05*   No results for input(s): TROPIPOC in the last 168 hours.   BNP Recent Labs  Lab 03/08/18 1552  BNP 2,285.4*     DDimer No results for input(s): DDIMER in the last 168 hours.   Radiology    No results found.  Cardiac Studies   Echo 03/09/18 Study Conclusions  - Left ventricle: The cavity size was severely dilated. Systolic function was severely reduced. The estimated ejection fraction was in the range of 20% to 25%. Diffuse hypokinesis. There is akinesis of the inferoseptal myocardium. The study is not technically sufficient to allow evaluation of LV diastolic function. - Aortic valve: There was mild regurgitation. - Aorta: Aortic root dimension: 43 mm (ED). - Ascending aorta: The ascending aorta was mildly dilated. - Mitral valve: Mildly thickened leaflets . There was moderate to severe regurgitation directed posteriorly. - Left atrium: The atrium was severely dilated. - Right ventricle: The cavity  size was moderately dilated. Wall thickness was normal. Pacer wire or catheter noted in right ventricle. - Right atrium: The atrium was severely dilated. - Tricuspid valve: There was severe regurgitation. - Pulmonary arteries: Systolic pressure was moderately increased. PA peak pressure: 50 mm Hg (S).    Patient Profile   84 year old male with past medical history of nonischemic cardiomyopathy, atrial fibrillation on chronic Coumadin, chronic systolic congestive heart failure, prior BivICD with nonfunctional atrial lead, chronic stage III kidney disease, being followed by cardiology for acute on chronic systolic congestive heart failure  Assessment & Plan    1. Acute on chronic systolic CHF/ Non-ischemic cardiomyopathy: p/w SOB and LE edema. Echo this admission with EF 20-25% and diffuse hypokinesis. Also with worsening renal function and low albumin which are likely contributing to volume overload. He was started on IV lasix 80mg  BID with UOP net -2.2L in the last 24 hours and -3.4L this admission. Weight 172lbs on admission > 169lbs today. Cr stable at 3.3 today. Still with crackles on exam and 2-3+ edema in b/l upper and lower extremities on exam. Palliative care consulted with plans for GOC discussion today.  - Continue IV lasix 80mg  BID - Unable to add ACEi/ARB due to renal insufficiency - Not on BBlocker on hydralazine due to soft BP's to allow room for diuresis.  - Continue to monitor strict I&Os and daily weights  2. Atrial fibrillation: rate well controlled not on AV nodal blocking agents currently. INR at goal - 2.12 today - Continue coumadin  3. Mild troponin elevation: trop trend 0.06>0.06>0.05, not consistent with ACS - No further ischemic work-up at this time  4. UTI: On IV CTX - Continue management per primary team  5. Acute on chronic renal insuffiencey: Cr stable at 3.3 today - Continue to monitor closely with diuresis  6. PPM: ?failure to sense on  telemetry - Will have device interrogated today - [employer] representative aware.   For questions or updates, please contact [facility] Please consult [email or web address] for contact info under Cardiology/STEMI.      Signed, Beatriz Stallion, PA-C  03/13/2018, 10:46 AM   [phone]  I have seen and examined the patient along with Beatriz Stallion,  PA-C .  I have reviewed the chart, notes and new data.  I agree with PA/NP's note.  Key new complaints: Still severely edematous, but dyspnea improved since admission.   Key examination changes: Anasarca Key new findings / data: On my review of telemetry there is no evidence of pacemaker "malfunction".  He has a biventricular defibrillator that is programmed VVIR.  At his last device check in April he had only 83% biventricular pacing, due to ventricular sensed events. (Permanent atrial fibrillation and nonfunctional atrial lead).  PLAN: We will interrogate his device again, possible that ventricular rate control has deteriorated further after his amiodarone was stopped in late October 2018 and therefore he is getting an even lower percentage of CRT as that medication has gradually worn off (BiV pacing 94% in October, 90% in February, 83% in April). His carvedilol was at a very low dose (3.125 mg BID) due to hypotension and was stopped altogether during this admission. Reluctant to use digoxin due to severe renal dysfunction. Restarting amiodarone as a rate control drug may be our best option. I believe the medication was stopped due to futility of maintaining NSR, not due to side effects.  Thurmon Fair, MD, [facility] [phone] 03/13/2018, 11:56 AM

## 2018-03-13 NOTE — Progress Notes (Signed)
ANTICOAGULATION CONSULT NOTE   Pharmacy Consult for Warfarin Indication: atrial fibrillation  No Known Allergies  Labs: Recent Labs    03/11/18 0444 03/11/18 0833 03/12/18 0637 03/13/18 0547  LABPROT 33.3*  --  28.0* 23.6*  INR 3.30  --  2.64 2.12  CREATININE  --  3.36* 3.42* 3.30*   Assessment: 84 yo male admitted for generalized weakness. On Warfarin PTA for Atrial fibrillation.  On admission, INR was supratherapeutic at 3.6. Warfarin held for the last few days until INR came down to the therapeutic range, and Warfarin resumed.  His INR today is within goal range at 2.12  Home Warfarin regimen:  3 mg alternating with 4.5 mg every other day. Labs Reviewed:  No CBC today - will obtain one in AM Meds Reviewed:  No current meds that would potentially interact  Goal of Therapy:  INR 2-3   Plan:  Warfarin 4.5 mg this evening. Daily H&H and INR, redosing the Warfarin accordingly.  Nadara Mustard, PharmD., MS Clinical Pharmacist Pager:  [phone] Thank you for allowing pharmacy to be part of this patients care team. 03/13/2018,9:21 AM

## 2018-03-13 NOTE — Clinical Social Work Note (Addendum)
[facility] is not in network with patient's insurance. [facility] will review referral. [facility] has already made a bed offer.  Charlynn Court, CSW [phone]  3:02 pm [facility] has offered a bed. CSW called patient's wife to notify. She has accepted [facility]. CSW paged MD to see if there is an anticipated discharge date so insurance authorization can be started if needed.  Charlynn Court, CSW [phone]  3:29 pm MD note says discharge date uncertain due to patient still being significantly volume overloaded. Please notify CSW at least 24 hours prior to discharge so insurance authorization can be started.  Charlynn Court, CSW [phone]

## 2018-03-13 NOTE — Progress Notes (Signed)
Interrogation of CRT-D device shows normal ventricular lead and battery parameters and no serious ventricular arrhythmia. Increased mean ventricular rate. BiV pacing efficiency has decreased further to 80% for the May-June period, but is probably only 50-60% over the last week, due to increased ventricular rate with atrial fibrillation. He needs better rate control to receive benefit from CRT. Will restart amiodarone. Briefly reviewed with Dr. Elberta Fortis.  Thurmon Fair, MD, [facility] [phone] office [phone] pager

## 2018-03-14 DIAGNOSIS — N179 Acute kidney failure, unspecified: Secondary | ICD-10-CM

## 2018-03-14 DIAGNOSIS — N189 Chronic kidney disease, unspecified: Secondary | ICD-10-CM

## 2018-03-14 LAB — URINALYSIS, ROUTINE W REFLEX MICROSCOPIC
Bilirubin Urine: NEGATIVE
Glucose, UA: NEGATIVE mg/dL
KETONES UR: NEGATIVE mg/dL
Nitrite: NEGATIVE
PROTEIN: NEGATIVE mg/dL
Specific Gravity, Urine: 1.009 (ref 1.005–1.030)
WBC, UA: >50 WBC/hpf — ABNORMAL HIGH (ref 0–5)
pH: 5 (ref 5.0–8.0)

## 2018-03-14 LAB — CBC WITH DIFFERENTIAL/PLATELET
Abs Immature Granulocytes: 0.1 10*3/uL (ref 0.0–0.1)
Basophils Absolute: 0 10*3/uL (ref 0.0–0.1)
Basophils Relative: 0 %
EOS PCT: 0 %
Eosinophils Absolute: 0 10*3/uL (ref 0.0–0.7)
HEMATOCRIT: 26.5 % — AB (ref 39.0–52.0)
Hemoglobin: 8.5 g/dL — ABNORMAL LOW (ref 13.0–17.0)
Immature Granulocytes: 1 %
LYMPHS ABS: 0.7 10*3/uL (ref 0.7–4.0)
LYMPHS PCT: 5 %
MCH: 32 pg (ref 26.0–34.0)
MCHC: 32.1 g/dL (ref 30.0–36.0)
MCV: 99.6 fL (ref 78.0–100.0)
MONO ABS: 1.8 10*3/uL — AB (ref 0.1–1.0)
MONOS PCT: 13 %
Neutro Abs: 11.5 10*3/uL — ABNORMAL HIGH (ref 1.7–7.7)
Neutrophils Relative %: 81 %
Platelets: 105 10*3/uL — ABNORMAL LOW (ref 150–400)
RBC: 2.66 MIL/uL — ABNORMAL LOW (ref 4.22–5.81)
RDW: 15.5 % (ref 11.5–15.5)
WBC: 14 10*3/uL — ABNORMAL HIGH (ref 4.0–10.5)

## 2018-03-14 LAB — PROTIME-INR
INR: 2.11
PROTHROMBIN TIME: 23.5 s — AB (ref 11.4–15.2)

## 2018-03-14 LAB — BASIC METABOLIC PANEL
Anion gap: 8 (ref 5–15)
BUN: 82 mg/dL — ABNORMAL HIGH (ref 8–23)
CALCIUM: 7.7 mg/dL — AB (ref 8.9–10.3)
CHLORIDE: 109 mmol/L (ref 98–111)
CO2: 23 mmol/L (ref 22–32)
CREATININE: 3.41 mg/dL — AB (ref 0.61–1.24)
GFR calc non Af Amer: 15 mL/min — ABNORMAL LOW (ref >60)
GFR, EST AFRICAN AMERICAN: 18 mL/min — AB (ref >60)
Glucose, Bld: 121 mg/dL — ABNORMAL HIGH (ref 70–99)
Potassium: 4.1 mmol/L (ref 3.5–5.1)
SODIUM: 140 mmol/L (ref 135–145)

## 2018-03-14 LAB — IRON AND TIBC
IRON: 24 ug/dL — AB (ref 45–182)
Saturation Ratios: 12 % — ABNORMAL LOW (ref 17.9–39.5)
TIBC: 200 ug/dL — ABNORMAL LOW (ref 250–450)
UIBC: 176 ug/dL

## 2018-03-14 LAB — FERRITIN: FERRITIN: 226 ng/mL (ref 24–336)

## 2018-03-14 MED ORDER — ENSURE ENLIVE PO LIQD
237.0000 mL | Freq: Three times a day (TID) | ORAL | Status: DC
  Administered 2018-03-14 – 2018-03-22 (×19): 237 mL via ORAL

## 2018-03-14 MED ORDER — WARFARIN SODIUM 3 MG PO TABS
3.0000 mg | ORAL_TABLET | Freq: Once | ORAL | Status: AC
  Administered 2018-03-14: 3 mg via ORAL
  Filled 2018-03-14: qty 1

## 2018-03-14 MED ORDER — SODIUM CHLORIDE 0.9 % IV SOLN
1.0000 g | INTRAVENOUS | Status: AC
  Administered 2018-03-14 – 2018-03-16 (×3): 1 g via INTRAVENOUS
  Filled 2018-03-14 (×3): qty 10

## 2018-03-14 NOTE — Plan of Care (Signed)
  Problem: Nutrition: Goal: Adequate nutrition will be maintained Outcome: Progressing   Problem: Pain Managment: Goal: General experience of comfort will improve Outcome: Progressing   

## 2018-03-14 NOTE — Progress Notes (Signed)
   03/14/18 1100  Clinical Encounter Type  Visited With Patient  Visit Type Follow-up  Referral From Nurse  Consult/Referral To Chaplain  Spiritual Encounters  Spiritual Needs Emotional;Prayer  Stress Factors  Patient Stress Factors Health changes  Chaplain visited with the patient for prayer request.  The PT and Chaplain both talked about societal issues affecting the lives of men.  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

## 2018-03-14 NOTE — Progress Notes (Signed)
ANTICOAGULATION CONSULT NOTE   Pharmacy Consult for Warfarin Indication: atrial fibrillation  No Known Allergies  Labs: Recent Labs    03/12/18 0637 03/13/18 0547 03/14/18 0507  HGB  --   --  8.5*  HCT  --   --  26.5*  PLT  --   --  105*  LABPROT 28.0* 23.6* 23.5*  INR 2.64 2.12 2.11  CREATININE 3.42* 3.30* 3.41*   Assessment: 84 yo male admitted for generalized weakness. On Warfarin PTA for Atrial fibrillation.  On admission, INR was supratherapeutic at 3.6. Warfarin held for the last few days until INR came down to the therapeutic range, and Warfarin resumed.    INR Today: 2.11, CBC stable  Home Warfarin regimen:  3 mg alternating with 4.5 mg every other day.  Meds Reviewed: Interaction with amiodarone started on 6/30  Goal of Therapy:  INR 2-3   Plan:  Warfarin 3 mg this evening. Daily H&H and INR, redosing the Warfarin accordingly.  Ruben Im, PharmD Clinical Pharmacist 03/14/2018 10:05 AM Please check AMION for all [facility] numbers

## 2018-03-14 NOTE — Progress Notes (Addendum)
PROGRESS NOTE  Tyler Gray  ZOX:[id number] DOB: [birth year] DOA: 03/08/2018 PCP: Renaye Rakers, MD  Outpatient Specialists: Cardiology, Dr. Verdis Prime Brief Narrative: Tyler Gray is an 84 y.o. male with a history of NICM, chronic systolic CHF s/p BiV pacemaker, PAF on coumadin, stage III CKD who presented with increasing weakness, lower extremity edema and shortness of breath. Pt is difficult historian but appeared volume overloaded. Afebrile, hemodynamically stable. UA with large leuks, > 50 WBC, few bacteria. CMP with K of 5.7 and Cr 3.45 ( baseline 2.3-2.7). BNP > 2200. Troponin slightly elevated at 0.06. Hgb 8.7 down from baseline of 10 otherwise no acute changes on CBC. INR elevated over goal at 3.69. CXR with mild bibasilar atelectasis with stable cardiomegaly and no pulmonary edema. XR of hand with no acute osseous abnormalities, and soft tissue swelling near right 5th MCP joint. CT head/Cervical spine with no acute intracranial abnormalities with cerebral volume loss and chronic small vessel ischemic changes and severe degenerative changes in cervical spine  Lasix was started and cardiology consulted. Lasix has been increased and beta blocker stopped by cardiology. Renal function worsened and renal ultrasound demonstrated right hydronephrosis. CT confirmed presence of obstructing ureteral and nonobstructing bladder stones which, per discussion with urology, have been there since 2013. Nephrology consulted. Remains volume overloaded with echocardiogram showing EF 20-25%. Palliative care consulted for continued goals of care discussions as he is entering into advanced CKD but not felt to be a dialysis candidate.  Assessment & Plan: Active Problems:   Paroxysmal atrial fibrillation (HCC)   CHRONIC SYSTOLIC HEART FAILURE   Chronic kidney disease, stage IV (severe) (HCC)   Automatic implantable cardioverter-defibrillator in situ   Goals of care, counseling/discussion   AKI (acute kidney  injury) (HCC)   Acute lower UTI   Acute systolic CHF (congestive heart failure) (HCC)   Elevated INR   Hyperkalemia   Elevated troponin   Malnutrition of moderate degree   Pressure injury of skin   Palliative care by specialist  Generalized weakness: Multifactorial due to heart failure, possibly also UTI and deconditioning.  - Treat as below - Will benefit from short term SNF.   AKI on stage III CKD: Decreased perfusion from depleted EABV in addition to chronic renal disease and essentially solitary kidney with chronic right ureteral obstruction. Renal U/S revealed a right kidney that is thinned with moderate hydronephrosis and increased echogenicity. No right ureteral jet was seen. Left kidney is 14.7cm with cortical thinning and echogenicity.  - Monitor closely with diuresis. Creatinine stable and UOP seems to be improving.  - Appreciate nephrology recommendations. Discussed with family that the patient would likely not be a good hemodialysis candidate.  - SPEP, light chains sent per nephrology    Urinary hesitancy: Mildly enlarged prostate on CT. Bladder and ureteral stones as below - Consider trial flomax. Will discuss with urology.  Ureterolithiasis with right hydronephrosis: Known previously and confirmed on CT stone protocol 6/27. Patient and his wife confirm that it was known years ago and the risks of treatment with his cardiac function were felt to prohibit treatment. - No further intervention planned per discussion with urology, Dr. Vernie Ammons, 6/27.  Hyperkalemia: Resolved - Continue telemetry, no ECG changes.   Metabolic acidosis: Due to renal failure - This has improved significantly, will stop bicarbonate unless nephrology feels this is still warranted.  Acute on chronic systolic CHF: s/p ICD.  - Continue lasix, remains volume overloaded. Defer changes in diuretics to cardiology/nephrology.  - Monitor strict  I/O (condom cath), daily weights, BMP - Sodium restriction -  No ACE/ARB/entresto with renal insufficiency/hyperK. Holding beta blocker with need for diuresis. Previously thought may require inotrope. - Echo showed EF 20-25% with akinesis of inferoseptal wall, severe biatrial dilatation with mod-severe MR and severe TR, elevated pulmonary artery pressure. Cardiology recommended palliative care consultation.  Leukocytosis: Unclear cause, afebrile. LE wound without significant cellulitis/abscess. No new steroids - Recheck UA and culture, may need longer course of abx. Restart CTX. - Trend CBC  Acute lower UTI: UA consistent with infection, culture polyclonal. Stones present, though pt not septic.  - Completed ceftriaxone x3 days  Atrial fibrillation, rate controlled: CRT-D interrogation 7/1 with no serious ventricular arrhythmias but increased rate which has decreased pacing efficiency.  - Cardiology restarted amiodarone. - Continue telemetry - Restart coumadin with goal INR 2-3. At goal.  Elevated troponin. Likely demand ischemia.  - No intervention planned per cardiology  Supratherapeutic INR: 3.6 on admission, trended down spontaneously.  - Restarted coumadin as above  Irregular liver contour on imaging: Compatible with cirrhosis, though he doesn't carry this diagnosis. Would be compatible with thrombocytopenia, hypoalbuminemia. Appears compensated with normal AST/ALT, no ascites or encephalopathy. INR elevated due to coumadin. No ascites noted.  - Will defer further work up for now.    DVT prophylaxis: Coumadin Code Status: Full Family Communication: None at bedside. Palliative care meeting this morning with wife at bedside. Disposition Plan: SNF recommended by therapy. Uncertain timing of discharge at this point, remains significantly overloaded. Possibly 7/4-7/5?  Consultants:   Cardiology, Dr. Jens Som  Nephrology, Dr. Ronalee Belts  Palliative care team  Procedures:   Echocardiogram 03/09/2018:  - Left ventricle: The cavity size was  severely dilated. Systolic   function was severely reduced. The estimated ejection fraction   was in the range of 20% to 25%. Diffuse hypokinesis. There is   akinesis of the inferoseptal myocardium. The study is not   technically sufficient to allow evaluation of LV diastolic   function. - Aortic valve: There was mild regurgitation. - Aorta: Aortic root dimension: 43 mm (ED). - Ascending aorta: The ascending aorta was mildly dilated. - Mitral valve: Mildly thickened leaflets . There was moderate to   severe regurgitation directed posteriorly. - Left atrium: The atrium was severely dilated. - Right ventricle: The cavity size was moderately dilated. Wall   thickness was normal. Pacer wire or catheter noted in right   ventricle. - Right atrium: The atrium was severely dilated. - Tricuspid valve: There was severe regurgitation. - Pulmonary arteries: Systolic pressure was moderately increased.   PA peak pressure: 50 mm Hg (S).  Antimicrobials:  Ceftriaxone 6/26 - 6/28  Subjective: Feels a bit better today, swelling has improved modestly. Reports some weakness with movement, worse than baseline, also gets winded with exertion.  Objective: Vitals:   03/13/18 1118 03/13/18 1728 03/13/18 1944 03/14/18 0557  BP: 108/71 96/67 (!) 94/56 (!) 90/59  Pulse: 71 63 64 73  Resp:   17 20  Temp: 99.1 F (37.3 C)  98.6 F (37 C) 99.1 F (37.3 C)  TempSrc: Oral  Oral Oral  SpO2: 96%  98% 100%  Weight:    77.1 kg (170 lb)  Height:        Intake/Output Summary (Last 24 hours) at 03/14/2018 1111 Last data filed at 03/14/2018 1001 Gross per 24 hour  Intake 520 ml  Output 1625 ml  Net -1105 ml   Filed Weights   03/12/18 0552 03/13/18 0335 03/14/18 0557  Weight: 78 kg (172 lb) 76.7 kg (169 lb) 77.1 kg (170 lb)   Gen: Elderly male in no distress Pulm: Nonlabored breathing room air. Bibasilar decreased with crackles. CV: Irreg irreg. No murmur, rub, or gallop. No JVD, 2+ pitting dependent  edema. GI: Abdomen soft, non-tender, non-distended, with normoactive bowel sounds.  Ext: Warm, no deformities Skin: Right shin wound with serous, nonmalodorous drainage.  Neuro: Alert and oriented. No focal neurological deficits. Psych: Judgement and insight appear fair. Mood euthymic & affect congruent. Behavior is appropriate.    CBC: Recent Labs  Lab 03/08/18 1552 03/09/18 0554 03/14/18 0507  WBC 5.4 5.4 14.0*  NEUTROABS 3.7  --  11.5*  HGB 8.7* 8.7* 8.5*  HCT 28.8* 29.2* 26.5*  MCV 106.3* 108.6* 99.6  PLT 100* 95* 105*   Basic Metabolic Panel: Recent Labs  Lab 03/10/18 0815 03/11/18 0833 03/12/18 0637 03/13/18 0547 03/14/18 0507  NA 142 141 141 142 140  K 5.2* 4.9 4.3 4.2 4.1  CL 118* 113* 112* 107 109  CO2 16* 16* 20* 21* 23  GLUCOSE 105* 106* 106* 116* 121*  BUN 58* 65* 68* 74* 82*  CREATININE 3.50* 3.36* 3.42* 3.30* 3.41*  CALCIUM 8.2* 8.3* 8.2* 8.3* 7.7*  PHOS  --  3.9 4.1  --   --    GFR: Estimated Creatinine Clearance: 15.9 mL/min (A) (by C-G formula based on SCr of 3.41 mg/dL (H)). Liver Function Tests: Recent Labs  Lab 03/08/18 1552 03/11/18 0833 03/12/18 0637  AST 30  --   --   ALT 22  --   --   ALKPHOS 28*  --   --   BILITOT 1.7*  --   --   PROT 6.5  --   --   ALBUMIN 2.6* 2.5* 2.4*   No results for input(s): LIPASE, AMYLASE in the last 168 hours. No results for input(s): AMMONIA in the last 168 hours. Coagulation Profile: Recent Labs  Lab 03/10/18 0704 03/11/18 0444 03/12/18 0637 03/13/18 0547 03/14/18 0507  INR 3.73 3.30 2.64 2.12 2.11   Cardiac Enzymes: Recent Labs  Lab 03/08/18 1552 03/08/18 2350 03/09/18 0554  TROPONINI 0.06* 0.06* 0.05*   BNP (last 3 results) Recent Labs    10/14/17 1209  PROBNP 15,115*   HbA1C: No results for input(s): HGBA1C in the last 72 hours. CBG: Recent Labs  Lab 03/08/18 1524  GLUCAP 105*   Lipid Profile: No results for input(s): CHOL, HDL, LDLCALC, TRIG, CHOLHDL, LDLDIRECT in the last  72 hours. Thyroid Function Tests: No results for input(s): TSH, T4TOTAL, FREET4, T3FREE, THYROIDAB in the last 72 hours. Anemia Panel: Recent Labs    03/14/18 0507  FERRITIN 226  TIBC 200*  IRON 24*   Urine analysis:    Component Value Date/Time   COLORURINE YELLOW 03/08/2018 1628   APPEARANCEUR CLOUDY (A) 03/08/2018 1628   LABSPEC 1.012 03/08/2018 1628   PHURINE 5.0 03/08/2018 1628   GLUCOSEU NEGATIVE 03/08/2018 1628   HGBUR MODERATE (A) 03/08/2018 1628   BILIRUBINUR NEGATIVE 03/08/2018 1628   KETONESUR NEGATIVE 03/08/2018 1628   PROTEINUR 100 (A) 03/08/2018 1628   NITRITE NEGATIVE 03/08/2018 1628   LEUKOCYTESUR LARGE (A) 03/08/2018 1628   Recent Results (from the past 240 hour(s))  Urine culture     Status: Abnormal   Collection Time: 03/08/18  5:18 PM  Result Value Ref Range Status   Specimen Description URINE, CLEAN CATCH  Final   Special Requests   Final    NONE Performed at [facility]  [facility], [address], [place], [place] [address]    Culture MULTIPLE SPECIES PRESENT, SUGGEST RECOLLECTION (A)  Final   Report Status 03/09/2018 FINAL  Final      Radiology Studies: No results found.  Scheduled Meds: . amiodarone  400 mg Oral Daily  . atorvastatin  40 mg Oral q1800  . feeding supplement (ENSURE ENLIVE)  237 mL Oral BID BM  . furosemide  80 mg Intravenous BID  . multivitamin with minerals  1 tablet Oral Daily  . warfarin  3 mg Oral ONCE-1800  . Warfarin - Pharmacist Dosing Inpatient   Does not apply q1800   Continuous Infusions:    LOS: 6 days   Time spent: 25 minutes.  Tyrone Nine, MD [facility] [email or web address] Password [email or web address] 03/14/2018, 11:11 AM

## 2018-03-14 NOTE — Progress Notes (Signed)
ANTICOAGULATION CONSULT NOTE   Pharmacy Consult for Warfarin Indication: atrial fibrillation  No Known Allergies  Labs: Recent Labs    03/12/18 0637 03/13/18 0547 03/14/18 0507  HGB  --   --  8.5*  HCT  --   --  26.5*  PLT  --   --  105*  LABPROT 28.0* 23.6* 23.5*  INR 2.64 2.12 2.11  CREATININE 3.42* 3.30* 3.41*   Assessment: 84 yo male admitted for generalized weakness. On Warfarin PTA for Atrial fibrillation.  On admission, INR was supratherapeutic at 3.6. Warfarin held for the last few days until INR came down to the therapeutic range, and Warfarin resumed.    INR Today: 2.11, CBC stable  Home Warfarin regimen:  3 mg alternating with 4.5 mg every other day.  Meds Reviewed:  No current meds that would potentially interact  Goal of Therapy:  INR 2-3   Plan:  Warfarin 3 mg this evening. Daily H&H and INR, redosing the Warfarin accordingly.  Ruben Im, PharmD Clinical Pharmacist 03/14/2018 10:03 AM Please check AMION for all [facility] numbers

## 2018-03-14 NOTE — Progress Notes (Addendum)
Nutrition Follow-up  DOCUMENTATION CODES:   Non-severe (moderate) malnutrition in context of chronic illness  INTERVENTION:   -Increase Ensure Enlive po to TID, each supplement provides 350 kcal and 20 grams of protein -Continue MVI with minerals daily  NUTRITION DIAGNOSIS:   Moderate Malnutrition related to chronic illness(CHF) as evidenced by energy intake < 75% for > or equal to 1 month, mild fat depletion, mild muscle depletion, moderate muscle depletion.  Ongoing  GOAL:   Patient will meet greater than or equal to 90% of their needs  Progressing  MONITOR:   PO intake, Supplement acceptance, Labs, Weight trends, Skin, I & O's  REASON FOR ASSESSMENT:   Malnutrition Screening Tool    ASSESSMENT:   Tyler Gray is a 84 y.o. male with medical history significant for nonischemic cardiomyopathy, paroxysmal atrial fibrillation on warfarin, chronic systolic heart failure, AICD, hyperlipidemia, and chronic kidney disease stage III  Pt sitting in recliner chair at time of visit. Pt main complaint is that he is cold; RD adjusted thermostat in pt room per his request.   Reviewed I/O's: -715 ml x 24 hours and -4.1 L since admission  Pt reports continued poor appetite; meal completion 25-75%. He did not eat much breakfast this morning per his report. He has been consuming mostly peaches and applesauce. Per pt, "everything else taste old- like it's been sitting there awhile". Pt is enjoying Ensure supplement- consumed 100% of strawberry flavor this morning. Pt shares he will continue to consume Ensure as long as it's provided to him.   Palliative care following; pt making minimal progress and is a poor HD candidate per nephrology. Pt family struggling with goals of care.   Labs reviewed.   Diet Order:   Diet Order           Diet 2 gram sodium Room service appropriate? Yes; Fluid consistency: Thin  Diet effective now          EDUCATION NEEDS:   Education needs have been  addressed  Skin:  Skin Assessment: Skin Integrity Issues: Skin Integrity Issues:: Other (Comment) Other: burm to rt lateral leg from space heater  Last BM:  03/12/18  Height:   Ht Readings from Last 1 Encounters:  03/08/18 5\' 8"  (1.727 m)    Weight:   Wt Readings from Last 1 Encounters:  03/14/18 170 lb (77.1 kg)    Ideal Body Weight:  70 kg  BMI:  Body mass index is 25.85 kg/m.  Estimated Nutritional Needs:   Kcal:  1750-1950  Protein:  85-100 grams  Fluid:  1.7-1.9 L    Jayln Branscom A. Mayford Knife, RD, LDN, CDE Pager: [phone] After hours Pager: [phone]

## 2018-03-14 NOTE — Progress Notes (Signed)
S: No new complaints O:BP 100/70 (BP Location: Right Arm)   Pulse 72   Temp 99 F (37.2 C) (Oral)   Resp 12   Ht 5\' 8"  (1.727 m)   Wt 77.1 kg (170 lb)   SpO2 100%   BMI 25.85 kg/m   Intake/Output Summary (Last 24 hours) at 03/14/2018 1153 Last data filed at 03/14/2018 1001 Gross per 24 hour  Intake 520 ml  Output 1625 ml  Net -1105 ml   Intake/Output: I/O last 3 completed shifts: In: 760 [P.O.:760] Out: 2975 [Urine:2975]  Intake/Output this shift:  Total I/O In: 120 [P.O.:120] Out: 250 [Urine:250] Weight change: 0.454 kg (1 lb) Gen: frail elderly AAM in NAD CVS: no rub Resp: cta Abd: benign Ext: no edema  Recent Labs  Lab 03/08/18 1552 03/09/18 0554 03/09/18 1529 03/10/18 0815 03/11/18 0833 03/12/18 0637 03/13/18 0547 03/14/18 0507  NA 141 144 143 142 141 141 142 140  K 5.7* 5.8* 5.4* 5.2* 4.9 4.3 4.2 4.1  CL 121* 122* 121* 118* 113* 112* 107 109  CO2 14* 13* 16* 16* 16* 20* 21* 23  GLUCOSE 111* 98 124* 105* 106* 106* 116* 121*  BUN 53* 54* 55* 58* 65* 68* 74* 82*  CREATININE 3.45* 3.48* 3.52* 3.50* 3.36* 3.42* 3.30* 3.41*  ALBUMIN 2.6*  --   --   --  2.5* 2.4*  --   --   CALCIUM 8.3* 8.3* 8.3* 8.2* 8.3* 8.2* 8.3* 7.7*  PHOS  --   --   --   --  3.9 4.1  --   --   AST 30  --   --   --   --   --   --   --   ALT 22  --   --   --   --   --   --   --    Liver Function Tests: Recent Labs  Lab 03/08/18 1552 03/11/18 0833 03/12/18 0637  AST 30  --   --   ALT 22  --   --   ALKPHOS 28*  --   --   BILITOT 1.7*  --   --   PROT 6.5  --   --   ALBUMIN 2.6* 2.5* 2.4*   No results for input(s): LIPASE, AMYLASE in the last 168 hours. No results for input(s): AMMONIA in the last 168 hours. CBC: Recent Labs  Lab 03/08/18 1552 03/09/18 0554 03/14/18 0507  WBC 5.4 5.4 14.0*  NEUTROABS 3.7  --  11.5*  HGB 8.7* 8.7* 8.5*  HCT 28.8* 29.2* 26.5*  MCV 106.3* 108.6* 99.6  PLT 100* 95* 105*   Cardiac Enzymes: Recent Labs  Lab 03/08/18 1552 03/08/18 2350  03/09/18 0554  TROPONINI 0.06* 0.06* 0.05*   CBG: Recent Labs  Lab 03/08/18 1524  GLUCAP 105*    Iron Studies:  Recent Labs    03/14/18 0507  IRON 24*  TIBC 200*  FERRITIN 226   Studies/Results: No results found. [name] amiodarone  400 mg Oral Daily  . atorvastatin  40 mg Oral q1800  . feeding supplement (ENSURE ENLIVE)  237 mL Oral BID BM  . furosemide  80 mg Intravenous BID  . multivitamin with minerals  1 tablet Oral Daily  . warfarin  3 mg Oral ONCE-1800  . Warfarin - Pharmacist Dosing Inpatient   Does not apply q1800    BMET    Component Value Date/Time   NA 140 03/14/2018 0507   NA 141 10/26/2017 1507  K 4.1 03/14/2018 0507   CL 109 03/14/2018 0507   CO2 23 03/14/2018 0507   GLUCOSE 121 (H) 03/14/2018 0507   BUN 82 (H) 03/14/2018 0507   BUN 44 (H) 10/26/2017 1507   CREATININE 3.41 (H) 03/14/2018 0507   CREATININE 1.79 (H) 11/15/2014 1617   CALCIUM 7.7 (L) 03/14/2018 0507   GFRNONAA 15 (L) 03/14/2018 0507   GFRAA 18 (L) 03/14/2018 0507   CBC    Component Value Date/Time   WBC 14.0 (H) 03/14/2018 0507   RBC 2.66 (L) 03/14/2018 0507   HGB 8.5 (L) 03/14/2018 0507   HGB 10.5 (L) 03/29/2017 1556   HCT 26.5 (L) 03/14/2018 0507   HCT 32.9 (L) 03/29/2017 1556   PLT 105 (L) 03/14/2018 0507   PLT 104 (L) 03/29/2017 1556   MCV 99.6 03/14/2018 0507   MCV 99 (H) 03/29/2017 1556   MCH 32.0 03/14/2018 0507   MCHC 32.1 03/14/2018 0507   RDW 15.5 03/14/2018 0507   RDW 12.6 03/29/2017 1556   LYMPHSABS 0.7 03/14/2018 0507   LYMPHSABS 1.4 03/29/2017 1556   MONOABS 1.8 (H) 03/14/2018 0507   EOSABS 0.0 03/14/2018 0507   EOSABS 0.5 (H) 03/29/2017 1556   BASOSABS 0.0 03/14/2018 0507   BASOSABS 0.0 03/29/2017 1556      Assessment/Plan:  1. AKI/CKD stage 4 with chronic right hydronephrosis (right kidney 12.9cm with severe parenchymal atrophy, LK 14.7 with increased echogenicity).  Presumably due to cardiorenal syndrome +/- chronic obstruction leading to atrophic  renal parenchyma.  Cr has stabilized to slightly improved despite diuresis.  Continue to follow.  No indication for dialysis at this time. 2. Obstructive uropathy- f/u with Urology 3. Acute on chronic systolic CHF/NICMP EF 20-25% with diffuse hypokinesis.  Diuresing with IV lasix. 4. Atrial fibrillation- rate controlled. On coumadin 5. UTI- on abx 6. Anemia of chronic disease- will check iron stores and consider ESA 7. Deconditioning- per primary 8. Disposition- poor overall prognosis and agree with palliative care consult to help set goals/limits of care.  He is a poor dialysis candidate and has had progressive CKD for the last several years.   Irena Cords, MD [employer] [phone]

## 2018-03-14 NOTE — Progress Notes (Signed)
Progress Note  Patient Name: Tyler Gray Date of Encounter: 03/14/2018  Primary Cardiologist: Lesleigh Noe, MD   Subjective   Feeling better.  Denies dyspnea at rest.  Has a good appetite.  Edema substantially decreased and has some wrinkling in his lower extremities now.  Less oozing from the right shin. Over 4 L net diuresis.  Surprisingly weight is not significantly changed.  Inpatient Medications    Scheduled Meds: . amiodarone  400 mg Oral Daily  . atorvastatin  40 mg Oral q1800  . feeding supplement (ENSURE ENLIVE)  237 mL Oral TID BM  . furosemide  80 mg Intravenous BID  . multivitamin with minerals  1 tablet Oral Daily  . warfarin  3 mg Oral ONCE-1800  . Warfarin - Pharmacist Dosing Inpatient   Does not apply q1800   Continuous Infusions: . cefTRIAXone (ROCEPHIN)  IV     PRN Meds: acetaminophen, ondansetron **OR** ondansetron (ZOFRAN) IV, polyethylene glycol   Vital Signs    Vitals:   03/13/18 1728 03/13/18 1944 03/14/18 0557 03/14/18 1150  BP: 96/67 (!) 94/56 (!) 90/59 100/70  Pulse: 63 64 73 72  Resp:  17 20 12   Temp:  98.6 F (37 C) 99.1 F (37.3 C) 99 F (37.2 C)  TempSrc:  Oral Oral Oral  SpO2:  98% 100% 100%  Weight:   170 lb (77.1 kg)   Height:        Intake/Output Summary (Last 24 hours) at 03/14/2018 1241 Last data filed at 03/14/2018 1001 Gross per 24 hour  Intake 520 ml  Output 1625 ml  Net -1105 ml   Filed Weights   03/12/18 0552 03/13/18 0335 03/14/18 0557  Weight: 172 lb (78 kg) 169 lb (76.7 kg) 170 lb (77.1 kg)    Telemetry    Paced rhythm - Personally Reviewed  Physical Exam  Appears comfortable sitting up GEN: No acute distress.   Neck:  8-10 cm jugular venous pulsation elevation with prominent V waves, no carotid bruits Cardiac: Irregular rhythm, no murmurs, rubs, or gallops.  Respiratory: Clear to auscultation bilaterally, no wheezes/ rales/ rhonchi GI: NABS, Soft, nontender, non-distended  MS:  1-2+ bilateral  pretibial edema, right greater than left; No deformity. Neuro:  Nonfocal, moving all extremities spontaneously Psych: Normal affect   Labs    Chemistry Recent Labs  Lab 03/08/18 1552  03/11/18 0263 03/12/18 0637 03/13/18 0547 03/14/18 0507  NA 141   < > 141 141 142 140  K 5.7*   < > 4.9 4.3 4.2 4.1  CL 121*   < > 113* 112* 107 109  CO2 14*   < > 16* 20* 21* 23  GLUCOSE 111*   < > 106* 106* 116* 121*  BUN 53*   < > 65* 68* 74* 82*  CREATININE 3.45*   < > 3.36* 3.42* 3.30* 3.41*  CALCIUM 8.3*   < > 8.3* 8.2* 8.3* 7.7*  PROT 6.5  --   --   --   --   --   ALBUMIN 2.6*  --  2.5* 2.4*  --   --   AST 30  --   --   --   --   --   ALT 22  --   --   --   --   --   ALKPHOS 28*  --   --   --   --   --   BILITOT 1.7*  --   --   --   --   --  GFRNONAA 15*   < > 16* 15* 16* 15*  GFRAA 17*   < > 18* 18* 18* 18*  ANIONGAP 6   < > 12 9 14 8    < > = values in this interval not displayed.     Hematology Recent Labs  Lab 03/08/18 1552 03/09/18 0554 03/14/18 0507  WBC 5.4 5.4 14.0*  RBC 2.71* 2.69* 2.66*  HGB 8.7* 8.7* 8.5*  HCT 28.8* 29.2* 26.5*  MCV 106.3* 108.6* 99.6  MCH 32.1 32.3 32.0  MCHC 30.2 29.8* 32.1  RDW 15.5 15.7* 15.5  PLT 100* 95* 105*    Cardiac Enzymes Recent Labs  Lab 03/08/18 1552 03/08/18 2350 03/09/18 0554  TROPONINI 0.06* 0.06* 0.05*   No results for input(s): TROPIPOC in the last 168 hours.   BNP Recent Labs  Lab 03/08/18 1552  BNP 2,285.4*     DDimer No results for input(s): DDIMER in the last 168 hours.   Radiology    No results found.  Cardiac Studies   Echo 03/09/18 Study Conclusions  - Left ventricle: The cavity size was severely dilated. Systolic function was severely reduced. The estimated ejection fraction was in the range of 20% to 25%. Diffuse hypokinesis. There is akinesis of the inferoseptal myocardium. The study is not technically sufficient to allow evaluation of LV diastolic function. - Aortic valve: There  was mild regurgitation. - Aorta: Aortic root dimension: 43 mm (ED). - Ascending aorta: The ascending aorta was mildly dilated. - Mitral valve: Mildly thickened leaflets . There was moderate to severe regurgitation directed posteriorly. - Left atrium: The atrium was severely dilated. - Right ventricle: The cavity size was moderately dilated. Wall thickness was normal. Pacer wire or catheter noted in right ventricle. - Right atrium: The atrium was severely dilated. - Tricuspid valve: There was severe regurgitation. - Pulmonary arteries: Systolic pressure was moderately increased. PA peak pressure: 50 mm Hg (S).   Patient Profile     84 year old male with past medical history of nonischemic cardiomyopathy, atrial fibrillation on chronic Coumadin, chronic systolic congestive heart failure, prior BivICD with nonfunctional atrial lead, chronic stage III kidney disease, being followed by cardiology for acute on chronic systolic congestive heart failure   Assessment & Plan      1. Acute on chronic systolic CHF/ Non-ischemic cardiomyopathy: p/w SOB and LE edema. Echo this admission with EF 20-25% and diffuse hypokinesis. Also with worsening renal function and low albumin which are likely contributing to volume overload. He was started on IV lasix 80mg  BID with UOP net -715L in the last 24 hours and -4.1L this admission. Weight 169lbs yesterday to 170lbs today. Cr stable at 3.4 today. Palliative care evaluated patient yesterday and thought he would be a candidate for hospice services, however family declining at this time. Palliative care to follow outpatient for continued symptoms management and ongoing GOC discussions.  - Continue IV lasix 80mg  BID.  Transition to oral diuretics tomorrow.  He was only receiving 20 mg of furosemide daily as an outpatient, which I think is far from sufficient. - Unable to add ACEi/ARB due to renal insufficiency - Not on BBlocker or hydralazine due to soft  BP's to allow room for diuresis.  - Continue to monitor strict I&Os and daily weights  2. Atrial fibrillation: ICD interrogated 03/13/18 revealed BiV pacing efficiency has decreased to 50-60% over the last week due to increased ventricular rate with atrial fibrillation. Started on amiodarone 400mg  daily 7/1 for better rate control. - Continue coumadin -  INR at goal - 2.11 today. - Continue amiodarone, compensate for anticipated drug interaction with warfarin  3. Mild troponin elevation: trop trend 0.06>0.06>0.05, not consistent with ACS - No further ischemic work-up at this time  4. UTI: On IV CTX - Continue management per primary team  5. Acute on chronic renal insuffiencey: Cr stable at 3.41 today - Continue to monitor closely with diuresis  6. PPM: ICD interrogated 03/13/18 revealed BiV pacing efficiency has decreased to 50-60% over the last week due to increased ventricular rate with atrial fibrillation.  Amiodarone restarted - Continue routine monitoring    Thurmon Fair, MD, [facility] [phone] office [phone] pager

## 2018-03-14 NOTE — Progress Notes (Signed)
Physical Therapy Treatment Patient Details Name: Tyler Gray MRN: [id number] DOB: 1933/04/01 Today's Date: 03/14/2018    History of Present Illness Tyler Gray is a 84 y.o. male with medical history significant for nonischemic cardiomyopathy, paroxysmal atrial fibrillation on warfarin, chronic systolic heart failure, AICD, hyperlipidemia, and chronic kidney disease stage III    PT Comments    Pt showing decline in functional mobility since previous PT session. Attempted sit<>stand multiple times. Pt unable to rise from recliner chair with total A+2 with stedy frame. Pt currently limited by bil knee pain and weakness. Feel pt may be able to progress once knee pain is resolved. Will continue to follow acutely.     Follow Up Recommendations  SNF;Supervision/Assistance - 24 hour     Equipment Recommendations  (TBD next venue)    Recommendations for Other Services       Precautions / Restrictions Precautions Precautions: Fall Restrictions Weight Bearing Restrictions: No    Mobility  Bed Mobility               General bed mobility comments: Pt OOB in chair upon arrival  Transfers Overall transfer level: Needs assistance   Transfers: Sit to/from Stand Sit to Stand: Total assist;+2 physical assistance         General transfer comment: Multiple attempts to stand with stedy frame from recliner chair and bed pad under hips to assist in power up. Pt unable to rise into standing with total lift assist. Pt with c/o pain in knees and weakness throughout session  Ambulation/Gait             General Gait Details: unable to attempt   Stairs             Wheelchair Mobility    Modified Rankin (Stroke Patients Only)       Balance Overall balance assessment: Needs assistance Sitting-balance support: Feet supported Sitting balance-Leahy Scale: Fair     Standing balance support: No upper extremity supported;During functional activity Standing  balance-Leahy Scale: Zero Standing balance comment: unable to rise into standing with stedy and total A                            Cognition Arousal/Alertness: Awake/alert Behavior During Therapy: WFL for tasks assessed/performed Overall Cognitive Status: No family/caregiver present to determine baseline cognitive functioning                         Following Commands: Follows one step commands consistently     Problem Solving: Slow processing;Requires verbal cues General Comments: Pt A&O x3 today. Could not state the month or year correctly. Slight delay in processing,       Exercises      General Comments        Pertinent Vitals/Pain Pain Assessment: Faces Faces Pain Scale: Hurts even more Pain Location: BIL Knees, L>R Pain Descriptors / Indicators: Grimacing;Guarding;Moaning Pain Intervention(s): Monitored during session;Limited activity within patient's tolerance    Home Living                      Prior Function            PT Goals (current goals can now be found in the care plan section) Acute Rehab PT Goals Patient Stated Goal: get stronger PT Goal Formulation: With patient Time For Goal Achievement: 03/17/18 Potential to Achieve Goals: Good Progress towards PT goals: Not progressing  toward goals - comment    Frequency    Min 3X/week      PT Plan Current plan remains appropriate    Co-evaluation              AM-PAC PT "6 Clicks" Daily Activity  Outcome Measure  Difficulty turning over in bed (including adjusting bedclothes, sheets and blankets)?: Unable Difficulty moving from lying on back to sitting on the side of the bed? : Unable Difficulty sitting down on and standing up from a chair with arms (e.g., wheelchair, bedside commode, etc,.)?: Unable Help needed moving to and from a bed to chair (including a wheelchair)?: Total Help needed walking in hospital room?: Total Help needed climbing 3-5 steps with a  railing? : Total 6 Click Score: 6    End of Session Equipment Utilized During Treatment: Gait belt Activity Tolerance: Patient limited by pain(limited by weakness) Patient left: in chair;with call bell/phone within reach;with chair alarm set Nurse Communication: Mobility status;Need for lift equipment PT Visit Diagnosis: Unsteadiness on feet (R26.81);Other abnormalities of gait and mobility (R26.89);History of falling (Z91.81);Muscle weakness (generalized) (M62.81);Repeated falls (R29.6)     Time: 1206-1228 PT Time Calculation (min) (ACUTE ONLY): 22 min  Charges:  $Therapeutic Activity: 8-22 mins                    G Codes:       Tyler Gray, Tyler Gray Pager [phone] Acute Rehab   Tyler Gray 03/14/2018, 1:13 PM

## 2018-03-14 NOTE — Progress Notes (Signed)
Daily Progress Note   Patient Name: Tyler Gray       Date: 03/14/2018 DOB: 1933-07-10  Age: 84 y.o. MRN#: [id number] Attending Physician: Tyrone Nine, MD Primary Care Physician: Renaye Rakers, MD Admit Date: 03/08/2018  Reason for Consultation/Follow-up: Establishing goals of care  Subjective: Patient alone this morning, tells me he feels okay, slept well.   Length of Stay: 6  Current Medications: Scheduled Meds:  . amiodarone  400 mg Oral Daily  . atorvastatin  40 mg Oral q1800  . feeding supplement (ENSURE ENLIVE)  237 mL Oral TID BM  . furosemide  80 mg Intravenous BID  . multivitamin with minerals  1 tablet Oral Daily  . Warfarin - Pharmacist Dosing Inpatient   Does not apply q1800    Continuous Infusions: . cefTRIAXone (ROCEPHIN)  IV 1 g (03/14/18 1600)    PRN Meds: acetaminophen, ondansetron **OR** ondansetron (ZOFRAN) IV, polyethylene glycol  Physical Exam         Constitutional: He is oriented to person, place, and time. He appears lethargic. He is cooperative. No distress.  HENT:  Head: Normocephalic and atraumatic.  Cardiovascular: Normal rate.  Pulmonary/Chest: Effort normal.  Clear anteriorly  Abdominal: Soft. Bowel sounds are normal.  Musculoskeletal: He exhibits edema.  Neurological: He is oriented to person, place, and time. He appears lethargic.  Skin: Skin is warm and dry.  Psychiatric: He is slowed.   Vital Signs: BP 100/70 (BP Location: Right Arm)   Pulse 72   Temp 99 F (37.2 C) (Oral)   Resp 12   Ht 5\' 8"  (1.727 m)   Wt 77.1 kg (170 lb)   SpO2 100%   BMI 25.85 kg/m  SpO2: SpO2: 100 % O2 Device: O2 Device: Room Air O2 Flow Rate:    Intake/output summary:   Intake/Output Summary (Last 24 hours) at 03/14/2018 1700 Last data filed at 03/14/2018  1557 Gross per 24 hour  Intake 400 ml  Output 1125 ml  Net -725 ml   LBM: Last BM Date: 03/11/18 Baseline Weight: Weight: 73 kg (161 lb) Most recent weight: Weight: 77.1 kg (170 lb)       Palliative Assessment/Data: PPS 30%    Flowsheet Rows     Most Recent Value  Intake Tab  Referral Department  Hospitalist  Unit at Time of Referral  Cardiac/Telemetry Unit  Palliative Care Primary Diagnosis  Cardiac  Date Notified  03/12/18  Palliative Care Type  New Palliative care  Reason for referral  Clarify Goals of Care  Date of Admission  04/07/18  Date first seen by Palliative Care  03/13/18  # of days Palliative referral response time  1 Day(s)  # of days IP prior to Palliative referral  -26  Clinical Assessment  Palliative Performance Scale Score  40%  Psychosocial & Spiritual Assessment  Palliative Care Outcomes  Patient/Family meeting held?  Yes  Who was at the meeting?  wife  Palliative Care Outcomes  Counseled regarding hospice, Provided psychosocial or spiritual support, ACP counseling assistance, Linked to palliative care logitudinal support      Patient Active Problem List   Diagnosis Date Noted  . Palliative care by specialist   . Malnutrition  of moderate degree 03/09/2018  . Pressure injury of skin 03/09/2018  . Acute kidney injury superimposed on chronic kidney disease (HCC) 03/08/2018  . Acute lower UTI 03/08/2018  . Acute systolic CHF (congestive heart failure) (HCC) 03/08/2018  . Elevated INR 03/08/2018  . Hyperkalemia 03/08/2018  . Elevated troponin 03/08/2018  . Pacemaker lead failure 04/28/2017  . Goals of care, counseling/discussion 12/23/2014  . Paroxysmal atrial fibrillation (HCC) 02/10/2010  . CHRONIC SYSTOLIC HEART FAILURE 02/10/2010  . Chronic kidney disease, stage IV (severe) (HCC) 02/10/2010  . Automatic implantable cardioverter-defibrillator in situ 02/10/2010    Palliative Care Assessment & Plan   HPI: 84 y.o. male  with past medical  history of a fib on coumadin, nonischemic cardiomyopathy, chronic systolic CHF s/p biventricular pacemaker (EF 20%), right nephrolithiasis with obstruction, and CKD 3 admitted on 03/08/2018 with weakness, shortness of breath, and BLE edema. Also found to have UTI.Volume overload attributed to CHF, renal failure, and low albumin. Renal function has worsened and renal ultrasound revealed right hydronephrosis. CT confirmed presence of obstructing ureteral and nonobstructing bladder stones which have been there since 2013, per urology. Palliative care consulted for continued goals of care discussions as he is entering into advanced CKD but not felt to be a dialysis candidate.  Assessment: Follow up with patient today. He is alone. He recalls most of our conversation from yesterday. Tells me he wants to try rehab to try to build some strength - realizes he is incredibly weak.  He seems more alert than our meeting yesterday.  We discussed his current illness and what it means in the larger context of his on-going co-morbidities.  Natural disease trajectory was discussed. Specifically discussed heart and kidney failure.   Again, discussed code status. Patient does not want to make a decision - says "he needs to think about it". His wife shares with me he is fearful of death. Per conversation with wife yesterday, she understands severity of illness and would elect DNR.   Discussed with wife and daughter yesterday that patient is at high risk for readmission. They are interested is discharge to rehab with palliative to follow outpatient.  Hospice and Palliative Care services outpatient were explained and offered. We discussed that patient is appropriate for hospice now; however, they would like to pursue rehab first. If patient continues to decline in rehab, may utilize hospice benefit at that time. Palliative care to follow outpatient and continue goals of care discussions.   Questions and concerns were  addressed. Emotional support provided.   Recommendations/Plan: **Discharging MD please write for palliative medicine to follow at rehab in discharge summary** - family is processing difficult news of how sick patient is -wife would elect DNR, patient undecided but unclear if he has decision making capacity -PMT will continue to follow to continue GOC discussions -Order spiritual care consult as wife expresses spiritual concerns  Goals of Care and Additional Recommendations:  Limitations on Scope of Treatment: Full Scope Treatment  Code Status:  Full code - continue discussions  Prognosis:   Unable to determine  - poor prognosis r/t cardiorenal syndrome  Discharge Planning:  Skilled Nursing Facility for rehab with Palliative care service follow-up  Care plan was discussed with patient  Thank you for allowing the Palliative Medicine Team to assist in the care of this patient.   Total Time 30 minutes Prolonged Time Billed  no       Greater than 50%  of this time was spent counseling and coordinating care related to  the above assessment and plan.  Gerlean Ren, DNP, [facility] Palliative Medicine Team Team Phone # [phone]  Pager [phone]

## 2018-03-15 LAB — BASIC METABOLIC PANEL
Anion gap: 11 (ref 5–15)
BUN: 97 mg/dL — AB (ref 8–23)
CHLORIDE: 106 mmol/L (ref 98–111)
CO2: 23 mmol/L (ref 22–32)
Calcium: 7.7 mg/dL — ABNORMAL LOW (ref 8.9–10.3)
Creatinine, Ser: 3.54 mg/dL — ABNORMAL HIGH (ref 0.61–1.24)
GFR calc Af Amer: 17 mL/min — ABNORMAL LOW (ref >60)
GFR calc non Af Amer: 15 mL/min — ABNORMAL LOW (ref >60)
GLUCOSE: 135 mg/dL — AB (ref 70–99)
POTASSIUM: 3.9 mmol/L (ref 3.5–5.1)
Sodium: 140 mmol/L (ref 135–145)

## 2018-03-15 LAB — PROTIME-INR
INR: 2.13
Prothrombin Time: 23.6 seconds — ABNORMAL HIGH (ref 11.4–15.2)

## 2018-03-15 LAB — KAPPA/LAMBDA LIGHT CHAINS
KAPPA, LAMDA LIGHT CHAIN RATIO: 6.85 — AB (ref 0.26–1.65)
Kappa free light chain: 334.8 mg/L — ABNORMAL HIGH (ref 3.3–19.4)
LAMDA FREE LIGHT CHAINS: 48.9 mg/L — AB (ref 5.7–26.3)

## 2018-03-15 LAB — URINE CULTURE

## 2018-03-15 MED ORDER — FUROSEMIDE 80 MG PO TABS
80.0000 mg | ORAL_TABLET | Freq: Every day | ORAL | Status: DC
Start: 2018-03-16 — End: 2018-03-17
  Administered 2018-03-16: 80 mg via ORAL
  Filled 2018-03-15: qty 1

## 2018-03-15 MED ORDER — WARFARIN SODIUM 2.5 MG PO TABS
4.5000 mg | ORAL_TABLET | Freq: Once | ORAL | Status: AC
  Administered 2018-03-15: 4.5 mg via ORAL
  Filled 2018-03-15: qty 1

## 2018-03-15 NOTE — Clinical Social Work Note (Addendum)
CSW faxed clinicals to [facility] for authorization review for potential discharge Thursday or Friday to [facility].  Charlynn Court, CSW [phone]  11:59 am CSW confirmed with [facility] they received the clinicals that were faxed over this morning.  Charlynn Court, CSW [phone]  2:23 pm Faxed today's OT note to [facility].  Charlynn Court, CSW [phone]  3:14 pm Authorization approved for admission to [facility] tomorrow: [phone] rug level RVB. SNF notified.  Charlynn Court, CSW [phone]

## 2018-03-15 NOTE — Progress Notes (Signed)
Progress Note  Patient Name: Tyler Gray Date of Encounter: 03/15/2018  Primary Cardiologist: Lesleigh Noe, MD   Subjective   Feeling well, no dyspnea.  Fluid balance essentially neutral over the last 24 hours, after switching to oral diuretics.  Weight steady over the last 3 days at around 77 kg  Inpatient Medications    Scheduled Meds: . amiodarone  400 mg Oral Daily  . atorvastatin  40 mg Oral q1800  . feeding supplement (ENSURE ENLIVE)  237 mL Oral TID BM  . furosemide  80 mg Intravenous BID  . multivitamin with minerals  1 tablet Oral Daily  . warfarin  4.5 mg Oral ONCE-1800  . Warfarin - Pharmacist Dosing Inpatient   Does not apply q1800   Continuous Infusions: . cefTRIAXone (ROCEPHIN)  IV 1 g (03/14/18 1600)   PRN Meds: acetaminophen, ondansetron **OR** ondansetron (ZOFRAN) IV, polyethylene glycol   Vital Signs    Vitals:   03/14/18 2058 03/15/18 0400 03/15/18 0422 03/15/18 0815  BP: 103/74  93/65 (!) 101/56  Pulse: 69  73 (!) 39  Resp: 14  20   Temp: 98.2 F (36.8 C)  99 F (37.2 C) 97.6 F (36.4 C)  TempSrc: Oral  Oral Oral  SpO2: 100%  99% 99%  Weight:  169 lb (76.7 kg)    Height:        Intake/Output Summary (Last 24 hours) at 03/15/2018 1015 Last data filed at 03/15/2018 0600 Gross per 24 hour  Intake 1079.86 ml  Output 800 ml  Net 279.86 ml   Filed Weights   03/13/18 0335 03/14/18 0557 03/15/18 0400  Weight: 169 lb (76.7 kg) 170 lb (77.1 kg) 169 lb (76.7 kg)    Telemetry    Atrial fibrillation, mostly ventricular paced beats (probably 70-80%)- Personally Reviewed  ECG    No new tracing- Personally Reviewed  Physical Exam  Appears elderly and rather frail GEN: No acute distress.   Neck: No JVD sitting straight up at the side of the bed Cardiac: RRR with intermittent irregularity, paradoxically split second heart sound, no murmurs, rubs, or gallops.  Respiratory: Clear to auscultation bilaterally. GI: Soft, nontender,  non-distended  MS:  Symmetrical trace ankle edema; No deformity. Neuro:  Nonfocal  Psych: Normal affect   Labs    Chemistry Recent Labs  Lab 03/08/18 1552  03/11/18 4696 03/12/18 2952 03/13/18 0547 03/14/18 0507 03/15/18 0616  NA 141   < > 141 141 142 140 140  K 5.7*   < > 4.9 4.3 4.2 4.1 3.9  CL 121*   < > 113* 112* 107 109 106  CO2 14*   < > 16* 20* 21* 23 23  GLUCOSE 111*   < > 106* 106* 116* 121* 135*  BUN 53*   < > 65* 68* 74* 82* 97*  CREATININE 3.45*   < > 3.36* 3.42* 3.30* 3.41* 3.54*  CALCIUM 8.3*   < > 8.3* 8.2* 8.3* 7.7* 7.7*  PROT 6.5  --   --   --   --   --   --   ALBUMIN 2.6*  --  2.5* 2.4*  --   --   --   AST 30  --   --   --   --   --   --   ALT 22  --   --   --   --   --   --   ALKPHOS 28*  --   --   --   --   --   --  BILITOT 1.7*  --   --   --   --   --   --   GFRNONAA 15*   < > 16* 15* 16* 15* 15*  GFRAA 17*   < > 18* 18* 18* 18* 17*  ANIONGAP 6   < > 12 9 14 8 11    < > = values in this interval not displayed.     Hematology Recent Labs  Lab 03/08/18 1552 03/09/18 0554 03/14/18 0507  WBC 5.4 5.4 14.0*  RBC 2.71* 2.69* 2.66*  HGB 8.7* 8.7* 8.5*  HCT 28.8* 29.2* 26.5*  MCV 106.3* 108.6* 99.6  MCH 32.1 32.3 32.0  MCHC 30.2 29.8* 32.1  RDW 15.5 15.7* 15.5  PLT 100* 95* 105*    Cardiac Enzymes Recent Labs  Lab 03/08/18 1552 03/08/18 2350 03/09/18 0554  TROPONINI 0.06* 0.06* 0.05*   No results for input(s): TROPIPOC in the last 168 hours.   BNP Recent Labs  Lab 03/08/18 1552  BNP 2,285.4*     DDimer No results for input(s): DDIMER in the last 168 hours.   Radiology    No results found.  Cardiac Studies   Echo 03/09/18 Study Conclusions  - Left ventricle: The cavity size was severely dilated. Systolic function was severely reduced. The estimated ejection fraction was in the range of 20% to 25%. Diffuse hypokinesis. There is akinesis of the inferoseptal myocardium. The study is not technically sufficient to allow  evaluation of LV diastolic function. - Aortic valve: There was mild regurgitation. - Aorta: Aortic root dimension: 43 mm (ED). - Ascending aorta: The ascending aorta was mildly dilated. - Mitral valve: Mildly thickened leaflets . There was moderate to severe regurgitation directed posteriorly. - Left atrium: The atrium was severely dilated. - Right ventricle: The cavity size was moderately dilated. Wall thickness was normal. Pacer wire or catheter noted in right ventricle. - Right atrium: The atrium was severely dilated. - Tricuspid valve: There was severe regurgitation. - Pulmonary arteries: Systolic pressure was moderately increased. PA peak pressure: 50 mm Hg (S).    Patient Profile     84 y.o. male with past medical history of nonischemic cardiomyopathy, atrial fibrillation on chronic Coumadin, chronic systolic congestive heart failure, prior BivICD with nonfunctional atrial lead, chronic stage III kidney disease, beingfollowed by cardiologyfor acute on chronic systolic congestive heart failure  Assessment & Plan    1. Acute on chronic systolic CHF/ Non-ischemic cardiomyopathy: Seems to be doing well, transition to oral diuretics.  Will require frequent monitoring for fluid gain, try to keep a "dry weight" 170 pounds or less.  Difficult to maintain balance between heart failure and renal insufficiency.  2. Atrial fibrillation:Amiodarone restarted for rate control since there has been a drastic reduction in CRT efficiency.  Anticipate amiodarone-warfarin interaction over the next several weeks.  Reduce the amiodarone dose to 200 mg once daily after 4 weeks, July 28.  3.Acute on chronic renal insuffiencey:  Creatinine essentially unchanged  4.CRT-D:ICD interrogated 03/13/18 revealed BiV pacing efficiency has decreased to 50-60% over the last week due to increased ventricular rate with atrial fibrillation.  Amiodarone restarted      For questions or updates,  please contact [facility] Please consult [email or web address] for contact info under Cardiology/STEMI.      Signed, Thurmon Fair, MD  03/15/2018, 10:15 AM

## 2018-03-15 NOTE — Progress Notes (Signed)
Occupational Therapy Treatment Patient Details Name: Tyler Gray MRN: [id number] DOB: [birth year] Today's Date: 03/15/2018    History of present illness Tyler Gray is a 84 y.o. male with medical history significant for nonischemic cardiomyopathy, paroxysmal atrial fibrillation on warfarin, chronic systolic heart failure, AICD, hyperlipidemia, and chronic kidney disease stage III   OT comments  Pt required encouragement to sit EOB for activities. Pt sat EOB with max A for bed mobility. Pt sat ~ 18 minutes to feed self and grooming. Pt returned to supine for hygiene after BM. OT will continue to follow acutely  Follow Up Recommendations  SNF;Supervision/Assistance - 24 hour    Equipment Recommendations  3 in 1 bedside commode    Recommendations for Other Services      Precautions / Restrictions Precautions Precautions: Fall Restrictions Weight Bearing Restrictions: No       Mobility Bed Mobility Overal bed mobility: Needs Assistance Bed Mobility: Rolling;Supine to Sit;Sit to Supine Rolling: Mod assist   Supine to sit: Max assist Sit to supine: Total assist      Transfers                 General transfer comment: NT    Balance Overall balance assessment: Needs assistance Sitting-balance support: Feet supported Sitting balance-Leahy Scale: Fair Sitting balance - Comments: pt sat EOB to eat and take meds                                   ADL either performed or assessed with clinical judgement   ADL Overall ADL's : Needs assistance/impaired Eating/Feeding: Sitting Eating/Feeding Details (indicate cue type and reason): min guard A for safety Grooming: Wash/dry hands;Min guard;Sitting;Wash/dry face   Upper Body Bathing: Sitting;Minimal assistance Upper Body Bathing Details (indicate cue type and reason): simulated Lower Body Bathing: Maximal assistance;Sitting/lateral leans Lower Body Bathing Details (indicate cue type and reason):  simulated Upper Body Dressing : Min guard;Sitting           Toileting- Clothing Manipulation and Hygiene: Total assistance Toileting - Clothing Manipulation Details (indicate cue type and reason): pt returned to supine in bed for hygiene after BM             Vision Patient Visual Report: No change from baseline     Perception     Praxis      Cognition Arousal/Alertness: Awake/alert Behavior During Therapy: WFL for tasks assessed/performed Overall Cognitive Status: Within Functional Limits for tasks assessed                         Following Commands: Follows one step commands consistently     Problem Solving: Slow processing;Requires verbal cues General Comments: Pt A&O x3 today. Could not state the month or year correctly. Slight delay in processing,         Exercises     Shoulder Instructions       General Comments      Pertinent Vitals/ Pain       Pain Assessment: Faces Faces Pain Scale: Hurts whole lot Pain Location: BIL Knees, L>R Pain Descriptors / Indicators: Grimacing;Guarding;Moaning Pain Intervention(s): Limited activity within patient's tolerance;Monitored during session;Repositioned  Home Living  Prior Functioning/Environment              Frequency  Min 2X/week        Progress Toward Goals  OT Goals(current goals can now be found in the care plan section)  Progress towards OT goals: OT to reassess next treatment     Plan Discharge plan remains appropriate    Co-evaluation                 AM-PAC PT "6 Clicks" Daily Activity     Outcome Measure   Help from another person eating meals?: A Little Help from another person taking care of personal grooming?: Total Help from another person toileting, which includes using toliet, bedpan, or urinal?: Total Help from another person bathing (including washing, rinsing, drying)?: A Lot Help from another  person to put on and taking off regular upper body clothing?: A Little Help from another person to put on and taking off regular lower body clothing?: A Lot 6 Click Score: 12    End of Session    OT Visit Diagnosis: Unsteadiness on feet (R26.81);Repeated falls (R29.6);Muscle weakness (generalized) (M62.81)   Activity Tolerance Patient tolerated treatment well   Patient Left with call bell/phone within reach;in bed;with family/visitor present;with nursing/sitter in room   Nurse Communication      Functional Assessment Tool Used: AM-PAC 6 Clicks Daily Activity   Time: 7628-3151 OT Time Calculation (min): 29 min  Charges: OT G-codes **NOT FOR INPATIENT CLASS** Functional Assessment Tool Used: AM-PAC 6 Clicks Daily Activity OT General Charges $OT Visit: 1 Visit OT Treatments $Self Care/Home Management : 98-112 mins     Galen Manila 03/15/2018, 1:38 PM

## 2018-03-15 NOTE — Care Management Important Message (Signed)
Important Message  Patient Details  Name: Tyler Gray MRN: [id number] Date of Birth: 10-26-1932   Medicare Important Message Given:  Yes    Idelle Reimann P Montrel Donahoe 03/15/2018, 2:48 PM

## 2018-03-15 NOTE — Progress Notes (Signed)
ANTICOAGULATION CONSULT NOTE   Pharmacy Consult for Warfarin Indication: atrial fibrillation  No Known Allergies  Labs: Recent Labs    03/13/18 0547 03/14/18 0507 03/15/18 0616  HGB  --  8.5*  --   HCT  --  26.5*  --   PLT  --  105*  --   LABPROT 23.6* 23.5* 23.6*  INR 2.12 2.11 2.13  CREATININE 3.30* 3.41* 3.54*   Assessment: 84 yo male admitted for generalized weakness. On Warfarin PTA for Atrial fibrillation.  On admission, INR was supratherapeutic at 3.6. Warfarin held for the last few days until INR came down to the therapeutic range, and Warfarin resumed.    INR Today: 2.13, no updated CBC today, no overt bleeding noted  Home Warfarin regimen:  3 mg alternating with 4.5 mg every other day.  Meds Reviewed: Interaction with amiodarone started on 6/30  Goal of Therapy:  INR 2-3   Plan:  Warfarin 4.5 mg this evening. Daily H&H and INR, redosing the Warfarin accordingly.  Ruben Im, PharmD Clinical Pharmacist 03/15/2018 9:51 AM Please check AMION for all [facility] Pharmacy numbers

## 2018-03-15 NOTE — Plan of Care (Signed)
  Problem: Nutrition: Goal: Adequate nutrition will be maintained Outcome: Progressing   Problem: Pain Managment: Goal: General experience of comfort will improve Outcome: Progressing   Problem: Skin Integrity: Goal: Risk for impaired skin integrity will decrease Outcome: Progressing   

## 2018-03-15 NOTE — Progress Notes (Signed)
Daily Progress Note   Patient Name: Tyler Gray       Date: 03/15/2018 DOB: 08/08/1933  Age: 84 y.o. MRN#: [id number] Attending Physician: Coralie Keens Primary Care Physician: Renaye Rakers, MD Admit Date: 03/08/2018  Reason for Consultation/Follow-up: Establishing goals of care  Subjective: Patient alone this morning, tells me he feels better. Not eating much. In good spirits.   Length of Stay: 7  Current Medications: Scheduled Meds:  . amiodarone  400 mg Oral Daily  . atorvastatin  40 mg Oral q1800  . feeding supplement (ENSURE ENLIVE)  237 mL Oral TID BM  . [START ON 03/16/2018] furosemide  80 mg Oral Daily  . multivitamin with minerals  1 tablet Oral Daily  . warfarin  4.5 mg Oral ONCE-1800  . Warfarin - Pharmacist Dosing Inpatient   Does not apply q1800    Continuous Infusions: . cefTRIAXone (ROCEPHIN)  IV 1 g (03/14/18 1600)    PRN Meds: acetaminophen, ondansetron **OR** ondansetron (ZOFRAN) IV, polyethylene glycol  Physical Exam         Constitutional: He is oriented to person, place, and time. He is cooperative. No distress.  HENT:  Head: Normocephalic and atraumatic.  Cardiovascular: Normal rate.  Pulmonary/Chest: Effort normal.  Clear anteriorly  Abdominal: Soft. Bowel sounds are normal.  Musculoskeletal: He exhibits edema.  Neurological: He is oriented to person, place, and time. Skin: Skin is warm and dry.   Vital Signs: BP 108/69 (BP Location: Left Arm)   Pulse (!) 39   Temp 97.6 F (36.4 C) (Oral)   Resp 20   Ht 5\' 8"  (1.727 m)   Wt 76.7 kg (169 lb)   SpO2 99%   BMI 25.70 kg/m  SpO2: SpO2: 99 % O2 Device: O2 Device: Room Air O2 Flow Rate:    Intake/output summary:   Intake/Output Summary (Last 24 hours) at 03/15/2018 1145 Last data filed at  03/15/2018 0915 Gross per 24 hour  Intake 1229.86 ml  Output 800 ml  Net 429.86 ml   LBM: Last BM Date: 03/15/18 Baseline Weight: Weight: 73 kg (161 lb) Most recent weight: Weight: 76.7 kg (169 lb)       Palliative Assessment/Data: PPS 30%    Flowsheet Rows     Most Recent Value  Intake Tab  Referral Department  Hospitalist  Unit at Time of Referral  Cardiac/Telemetry Unit  Palliative Care Primary Diagnosis  Cardiac  Date Notified  03/12/18  Palliative Care Type  New Palliative care  Reason for referral  Clarify Goals of Care  Date of Admission  04/07/18  Date first seen by Palliative Care  03/13/18  # of days Palliative referral response time  1 Day(s)  # of days IP prior to Palliative referral  -26  Clinical Assessment  Palliative Performance Scale Score  40%  Psychosocial & Spiritual Assessment  Palliative Care Outcomes  Patient/Family meeting held?  Yes  Who was at the meeting?  wife  Palliative Care Outcomes  Counseled regarding hospice, Provided psychosocial or spiritual support, ACP counseling assistance, Linked to palliative care logitudinal support      Patient Active Problem List   Diagnosis Date Noted  . Palliative care by specialist   .  Malnutrition of moderate degree 03/09/2018  . Pressure injury of skin 03/09/2018  . Acute kidney injury superimposed on chronic kidney disease (HCC) 03/08/2018  . Acute lower UTI 03/08/2018  . Acute systolic CHF (congestive heart failure) (HCC) 03/08/2018  . Elevated INR 03/08/2018  . Hyperkalemia 03/08/2018  . Elevated troponin 03/08/2018  . Pacemaker lead failure 04/28/2017  . Goals of care, counseling/discussion 12/23/2014  . Paroxysmal atrial fibrillation (HCC) 02/10/2010  . CHRONIC SYSTOLIC HEART FAILURE 02/10/2010  . Chronic kidney disease, stage IV (severe) (HCC) 02/10/2010  . Automatic implantable cardioverter-defibrillator in situ 02/10/2010    Palliative Care Assessment & Plan   HPI: 84 y.o. male  with  past medical history of a fib on coumadin, nonischemic cardiomyopathy, chronic systolic CHF s/p biventricular pacemaker (EF 20%), right nephrolithiasis with obstruction, and CKD 3 admitted on 03/08/2018 with weakness, shortness of breath, and BLE edema. Also found to have UTI.Volume overload attributed to CHF, renal failure, and low albumin. Renal function has worsened and renal ultrasound revealed right hydronephrosis. CT confirmed presence of obstructing ureteral and nonobstructing bladder stones which have been there since 2013, per urology. Palliative care consulted for continued goals of care discussions as he is entering into advanced CKD but not felt to be a dialysis candidate.  Assessment: Follow up with patient today. He is alone. Tells me he is comfortable and feeling better. Tells me he wants to try rehab to try to build some strength - realizes he is incredibly weak.  We discussed his current illness and what it means in the larger context of his on-going co-morbidities.  Natural disease trajectory was discussed. Specifically discussed heart and kidney failure.   Called wife to follow up - she has no questions or concerns for me except asking when he will be discharged. Eager for him to go to rehab. Shared with her about conversation with patient yesterday - he does not want to think about what he would want if he were to decline further. She feels patient should be DNR but he does not want to make decisions and tells me he will talk about it with his wife. Encouraged wife to continue having these discussion with her husband. Encouraged her to continue these discussions with palliative care outpatient.   Hospice and Palliative Care services outpatient were explained and offered. We discussed that patient is appropriate for hospice now; however, they would like to pursue rehab first. If patient continues to decline in rehab, may utilize hospice benefit at that time. Palliative care to follow  outpatient and continue goals of care discussions.   Questions and concerns were addressed. Emotional support provided.   Recommendations/Plan: **Discharging MD please write for palliative medicine to follow at rehab in discharge summary** - family is processing difficult news of how sick patient is -wife would elect DNR, patient undecided but unclear if he has decision making capacity -Order spiritual care consult as wife expresses spiritual concerns  Goals of Care and Additional Recommendations:  Limitations on Scope of Treatment: Full Scope Treatment  Code Status:  Full code - continue discussions  Prognosis:   Unable to determine  - poor prognosis r/t cardiorenal syndrome  Discharge Planning:  Skilled Nursing Facility for rehab with Palliative care service follow-up  Care plan was discussed with patient  Thank you for allowing the Palliative Medicine Team to assist in the care of this patient.   Total Time 30 minutes Prolonged Time Billed  no       Greater than 50%  of this time was spent counseling and coordinating care related to the above assessment and plan.  Juel Burrow, DNP, [facility] Palliative Medicine Team Team Phone # [phone]  Pager [phone]

## 2018-03-15 NOTE — Progress Notes (Addendum)
PROGRESS NOTE    Tyler Gray  WJX:[id number] DOB: Oct 17, 1932 DOA: 03/08/2018 PCP: Renaye Rakers, MD    Brief Narrative:  84 year old male who presented with weakness and fatigue.  He does have a significant past medical history for nonischemic cardiomyopathy, paroxysmal atrial fibrillation, chronic systolic heart failure status post AICD, dyslipidemia chronic kidney disease stage III.  Patient was found by nursing confused and weak, along with dyspnea and lower extremity edema.  On his initial physical examination blood pressure 104/62, heart rate 61, temperature 97.8, respiratory rate 16, oxygen saturation 100%.  He has dry mucous membranes, lungs are clear to auscultation bilaterally, heart S1-S2 present and rhythmic, the abdomen was soft nontender, positive lower extremity edema 2+ up to the sacrum.  Sodium 142, potassium 4.2, chloride 107, bicarb 21, was 116, BUN 74, creatinine 3.30, white count 14.0, hemoglobin 8.5 hematocrit 26.5, platelets 105, INR 2.1.  Urinalysis specific gravity 1.009, white cells greater than 50 RBCs 11-20.   Patient was admitted to the hospital with working diagnosis of decompensated systolic heart failure, complicated by acute kidney injury and hyperkalemia.   Assessment & Plan:   Active Problems:   Paroxysmal atrial fibrillation (HCC)   CHRONIC SYSTOLIC HEART FAILURE   Chronic kidney disease, stage IV (severe) (HCC)   Automatic implantable cardioverter-defibrillator in situ   Goals of care, counseling/discussion   Acute kidney injury superimposed on chronic kidney disease (HCC)   Acute lower UTI   Acute systolic CHF (congestive heart failure) (HCC)   Elevated INR   Hyperkalemia   Elevated troponin   Malnutrition of moderate degree   Pressure injury of skin   Palliative care by specialist   1. Acute on chronic systolic heart failure. Urine output 1,050 over last 24 hours, with a total negative fluid balance oc 3,956 since admission. Blood pressure  systolic 101 and 213 systolic. Transition to oral diuretics today, with plan for possible discharge home in am. No b blockade due to risk of bradycardia.   2. AKI on CKD stage 3. Renal function has remained stable with serum cr at 3,54 with serum K at 3,9, and bicarbonate at 23. Will follow on renal panel in am. Patient tolerating po well.   3. Chronic atrial fibrillation. Continue amiodarone for rate control and continue anticoagulation with warfarin.   4. Urine tract infection. Completed therapy with antibiotic x 3 days.     DVT prophylaxis: warfarin   Code Status: full Family Communication: I spoke with patient's wife at the bedside and all questions were addressed.  Disposition Plan: will need snf in am, is stable renal function and electrolytes.    Consultants:    Cariology   Nephrology    Procedures:     Antimicrobials:       Subjective: Patient is feeling better, dyspnea continue to improve along with lower extremity edema, no nausea or vomiting, no chest pain.   Objective: Vitals:   03/14/18 1150 03/14/18 2058 03/15/18 0400 03/15/18 0422  BP: 100/70 103/74  93/65  Pulse: 72 69  73  Resp: 12 14  20   Temp: 99 F (37.2 C) 98.2 F (36.8 C)  99 F (37.2 C)  TempSrc: Oral Oral  Oral  SpO2: 100% 100%  99%  Weight:   76.7 kg (169 lb)   Height:        Intake/Output Summary (Last 24 hours) at 03/15/2018 0942 Last data filed at 03/15/2018 0600 Gross per 24 hour  Intake 1199.86 ml  Output 1050 ml  Net  149.86 ml   Filed Weights   03/13/18 0335 03/14/18 0557 03/15/18 0400  Weight: 76.7 kg (169 lb) 77.1 kg (170 lb) 76.7 kg (169 lb)    Examination:   General: deconditioned.  Neurology: Awake and alert, non focal  E ENT: no pallor, no icterus, oral mucosa moist Cardiovascular: No JVD. S1-S2 present, rhythmic, no gallops, rubs, or murmurs. Pitting ++ bilateral lower extremity edema. Pulmonary: vesicular breath sounds bilaterally, adequate air movement, no  wheezing, rhonchi or rales. Gastrointestinal. Abdomen with no organomegaly, non tender, no rebound or guarding Skin. No rashes Musculoskeletal: no joint deformities     Data Reviewed: I have personally reviewed following labs and imaging studies  CBC: Recent Labs  Lab 03/08/18 1552 03/09/18 0554 03/14/18 0507  WBC 5.4 5.4 14.0*  NEUTROABS 3.7  --  11.5*  HGB 8.7* 8.7* 8.5*  HCT 28.8* 29.2* 26.5*  MCV 106.3* 108.6* 99.6  PLT 100* 95* 105*   Basic Metabolic Panel: Recent Labs  Lab 03/11/18 0833 03/12/18 0637 03/13/18 0547 03/14/18 0507 03/15/18 0616  NA 141 141 142 140 140  K 4.9 4.3 4.2 4.1 3.9  CL 113* 112* 107 109 106  CO2 16* 20* 21* 23 23  GLUCOSE 106* 106* 116* 121* 135*  BUN 65* 68* 74* 82* 97*  CREATININE 3.36* 3.42* 3.30* 3.41* 3.54*  CALCIUM 8.3* 8.2* 8.3* 7.7* 7.7*  PHOS 3.9 4.1  --   --   --    GFR: Estimated Creatinine Clearance: 15.3 mL/min (A) (by C-G formula based on SCr of 3.54 mg/dL (H)). Liver Function Tests: Recent Labs  Lab 03/08/18 1552 03/11/18 0833 03/12/18 0637  AST 30  --   --   ALT 22  --   --   ALKPHOS 28*  --   --   BILITOT 1.7*  --   --   PROT 6.5  --   --   ALBUMIN 2.6* 2.5* 2.4*   No results for input(s): LIPASE, AMYLASE in the last 168 hours. No results for input(s): AMMONIA in the last 168 hours. Coagulation Profile: Recent Labs  Lab 03/11/18 0444 03/12/18 0637 03/13/18 0547 03/14/18 0507 03/15/18 0616  INR 3.30 2.64 2.12 2.11 2.13   Cardiac Enzymes: Recent Labs  Lab 03/08/18 1552 03/08/18 2350 03/09/18 0554  TROPONINI 0.06* 0.06* 0.05*   BNP (last 3 results) Recent Labs    10/14/17 1209  PROBNP 15,115*   HbA1C: No results for input(s): HGBA1C in the last 72 hours. CBG: Recent Labs  Lab 03/08/18 1524  GLUCAP 105*   Lipid Profile: No results for input(s): CHOL, HDL, LDLCALC, TRIG, CHOLHDL, LDLDIRECT in the last 72 hours. Thyroid Function Tests: No results for input(s): TSH, T4TOTAL, FREET4,  T3FREE, THYROIDAB in the last 72 hours. Anemia Panel: Recent Labs    03/14/18 0507  FERRITIN 226  TIBC 200*  IRON 24*      Radiology Studies: I have reviewed all of the imaging during this hospital visit personally     Scheduled Meds: . amiodarone  400 mg Oral Daily  . atorvastatin  40 mg Oral q1800  . feeding supplement (ENSURE ENLIVE)  237 mL Oral TID BM  . furosemide  80 mg Intravenous BID  . multivitamin with minerals  1 tablet Oral Daily  . Warfarin - Pharmacist Dosing Inpatient   Does not apply q1800   Continuous Infusions: . cefTRIAXone (ROCEPHIN)  IV 1 g (03/14/18 1600)     LOS: 7 days  Talya Quain Gerome Apley, MD [facility] Pager [phone]

## 2018-03-15 NOTE — Progress Notes (Signed)
S:No new complaints O:BP 100/63 (BP Location: Left Arm)   Pulse 66   Temp 99 F (37.2 C) (Oral)   Resp 19   Ht 5\' 8"  (1.727 m)   Wt 76.7 kg (169 lb)   SpO2 100%   BMI 25.70 kg/m   Intake/Output Summary (Last 24 hours) at 03/15/2018 1455 Last data filed at 03/15/2018 1404 Gross per 24 hour  Intake 1169.86 ml  Output 800 ml  Net 369.86 ml   Intake/Output: I/O last 3 completed shifts: In: 1359.9 [P.O.:997; IV Piggyback:362.9] Out: 1775 [Urine:1775]  Intake/Output this shift:  Total I/O In: 210 [P.O.:180; I.V.:30] Out: -  Weight change: -0.454 kg (-1 lb) Gen: Frail, chronically ill-appearing AAM in NAD CVS: no rub Resp: cta Abd: benign Ext: trace pedal edema  Recent Labs  Lab 03/08/18 1552  03/09/18 1529 03/10/18 0815 03/11/18 0865 03/12/18 7846 03/13/18 0547 03/14/18 0507 03/15/18 0616  NA 141   < > 143 142 141 141 142 140 140  K 5.7*   < > 5.4* 5.2* 4.9 4.3 4.2 4.1 3.9  CL 121*   < > 121* 118* 113* 112* 107 109 106  CO2 14*   < > 16* 16* 16* 20* 21* 23 23  GLUCOSE 111*   < > 124* 105* 106* 106* 116* 121* 135*  BUN 53*   < > 55* 58* 65* 68* 74* 82* 97*  CREATININE 3.45*   < > 3.52* 3.50* 3.36* 3.42* 3.30* 3.41* 3.54*  ALBUMIN 2.6*  --   --   --  2.5* 2.4*  --   --   --   CALCIUM 8.3*   < > 8.3* 8.2* 8.3* 8.2* 8.3* 7.7* 7.7*  PHOS  --   --   --   --  3.9 4.1  --   --   --   AST 30  --   --   --   --   --   --   --   --   ALT 22  --   --   --   --   --   --   --   --    < > = values in this interval not displayed.   Liver Function Tests: Recent Labs  Lab 03/08/18 1552 03/11/18 0833 03/12/18 0637  AST 30  --   --   ALT 22  --   --   ALKPHOS 28*  --   --   BILITOT 1.7*  --   --   PROT 6.5  --   --   ALBUMIN 2.6* 2.5* 2.4*   No results for input(s): LIPASE, AMYLASE in the last 168 hours. No results for input(s): AMMONIA in the last 168 hours. CBC: Recent Labs  Lab 03/08/18 1552 03/09/18 0554 03/14/18 0507  WBC 5.4 5.4 14.0*  NEUTROABS 3.7  --  11.5*   HGB 8.7* 8.7* 8.5*  HCT 28.8* 29.2* 26.5*  MCV 106.3* 108.6* 99.6  PLT 100* 95* 105*   Cardiac Enzymes: Recent Labs  Lab 03/08/18 1552 03/08/18 2350 03/09/18 0554  TROPONINI 0.06* 0.06* 0.05*   CBG: Recent Labs  Lab 03/08/18 1524  GLUCAP 105*    Iron Studies:  Recent Labs    03/14/18 0507  IRON 24*  TIBC 200*  FERRITIN 226   Studies/Results: No results found. [name] amiodarone  400 mg Oral Daily  . atorvastatin  40 mg Oral q1800  . feeding supplement (ENSURE ENLIVE)  237 mL Oral TID BM  . [  START ON 03/16/2018] furosemide  80 mg Oral Daily  . multivitamin with minerals  1 tablet Oral Daily  . warfarin  4.5 mg Oral ONCE-1800  . Warfarin - Pharmacist Dosing Inpatient   Does not apply q1800    BMET    Component Value Date/Time   NA 140 03/15/2018 0616   NA 141 10/26/2017 1507   K 3.9 03/15/2018 0616   CL 106 03/15/2018 0616   CO2 23 03/15/2018 0616   GLUCOSE 135 (H) 03/15/2018 0616   BUN 97 (H) 03/15/2018 0616   BUN 44 (H) 10/26/2017 1507   CREATININE 3.54 (H) 03/15/2018 0616   CREATININE 1.79 (H) 11/15/2014 1617   CALCIUM 7.7 (L) 03/15/2018 0616   GFRNONAA 15 (L) 03/15/2018 0616   GFRAA 17 (L) 03/15/2018 0616   CBC    Component Value Date/Time   WBC 14.0 (H) 03/14/2018 0507   RBC 2.66 (L) 03/14/2018 0507   HGB 8.5 (L) 03/14/2018 0507   HGB 10.5 (L) 03/29/2017 1556   HCT 26.5 (L) 03/14/2018 0507   HCT 32.9 (L) 03/29/2017 1556   PLT 105 (L) 03/14/2018 0507   PLT 104 (L) 03/29/2017 1556   MCV 99.6 03/14/2018 0507   MCV 99 (H) 03/29/2017 1556   MCH 32.0 03/14/2018 0507   MCHC 32.1 03/14/2018 0507   RDW 15.5 03/14/2018 0507   RDW 12.6 03/29/2017 1556   LYMPHSABS 0.7 03/14/2018 0507   LYMPHSABS 1.4 03/29/2017 1556   MONOABS 1.8 (H) 03/14/2018 0507   EOSABS 0.0 03/14/2018 0507   EOSABS 0.5 (H) 03/29/2017 1556   BASOSABS 0.0 03/14/2018 0507   BASOSABS 0.0 03/29/2017 1556    Assessment/Plan:  1. AKI/CKD stage 4 with chronic right hydronephrosis  (right kidney 12.9cm with severe parenchymal atrophy, LK 14.7 with increased echogenicity). Presumably due to cardiorenal syndrome +/- chronic obstruction leading to atrophic renal parenchyma. Cr has stabilized to slightly improved despite diuresis. Continue to follow. No indication for dialysis at this time and is not a suitable candidate for HD given his poor functional and nutritional status.  Agree with hospice/palliative care and conservative management. 2. Obstructive uropathy- f/u with Urology 3. Acute on chronic systolic CHF/NICMP EF 20-25% with diffuse hypokinesis. Diuresing with IV lasix.  Agree with changing to po lasix. 4. Atrial fibrillation- rate controlled. On coumadin 5. UTI- on abx 6. Anemia of chronic disease- will check iron stores and consider ESA 7. Deconditioning- per primary 8. FTT in an adult- agree with palliative care recommendation for hospice services.  9. Disposition- poor overall prognosis and agree with palliative care consult to help set goals/limits of care. He is not a dialysis candidate given his advanced age and poor functional and nutritional status.  He has had progressive CKD for the last several years.  For d/c to [facility] place pending approval.  Will sign off and follow peripherally.  Nothing new to add and no need for outpatient follow up as he will be referred for outpatient palliative care and hospice services.    Irena Cords, MD [employer] [phone]

## 2018-03-16 DIAGNOSIS — I5022 Chronic systolic (congestive) heart failure: Secondary | ICD-10-CM

## 2018-03-16 DIAGNOSIS — I480 Paroxysmal atrial fibrillation: Secondary | ICD-10-CM

## 2018-03-16 LAB — PROTIME-INR
INR: 2.18
Prothrombin Time: 24.1 seconds — ABNORMAL HIGH (ref 11.4–15.2)

## 2018-03-16 LAB — BASIC METABOLIC PANEL
ANION GAP: 13 (ref 5–15)
BUN: 110 mg/dL — ABNORMAL HIGH (ref 8–23)
CHLORIDE: 104 mmol/L (ref 98–111)
CO2: 24 mmol/L (ref 22–32)
Calcium: 7.8 mg/dL — ABNORMAL LOW (ref 8.9–10.3)
Creatinine, Ser: 3.72 mg/dL — ABNORMAL HIGH (ref 0.61–1.24)
GFR calc non Af Amer: 14 mL/min — ABNORMAL LOW (ref >60)
GFR, EST AFRICAN AMERICAN: 16 mL/min — AB (ref >60)
Glucose, Bld: 132 mg/dL — ABNORMAL HIGH (ref 70–99)
Potassium: 4 mmol/L (ref 3.5–5.1)
Sodium: 141 mmol/L (ref 135–145)

## 2018-03-16 LAB — IMMUNOFIXATION ELECTROPHORESIS
IGA: 792 mg/dL — AB (ref 61–437)
IGG (IMMUNOGLOBIN G), SERUM: 1239 mg/dL (ref 700–1600)
IgM (Immunoglobulin M), Srm: 61 mg/dL (ref 15–143)
TOTAL PROTEIN ELP: 6.1 g/dL (ref 6.0–8.5)

## 2018-03-16 MED ORDER — WARFARIN SODIUM 3 MG PO TABS
3.0000 mg | ORAL_TABLET | Freq: Once | ORAL | Status: AC
  Administered 2018-03-16: 3 mg via ORAL
  Filled 2018-03-16: qty 1

## 2018-03-16 NOTE — Progress Notes (Signed)
PROGRESS NOTE    Tyler Gray  TDD:[id number] DOB: [birth year] DOA: 03/08/2018 PCP: Renaye Rakers, MD    Brief Narrative:  84 year old male who presented with weakness and fatigue.  He does have a significant past medical history for nonischemic cardiomyopathy, paroxysmal atrial fibrillation, chronic systolic heart failure status post AICD, dyslipidemia chronic kidney disease stage III.  Patient was found by nursing confused and weak, along with dyspnea and lower extremity edema.  On his initial physical examination blood pressure 104/62, heart rate 61, temperature 97.8, respiratory rate 16, oxygen saturation 100%.  He has dry mucous membranes, lungs are clear to auscultation bilaterally, heart S1-S2 present and rhythmic, the abdomen was soft nontender, positive lower extremity edema 2+ up to the sacrum.  Sodium 142, potassium 4.2, chloride 107, bicarb 21, was 116, BUN 74, creatinine 3.30, white count 14.0, hemoglobin 8.5 hematocrit 26.5, platelets 105, INR 2.1.  Urinalysis specific gravity 1.009, white cells greater than 50 RBCs 11-20.   Patient was admitted to the hospital with working diagnosis of decompensated systolic heart failure, complicated by acute kidney injury and hyperkalemia.    Assessment & Plan:   Active Problems:   Paroxysmal atrial fibrillation (HCC)   CHRONIC SYSTOLIC HEART FAILURE   Chronic kidney disease, stage IV (severe) (HCC)   Automatic implantable cardioverter-defibrillator in situ   Goals of care, counseling/discussion   AKI (acute kidney injury) (HCC)   Acute lower UTI   Acute systolic CHF (congestive heart failure) (HCC)   Elevated INR   Hyperkalemia   Elevated troponin   Malnutrition of moderate degree   Pressure injury of skin   Palliative care by specialist   1. Acute on chronic systolic heart failure. Patient continue to be diuresing well with urine output over last 24 hours up to 2,200 cc, continue heart failure management with amiodarone. No ace  inh due to aki.   2. AKI on CKD stage 3. Renal function with worsening cr today at 3,72, will continue to follow on renal function in am, patient clinically still hypervolemic. K ar 4,0 and serum bicarbonate at 24. Continue oral furosemide for now.   3. Chronic atrial fibrillation. Tolerating well amiodarone for rate control, anticoagulation with warfarin INR at 2.1  4. Urine tract infection. Off antibiotics.     DVT prophylaxis: warfarin   Code Status: full Family Communication: no family at the bedside today.   Disposition Plan: will need snf in am if renal function stable.    Consultants:    Cariology   Nephrology    Procedures:     Antimicrobials:      Subjective: Patient is feeling well this am, no dyspnea or chest pain, no nausea or vomiting, positive edema on right upper extremity and lower extremities.   Objective: Vitals:   03/15/18 1206 03/15/18 1400 03/15/18 1943 03/16/18 0501  BP: 103/64 100/63 98/65 93/63   Pulse: (!) 56 66 67 69  Resp: 20 19 18 18   Temp: 99 F (37.2 C)  98.6 F (37 C) 99.2 F (37.3 C)  TempSrc: Oral  Oral Oral  SpO2: 100% 100% 100% 99%  Weight:    76.2 kg (168 lb)  Height:        Intake/Output Summary (Last 24 hours) at 03/16/2018 1221 Last data filed at 03/16/2018 1129 Gross per 24 hour  Intake 1354.9 ml  Output 2000 ml  Net -645.1 ml   Filed Weights   03/14/18 0557 03/15/18 0400 03/16/18 0501  Weight: 77.1 kg (170 lb) 76.7 kg (169  lb) 76.2 kg (168 lb)    Examination:   General: deconditioned  Neurology: Awake and alert, non focal  E ENT: mild pallor, no icterus, oral mucosa moist Cardiovascular: No JVD. S1-S2 present, rhythmic, no gallops, rubs, or murmurs. Pitting +/++ bilateral lower extremity edema. Pulmonary: positive breath sounds bilaterally, adequate air movement, no wheezing, rhonchi or rales. Gastrointestinal. Abdomen with no organomegaly, non tender, no rebound or guarding Skin. No  rashes Musculoskeletal: no joint deformities     Data Reviewed: I have personally reviewed following labs and imaging studies  CBC: Recent Labs  Lab 03/14/18 0507  WBC 14.0*  NEUTROABS 11.5*  HGB 8.5*  HCT 26.5*  MCV 99.6  PLT 105*   Basic Metabolic Panel: Recent Labs  Lab 03/11/18 0833 03/12/18 0637 03/13/18 0547 03/14/18 0507 03/15/18 0616 03/16/18 0617  NA 141 141 142 140 140 141  K 4.9 4.3 4.2 4.1 3.9 4.0  CL 113* 112* 107 109 106 104  CO2 16* 20* 21* 23 23 24   GLUCOSE 106* 106* 116* 121* 135* 132*  BUN 65* 68* 74* 82* 97* 110*  CREATININE 3.36* 3.42* 3.30* 3.41* 3.54* 3.72*  CALCIUM 8.3* 8.2* 8.3* 7.7* 7.7* 7.8*  PHOS 3.9 4.1  --   --   --   --    GFR: Estimated Creatinine Clearance: 14.6 mL/min (A) (by C-G formula based on SCr of 3.72 mg/dL (H)). Liver Function Tests: Recent Labs  Lab 03/11/18 0833 03/12/18 0637  ALBUMIN 2.5* 2.4*   No results for input(s): LIPASE, AMYLASE in the last 168 hours. No results for input(s): AMMONIA in the last 168 hours. Coagulation Profile: Recent Labs  Lab 03/12/18 0637 03/13/18 0547 03/14/18 0507 03/15/18 0616 03/16/18 0617  INR 2.64 2.12 2.11 2.13 2.18   Cardiac Enzymes: No results for input(s): CKTOTAL, CKMB, CKMBINDEX, TROPONINI in the last 168 hours. BNP (last 3 results) Recent Labs    10/14/17 1209  PROBNP 15,115*   HbA1C: No results for input(s): HGBA1C in the last 72 hours. CBG: No results for input(s): GLUCAP in the last 168 hours. Lipid Profile: No results for input(s): CHOL, HDL, LDLCALC, TRIG, CHOLHDL, LDLDIRECT in the last 72 hours. Thyroid Function Tests: No results for input(s): TSH, T4TOTAL, FREET4, T3FREE, THYROIDAB in the last 72 hours. Anemia Panel: Recent Labs    03/14/18 0507  FERRITIN 226  TIBC 200*  IRON 24*      Radiology Studies: I have reviewed all of the imaging during this hospital visit personally     Scheduled Meds: . amiodarone  400 mg Oral Daily  .  atorvastatin  40 mg Oral q1800  . feeding supplement (ENSURE ENLIVE)  237 mL Oral TID BM  . furosemide  80 mg Oral Daily  . multivitamin with minerals  1 tablet Oral Daily  . warfarin  3 mg Oral ONCE-1800  . Warfarin - Pharmacist Dosing Inpatient   Does not apply q1800   Continuous Infusions:   LOS: 8 days        Tyler Vizcarrondo Annett Gula, MD [facility] Pager [phone]

## 2018-03-16 NOTE — Progress Notes (Signed)
Progress Note  Patient Name: Tyler Gray Date of Encounter: 03/16/2018  Primary Cardiologist: Lesleigh Noe, MD   Subjective   Mr. Benzie is clinically improved today.  He denies shortness of breath.  His peripheral edema has improved as well with IV diuresis now transitioning to p.o.  Inpatient Medications    Scheduled Meds: . amiodarone  400 mg Oral Daily  . atorvastatin  40 mg Oral q1800  . feeding supplement (ENSURE ENLIVE)  237 mL Oral TID BM  . furosemide  80 mg Oral Daily  . multivitamin with minerals  1 tablet Oral Daily  . Warfarin - Pharmacist Dosing Inpatient   Does not apply q1800   Continuous Infusions: . cefTRIAXone (ROCEPHIN)  IV 1 g (03/15/18 1145)   PRN Meds: acetaminophen, ondansetron **OR** ondansetron (ZOFRAN) IV, polyethylene glycol   Vital Signs    Vitals:   03/15/18 1206 03/15/18 1400 03/15/18 1943 03/16/18 0501  BP: 103/64 100/63 98/65 93/63   Pulse: (!) 56 66 67 69  Resp: 20 19 18 18   Temp: 99 F (37.2 C)  98.6 F (37 C) 99.2 F (37.3 C)  TempSrc: Oral  Oral Oral  SpO2: 100% 100% 100% 99%  Weight:    168 lb (76.2 kg)  Height:        Intake/Output Summary (Last 24 hours) at 03/16/2018 0803 Last data filed at 03/16/2018 0746 Gross per 24 hour  Intake 1144.9 ml  Output 1800 ml  Net -655.1 ml   Filed Weights   03/14/18 0557 03/15/18 0400 03/16/18 0501  Weight: 170 lb (77.1 kg) 169 lb (76.7 kg) 168 lb (76.2 kg)    Telemetry    Atrially sensed, ventricularly paced at 70- Personally Reviewed  ECG    Not performed today- Personally Reviewed  Physical Exam   GEN: No acute distress.   Neck: No JVD Cardiac: RRR, no murmurs, rubs, or gallops.  Respiratory: Clear to auscultation bilaterally. GI: Soft, nontender, non-distended  MS: No edema; No deformity. Neuro:  Nonfocal  Psych: Normal affect  Extremities: Minimal peripheral edema  Labs    Chemistry Recent Labs  Lab 03/11/18 2330 03/12/18 0637  03/14/18 0507  03/15/18 0616 03/16/18 0617  NA 141 141   < > 140 140 141  K 4.9 4.3   < > 4.1 3.9 4.0  CL 113* 112*   < > 109 106 104  CO2 16* 20*   < > 23 23 24   GLUCOSE 106* 106*   < > 121* 135* 132*  BUN 65* 68*   < > 82* 97* 110*  CREATININE 3.36* 3.42*   < > 3.41* 3.54* 3.72*  CALCIUM 8.3* 8.2*   < > 7.7* 7.7* 7.8*  ALBUMIN 2.5* 2.4*  --   --   --   --   GFRNONAA 16* 15*   < > 15* 15* 14*  GFRAA 18* 18*   < > 18* 17* 16*  ANIONGAP 12 9   < > 8 11 13    < > = values in this interval not displayed.     Hematology Recent Labs  Lab 03/14/18 0507  WBC 14.0*  RBC 2.66*  HGB 8.5*  HCT 26.5*  MCV 99.6  MCH 32.0  MCHC 32.1  RDW 15.5  PLT 105*    Cardiac EnzymesNo results for input(s): TROPONINI in the last 168 hours. No results for input(s): TROPIPOC in the last 168 hours.   BNPNo results for input(s): BNP, PROBNP in the last 168  hours.   DDimer No results for input(s): DDIMER in the last 168 hours.   Radiology    No results found.  Cardiac Studies   2D echocardiogram (03/09/2018)  Study Conclusions  - Left ventricle: The cavity size was severely dilated. Systolic   function was severely reduced. The estimated ejection fraction   was in the range of 20% to 25%. Diffuse hypokinesis. There is   akinesis of the inferoseptal myocardium. The study is not   technically sufficient to allow evaluation of LV diastolic   function. - Aortic valve: There was mild regurgitation. - Aorta: Aortic root dimension: 43 mm (ED). - Ascending aorta: The ascending aorta was mildly dilated. - Mitral valve: Mildly thickened leaflets . There was moderate to   severe regurgitation directed posteriorly. - Left atrium: The atrium was severely dilated. - Right ventricle: The cavity size was moderately dilated. Wall   thickness was normal. Pacer wire or catheter noted in right   ventricle. - Right atrium: The atrium was severely dilated. - Tricuspid valve: There was severe regurgitation. - Pulmonary  arteries: Systolic pressure was moderately increased.   PA peak pressure: 50 mm Hg (S).  Patient Profile     84 y.o. male with past medical history of nonischemic cardiomyopathy, atrial fibrillation on chronic Coumadin, chronic systolic congestive heart failure, prior BivICD with nonfunctional atrial lead, chronic stage III kidney disease, beingfollowed by cardiologyfor acute on chronic systolic congestive heart failure.     Assessment & Plan    1: Acute on chronic systolic/nonischemic heart failure- patient was admitted with volume overload.  He was diuresed 4.6 L.  He was transition from IV to p.o. furosemide.  His serum creatinine has steadily increased now to 3.7.  He appears euvolemic on exam today.  We will continue to monitor.  2: Atrial fibrillation- amiodarone started not 400 mg a day with anticipation of reducing to 200 mg a day on July 28.  He is on warfarin and we will be cognizant of drug drug interaction.  3: Acute on chronic renal insufficiency- serum creatinine steadily rising now 3.7.  We are walking a fine line between volume status and renal function.  Appreciate renal services help.       For questions or updates, please contact [facility] Please consult [email or web address] for contact info under Cardiology/STEMI.      Signed, Nanetta Batty, MD  03/16/2018, 8:03 AM

## 2018-03-16 NOTE — Progress Notes (Signed)
Patient strength has improved today/tonight, may be due to decreased pain in legs. Although, thighs and sacral area present with increased pitting edema.

## 2018-03-16 NOTE — Progress Notes (Signed)
ANTICOAGULATION CONSULT NOTE   Pharmacy Consult for Warfarin Indication: atrial fibrillation  No Known Allergies  Labs: Recent Labs    03/14/18 0507 03/15/18 0616 03/16/18 0617  HGB 8.5*  --   --   HCT 26.5*  --   --   PLT 105*  --   --   LABPROT 23.5* 23.6* 24.1*  INR 2.11 2.13 2.18  CREATININE 3.41* 3.54* 3.72*   Assessment: 84 yo male admitted for generalized weakness. On Warfarin PTA for Atrial fibrillation.  On admission, INR was supratherapeutic at 3.6. Warfarin held for the last few days until INR came down to the therapeutic range, and Warfarin resumed.    INR Today: 2.18, no updated CBC today, no overt bleeding noted  Home Warfarin regimen:  3 mg alternating with 4.5 mg every other day.  Meds Reviewed: Interaction with amiodarone (started on 6/30)  Goal of Therapy:  INR 2-3   Plan:  Warfarin 3 mg this evening. Daily H&H and INR, redosing the Warfarin accordingly.  Jeanella Cara, PharmD, [facility] Clinical Pharmacist 03/16/2018 8:12 AM Please check AMION for all [facility] numbers

## 2018-03-17 DIAGNOSIS — R791 Abnormal coagulation profile: Secondary | ICD-10-CM

## 2018-03-17 LAB — BASIC METABOLIC PANEL
ANION GAP: 11 (ref 5–15)
BUN: 119 mg/dL — ABNORMAL HIGH (ref 8–23)
CALCIUM: 7.8 mg/dL — AB (ref 8.9–10.3)
CHLORIDE: 105 mmol/L (ref 98–111)
CO2: 25 mmol/L (ref 22–32)
Creatinine, Ser: 4.06 mg/dL — ABNORMAL HIGH (ref 0.61–1.24)
GFR calc Af Amer: 14 mL/min — ABNORMAL LOW (ref >60)
GFR calc non Af Amer: 12 mL/min — ABNORMAL LOW (ref >60)
GLUCOSE: 122 mg/dL — AB (ref 70–99)
POTASSIUM: 4.3 mmol/L (ref 3.5–5.1)
Sodium: 141 mmol/L (ref 135–145)

## 2018-03-17 LAB — PROTIME-INR
INR: 2.32
PROTHROMBIN TIME: 25.3 s — AB (ref 11.4–15.2)

## 2018-03-17 MED ORDER — WARFARIN SODIUM 2.5 MG PO TABS
4.5000 mg | ORAL_TABLET | Freq: Once | ORAL | Status: AC
  Administered 2018-03-17: 4.5 mg via ORAL
  Filled 2018-03-17: qty 1

## 2018-03-17 NOTE — Progress Notes (Signed)
ANTICOAGULATION CONSULT NOTE   Pharmacy Consult for Warfarin Indication: atrial fibrillation  No Known Allergies  Labs: Recent Labs    03/15/18 0616 03/16/18 0617 03/17/18 0527  LABPROT 23.6* 24.1* 25.3*  INR 2.13 2.18 2.32  CREATININE 3.54* 3.72* 4.06*   Assessment: 84 yo male admitted for generalized weakness. On Warfarin PTA for Atrial fibrillation.  On admission, INR was supratherapeutic at 3.6. Warfarin held for the last few days until INR came down to the therapeutic range, and Warfarin resumed.    INR Today: 2.18, no updated CBC today, no overt bleeding noted  Home Warfarin regimen:  3 mg alternating with 4.5 mg every other day.  Meds Reviewed: Interaction with amiodarone (started on 6/30)  Goal of Therapy:  INR 2-3   Plan:  Warfarin 4.5 mg PO tonight Daily INR Monitor for s/sx of bleeding   Loura Back, PharmD, BCPS Clinical Pharmacist Clinical phone for 03/17/2018 until 3p is [phone] Please check AMION for all Pharmacist numbers by unit 03/17/2018 10:38 AM

## 2018-03-17 NOTE — Plan of Care (Signed)
  Problem: Activity: Goal: Risk for activity intolerance will decrease Outcome: Progressing   Problem: Nutrition: Goal: Adequate nutrition will be maintained Outcome: Progressing   Problem: Pain Managment: Goal: General experience of comfort will improve Outcome: Progressing   

## 2018-03-17 NOTE — Clinical Social Work Note (Signed)
Per MD, patient will likely discharge tomorrow. CSW faxed today's PT note to [facility] for review. Authorization approval date changed to 7/6.   Charlynn Court, CSW [phone]

## 2018-03-17 NOTE — Progress Notes (Signed)
Progress Note  Patient Name: Tyler Gray Date of Encounter: 03/17/2018  Primary Cardiologist: Lesleigh Noe, MD   Subjective   No dyspnea. Diuretics on hold for rising BUN/creatinine. Weight has decreased abruptly today (accuracy?).  Inpatient Medications    Scheduled Meds: . amiodarone  400 mg Oral Daily  . atorvastatin  40 mg Oral q1800  . feeding supplement (ENSURE ENLIVE)  237 mL Oral TID BM  . multivitamin with minerals  1 tablet Oral Daily  . Warfarin - Pharmacist Dosing Inpatient   Does not apply q1800   Continuous Infusions:  PRN Meds: acetaminophen, ondansetron **OR** ondansetron (ZOFRAN) IV, polyethylene glycol   Vital Signs    Vitals:   03/16/18 0501 03/16/18 1238 03/16/18 1929 03/17/18 0455  BP: 93/63 (!) 97/59 98/64 103/73  Pulse: 69 67 (!) 58 71  Resp: 18 16 16 18   Temp: 99.2 F (37.3 C) 98.5 F (36.9 C) 98.6 F (37 C) 99.5 F (37.5 C)  TempSrc: Oral Oral Oral Oral  SpO2: 99% 100% 100% 100%  Weight: 168 lb (76.2 kg)   163 lb (73.9 kg)  Height:        Intake/Output Summary (Last 24 hours) at 03/17/2018 1036 Last data filed at 03/17/2018 0900 Gross per 24 hour  Intake 1037 ml  Output 701 ml  Net 336 ml   Filed Weights   03/15/18 0400 03/16/18 0501 03/17/18 0455  Weight: 169 lb (76.7 kg) 168 lb (76.2 kg) 163 lb (73.9 kg)    Telemetry    AFib, mostly V paced, but many fused and native AV conduction beats - Personally Reviewed  ECG    No new tracing - Personally Reviewed  Physical Exam  Elderly and frail GEN: No acute distress.   Neck: No JVD Cardiac: irregular, no murmurs, rubs, or gallops.  Respiratory: Clear to auscultation bilaterally. GI: Soft, nontender, non-distended  MS: No edema; No deformity. Neuro:  Nonfocal  Psych: Normal affect   Labs    Chemistry Recent Labs  Lab 03/11/18 [phone] 03/12/18 9604  03/15/18 0616 03/16/18 0617 03/17/18 0527  NA 141 141   < > 140 141 141  K 4.9 4.3   < > 3.9 4.0 4.3  CL 113*  112*   < > 106 104 105  CO2 16* 20*   < > 23 24 25   GLUCOSE 106* 106*   < > 135* 132* 122*  BUN 65* 68*   < > 97* 110* 119*  CREATININE 3.36* 3.42*   < > 3.54* 3.72* 4.06*  CALCIUM 8.3* 8.2*   < > 7.7* 7.8* 7.8*  ALBUMIN 2.5* 2.4*  --   --   --   --   GFRNONAA 16* 15*   < > 15* 14* 12*  GFRAA 18* 18*   < > 17* 16* 14*  ANIONGAP 12 9   < > 11 13 11    < > = values in this interval not displayed.     Hematology Recent Labs  Lab 03/14/18 0507  WBC 14.0*  RBC 2.66*  HGB 8.5*  HCT 26.5*  MCV 99.6  MCH 32.0  MCHC 32.1  RDW 15.5  PLT 105*    Cardiac EnzymesNo results for input(s): TROPONINI in the last 168 hours. No results for input(s): TROPIPOC in the last 168 hours.   BNPNo results for input(s): BNP, PROBNP in the last 168 hours.   DDimer No results for input(s): DDIMER in the last 168 hours.   Radiology  No results found.  Cardiac Studies   Study Conclusions  - Left ventricle: The cavity size was severely dilated. Systolic function was severely reduced. The estimated ejection fraction was in the range of 20% to 25%. Diffuse hypokinesis. There is akinesis of the inferoseptal myocardium. The study is not technically sufficient to allow evaluation of LV diastolic function. - Aortic valve: There was mild regurgitation. - Aorta: Aortic root dimension: 43 mm (ED). - Ascending aorta: The ascending aorta was mildly dilated. - Mitral valve: Mildly thickened leaflets . There was moderate to severe regurgitation directed posteriorly. - Left atrium: The atrium was severely dilated. - Right ventricle: The cavity size was moderately dilated. Wall thickness was normal. Pacer wire or catheter noted in right ventricle. - Right atrium: The atrium was severely dilated. - Tricuspid valve: There was severe regurgitation. - Pulmonary arteries: Systolic pressure was moderately increased. PA peak pressure: 50 mm Hg (S).  Patient Profile     84 y.o. male with  past medical history of nonischemic cardiomyopathy, atrial fibrillation on chronic Coumadin, chronic systolic congestive heart failure, prior BivICD with nonfunctional atrial lead, chronic stage IV kidney disease, beingfollowed by cardiologyfor acute on chronic systolic congestive heart failure  Assessment & Plan    Compensated CHF, diuretics on hold for acute on chronic renal insufficiency. Poor CRT efficiency due to difficulty with atrial fibrillation rate control, just restarted on amiodarone.  [facility] will sign off.    Medication Recommendations:  Resume oral furosemide 40 mg daily when renal function stabilizes, titrate diuretics for weight 75-77 kg; amiodarone 400 mg through end of July, then 200 mg daily  Other recommendations (labs, testing, etc):  Frequent monitoring of BMET and INR (note amio-warfarin interaction, has [facility]in [facility] appt scheduled 03/29/2018), CMET and TSH Q 6 months  Follow up as an outpatient: Has remote device check scheduled 04/11/2018 F/U Dr. Ladona Ridgel 3 months later  For questions or updates, please contact [facility] Please consult [email or web address] for contact info under Cardiology/STEMI.      Signed, Thurmon Fair, MD  03/17/2018, 10:36 AM

## 2018-03-17 NOTE — Progress Notes (Signed)
Physical Therapy Treatment Patient Details Name: Tyler Gray MRN: [id number] DOB: [birth year] Today's Date: 03/17/2018    History of Present Illness Tyler Gray is a 84 y.o. male with medical history significant for nonischemic cardiomyopathy, paroxysmal atrial fibrillation on warfarin, chronic systolic heart failure, AICD, hyperlipidemia, and chronic kidney disease stage III    PT Comments    Patient progressing with mobility during session with improved transfers and standing tolerance after several bouts of standing.  Goals updated this session as overall declined from initial evaluation.  Feel he remains appropriate for SNF level rehab at d/c.  PT to follow acutely.   Follow Up Recommendations  SNF;Supervision/Assistance - 24 hour     Equipment Recommendations  Other (comment)(TBA)    Recommendations for Other Services       Precautions / Restrictions Precautions Precautions: Fall Restrictions Weight Bearing Restrictions: No    Mobility  Bed Mobility Overal bed mobility: Needs Assistance Bed Mobility: Supine to Sit     Supine to sit: Mod assist     General bed mobility comments: assist for legs off bed and pt pulled up with assist to sit; able to scoot to EOB with increased time and cues  Transfers Overall transfer level: Needs assistance   Transfers: Sit to/from Stand;Stand Pivot Transfers Sit to Stand: Max assist;+2 safety/equipment;From elevated surface Stand pivot transfers: Total assist       General transfer comment: up from EOB to stedy 2 trials needed, then to recliner standing from stedy with improved technique; then pt reported needing to have BM so up to stedy from recliner with built up seat with cues and assist to [facility] pt already having stool to [facility], then able to stand from [facility] for at least 30 seconds for hygiene then back to chair in stedy  Ambulation/Gait                 Stairs             Wheelchair Mobility    Modified  Rankin (Stroke Patients Only)       Balance Overall balance assessment: Needs assistance Sitting-balance support: Feet supported Sitting balance-Leahy Scale: Fair     Standing balance support: Bilateral upper extremity supported Standing balance-Leahy Scale: Poor Standing balance comment: improving balance throughout session with more attempts at standing so that able to stand with mod A with UE support for hygiene about 30-40 seconds                            Cognition Arousal/Alertness: Awake/alert Behavior During Therapy: WFL for tasks assessed/performed Overall Cognitive Status: History of cognitive impairments - at baseline                                        Exercises General Exercises - Upper Extremity Shoulder Flexion: AAROM;5 reps;Both;Supine Elbow Extension: AAROM;Both;5 reps;Supine General Exercises - Lower Extremity Ankle Circles/Pumps: AROM;10 reps;Both;Supine Heel Slides: AROM;AAROM;5 reps;Both;Supine    General Comments        Pertinent Vitals/Pain Faces Pain Scale: Hurts whole lot Pain Location: BIL Knees, L>R Pain Descriptors / Indicators: Grimacing;Guarding Pain Intervention(s): Monitored during session;Limited activity within patient's tolerance    Home Living                      Prior Function  PT Goals (current goals can now be found in the care plan section) Acute Rehab PT Goals Patient Stated Goal: get stronger PT Goal Formulation: With patient Time For Goal Achievement: 03/31/18 Potential to Achieve Goals: Fair Progress towards PT goals: Goals downgraded-see care plan    Frequency    Min 2X/week      PT Plan Current plan remains appropriate    Co-evaluation              AM-PAC PT "6 Clicks" Daily Activity  Outcome Measure  Difficulty turning over in bed (including adjusting bedclothes, sheets and blankets)?: Unable Difficulty moving from lying on back to sitting on  the side of the bed? : Unable Difficulty sitting down on and standing up from a chair with arms (e.g., wheelchair, bedside commode, etc,.)?: Unable Help needed moving to and from a bed to chair (including a wheelchair)?: Total Help needed walking in hospital room?: Total Help needed climbing 3-5 steps with a railing? : Total 6 Click Score: 6    End of Session Equipment Utilized During Treatment: Gait belt Activity Tolerance: Patient tolerated treatment well Patient left: with call bell/phone within reach;in chair;with chair alarm set   PT Visit Diagnosis: Unsteadiness on feet (R26.81);Other abnormalities of gait and mobility (R26.89);History of falling (Z91.81);Muscle weakness (generalized) (M62.81);Repeated falls (R29.6)     Time: 1610-9604 PT Time Calculation (min) (ACUTE ONLY): 24 min  Charges:  $Therapeutic Exercise: 8-22 mins $Therapeutic Activity: 8-22 mins                    G CodesSheran Lawless, [place] [phone] 03/17/2018    Elray Mcgregor 03/17/2018, 11:04 AM

## 2018-03-17 NOTE — Progress Notes (Signed)
PROGRESS NOTE    DYSON Tyler Gray  WJX:[id number] DOB: Jun 20, 1933 DOA: 03/08/2018 PCP: Renaye Rakers, MD    Brief Narrative:  84 year old male who presented with weakness and fatigue. He does have a significant past medical history for nonischemic cardiomyopathy, paroxysmal atrial fibrillation, chronic systolic heart failure status post AICD, dyslipidemia chronic kidney disease stage III. Patient was found by nursing confused and weak,along with dyspnea and lower extremity edema. On his initial physical examination blood pressure 104/62, heart rate 61, temperature 97.8, respiratory rate 16,oxygen saturation 100%. He has dry mucous membranes, lungs are clear to auscultation bilaterally,heart S1-S2 present and rhythmic, the abdomen was soft nontender, positive lower extremity edema 2+ up to the sacrum.Sodium 142, potassium 4.2, chloride 107, bicarb 21,was 116, BUN 74, creatinine 3.30,white count 14.0, hemoglobin 8.5 hematocrit 26.5, platelets 105, INR 2.1.Urinalysis specific gravity 1.009, white cells greater than 50 RBCs 11-20.  Patient was admitted to the hospital with working diagnosis of decompensated systolic heart failure, complicated by acute kidney injury and hyperkalemia   Assessment & Plan:   Active Problems:   Paroxysmal atrial fibrillation (HCC)   CHRONIC SYSTOLIC HEART FAILURE   Chronic kidney disease, stage IV (severe) (HCC)   Automatic implantable cardioverter-defibrillator in situ   Goals of care, counseling/discussion   AKI (acute kidney injury) (HCC)   Acute lower UTI   Acute systolic CHF (congestive heart failure) (HCC)   Elevated INR   Hyperkalemia   Elevated troponin   Malnutrition of moderate degree   Pressure injury of skin   Palliative care by specialist  1. Acute on chronic systolic heart failure. Urine output over last 24 hours up to 1,500 cc,  heart failure management with amiodarone. Holding on ace inh due to aki. Patient continue to have edema in  his lower extremities, possible intravascular volume depletion, will hold on furosemide for now due to worsening renal function.   2. AKI on CKD stage 3. Worsening renal function with a serum cr up to 4 today, will hold on further diuresis due to intravascular volume depletion. Will continue to follow renal panel in am, avoid hypotension and nephrotoxic medications.   3. Chronic atrial fibrillation. Continue amiodarone for rate control, for anticoagulation warfarin INR has remained therapeutic at 2.3, continue to follow pharmacy protocol.   4. Urine tract infection. Resolved.    DVT prophylaxis:warfarin Code Status:full Family Communication:no family at the bedside today.   Disposition Plan:Pending improvement of renal function before transfer to SNF.    Consultants:  Cardiology   Nephrology  Procedures:    Antimicrobials:       Subjective: Patient feeling well this am, no nausea or vomiting, dyspnea is at baseline, positive lower extremity edema.   Objective: Vitals:   03/16/18 0501 03/16/18 1238 03/16/18 1929 03/17/18 0455  BP: 93/63 (!) 97/59 98/64 103/73  Pulse: 69 67 (!) 58 71  Resp: 18 16 16 18   Temp: 99.2 F (37.3 C) 98.5 F (36.9 C) 98.6 F (37 C) 99.5 F (37.5 C)  TempSrc: Oral Oral Oral Oral  SpO2: 99% 100% 100% 100%  Weight: 76.2 kg (168 lb)   73.9 kg (163 lb)  Height:        Intake/Output Summary (Last 24 hours) at 03/17/2018 0804 Last data filed at 03/17/2018 0130 Gross per 24 hour  Intake 917 ml  Output 700 ml  Net 217 ml   Filed Weights   03/15/18 0400 03/16/18 0501 03/17/18 0455  Weight: 76.7 kg (169 lb) 76.2 kg (168 lb) 73.9  kg (163 lb)    Examination:   General: Not in pain or dyspnea, deconditioned  Neurology: Awake and alert, non focal  E ENT: mild pallor, no icterus, oral mucosa moist Cardiovascular: No JVD. S1-S2 present, rhythmic, no gallops, rubs, or murmurs. ++ pitting lower extremity edema. Pulmonary:  positive breath sounds bilaterally, decreased air movement at bases, no wheezing, rhonchi or rales. Gastrointestinal. Abdomen with no organomegaly, non tender, no rebound or guarding Skin. No rashes Musculoskeletal: no joint deformities     Data Reviewed: I have personally reviewed following labs and imaging studies  CBC: Recent Labs  Lab 03/14/18 0507  WBC 14.0*  NEUTROABS 11.5*  HGB 8.5*  HCT 26.5*  MCV 99.6  PLT 105*   Basic Metabolic Panel: Recent Labs  Lab 03/11/18 0833 03/12/18 0637 03/13/18 0547 03/14/18 0507 03/15/18 0616 03/16/18 0617 03/17/18 0527  NA 141 141 142 140 140 141 141  K 4.9 4.3 4.2 4.1 3.9 4.0 4.3  CL 113* 112* 107 109 106 104 105  CO2 16* 20* 21* 23 23 24 25   GLUCOSE 106* 106* 116* 121* 135* 132* 122*  BUN 65* 68* 74* 82* 97* 110* 119*  CREATININE 3.36* 3.42* 3.30* 3.41* 3.54* 3.72* 4.06*  CALCIUM 8.3* 8.2* 8.3* 7.7* 7.7* 7.8* 7.8*  PHOS 3.9 4.1  --   --   --   --   --    GFR: Estimated Creatinine Clearance: 13.3 mL/min (A) (by C-G formula based on SCr of 4.06 mg/dL (H)). Liver Function Tests: Recent Labs  Lab 03/11/18 0833 03/12/18 0637  ALBUMIN 2.5* 2.4*   No results for input(s): LIPASE, AMYLASE in the last 168 hours. No results for input(s): AMMONIA in the last 168 hours. Coagulation Profile: Recent Labs  Lab 03/13/18 0547 03/14/18 0507 03/15/18 0616 03/16/18 0617 03/17/18 0527  INR 2.12 2.11 2.13 2.18 2.32   Cardiac Enzymes: No results for input(s): CKTOTAL, CKMB, CKMBINDEX, TROPONINI in the last 168 hours. BNP (last 3 results) Recent Labs    10/14/17 1209  PROBNP 15,115*   HbA1C: No results for input(s): HGBA1C in the last 72 hours. CBG: No results for input(s): GLUCAP in the last 168 hours. Lipid Profile: No results for input(s): CHOL, HDL, LDLCALC, TRIG, CHOLHDL, LDLDIRECT in the last 72 hours. Thyroid Function Tests: No results for input(s): TSH, T4TOTAL, FREET4, T3FREE, THYROIDAB in the last 72  hours. Anemia Panel: No results for input(s): VITAMINB12, FOLATE, FERRITIN, TIBC, IRON, RETICCTPCT in the last 72 hours.    Radiology Studies: I have reviewed all of the imaging during this hospital visit personally     Scheduled Meds: . amiodarone  400 mg Oral Daily  . atorvastatin  40 mg Oral q1800  . feeding supplement (ENSURE ENLIVE)  237 mL Oral TID BM  . multivitamin with minerals  1 tablet Oral Daily  . Warfarin - Pharmacist Dosing Inpatient   Does not apply q1800   Continuous Infusions:   LOS: 9 days        Mauricio Annett Gula, MD [facility] Pager [phone]

## 2018-03-17 NOTE — Progress Notes (Signed)
Palliative:  I met today and spoke with both Tyler Gray and Tyler Gray. I explained the difficult balance of Tyler heart failure with renal dysfunction and diuretics. Explained that Tyler heart function is why he is so weak and fatigued and poor appetite. I tried to stress to them the severity of this condition and Tyler poor prognosis. Tyler Gray is very resistant and tells me "I believe I will get better." He believes that it may take him a couple weeks but he believes that he will be "back to normal." I also attempted to encourage him to consider thinking and talking with Tyler family about Tyler wishes regarding resuscitation and life support. I explained that it is important for everyone to talk about these decisions even before we get sick. He refuses to discuss and continues to reiterate that he knows he will be fine and get better. Emotional support provided.   I spoke more with Tyler Gray privately and she shares that she and the children believe that he should be DNR but he does not want that. She does not want to see him suffer and tells me that she knows he is not getting any better. I tried to support her and share that some patients cannot allow them to think about anything other than improvement. I also shared that it may be up to her to make these decisions on Tyler behalf at some point. For now he is clear on Tyler desires but in the event of further decline I believe Gray would opt for DNR and comfort care per our conversation today. Emotional support provided.   25 min  Tyler Sill, NP Palliative Medicine Team Pager # [phone] (M-F 8a-5p) Team Phone # [phone] (Nights/Weekends)

## 2018-03-17 NOTE — Progress Notes (Signed)
Rounding with MD patient up in chair with no complaints, legs elevated. Lasix on hold. plan to watch kidney function lab one more day in acute care the to Rehab per MD .

## 2018-03-18 LAB — PROTIME-INR
INR: 2.64
Prothrombin Time: 28 seconds — ABNORMAL HIGH (ref 11.4–15.2)

## 2018-03-18 LAB — BASIC METABOLIC PANEL
ANION GAP: 13 (ref 5–15)
BUN: 132 mg/dL — AB (ref 8–23)
CHLORIDE: 103 mmol/L (ref 98–111)
CO2: 24 mmol/L (ref 22–32)
Calcium: 7.9 mg/dL — ABNORMAL LOW (ref 8.9–10.3)
Creatinine, Ser: 4.41 mg/dL — ABNORMAL HIGH (ref 0.61–1.24)
GFR calc non Af Amer: 11 mL/min — ABNORMAL LOW (ref >60)
GFR, EST AFRICAN AMERICAN: 13 mL/min — AB (ref >60)
Glucose, Bld: 118 mg/dL — ABNORMAL HIGH (ref 70–99)
POTASSIUM: 4.2 mmol/L (ref 3.5–5.1)
SODIUM: 140 mmol/L (ref 135–145)

## 2018-03-18 MED ORDER — WARFARIN SODIUM 3 MG PO TABS
3.0000 mg | ORAL_TABLET | ORAL | Status: DC
  Administered 2018-03-18: 3 mg via ORAL
  Filled 2018-03-18: qty 1

## 2018-03-18 MED ORDER — WARFARIN SODIUM 2.5 MG PO TABS: 4.5000 mg | ORAL_TABLET | ORAL | Status: DC

## 2018-03-18 NOTE — Progress Notes (Signed)
PROGRESS NOTE    Tyler Gray  YOV:[id number] DOB: 1933/03/06 DOA: 03/08/2018 PCP: Renaye Rakers, MD    Brief Narrative:  84 year old male who presented with weakness and fatigue. He does have a significant past medical history for nonischemic cardiomyopathy, paroxysmal atrial fibrillation, chronic systolic heart failure status post AICD, dyslipidemia chronic kidney disease stage III. Patient was found by nursing confused and weak,along with dyspnea and lower extremity edema. On his initial physical examination blood pressure 104/62, heart rate 61, temperature 97.8, respiratory rate 16,oxygen saturation 100%. He has dry mucous membranes, lungs are clear to auscultation bilaterally,heart S1-S2 present and rhythmic, the abdomen was soft nontender, positive lower extremity edema 2+ up to the sacrum.Sodium 142, potassium 4.2, chloride 107, bicarb 21,was 116, BUN 74, creatinine 3.30,white count 14.0, hemoglobin 8.5 hematocrit 26.5, platelets 105, INR 2.1.Urinalysis specific gravity 1.009, white cells greater than 50 RBCs 11-20.  Patient was admitted to the hospital with working diagnosis of decompensated systolic heart failure, complicated by acute kidney injury and hyperkalemia  Assessment & Plan:   Active Problems:   Paroxysmal atrial fibrillation (HCC)   CHRONIC SYSTOLIC HEART FAILURE   Chronic kidney disease, stage IV (severe) (HCC)   Automatic implantable cardioverter-defibrillator in situ   Goals of care, counseling/discussion   AKI (acute kidney injury) (HCC)   Acute lower UTI   Acute systolic CHF (congestive heart failure) (HCC)   Elevated INR   Hyperkalemia   Elevated troponin   Malnutrition of moderate degree   Pressure injury of skin   Palliative care by specialist   1. Acute on chronic systolic heart failure.Urine output over last 24 hours up to 700 cc, continue heart failure management with amiodarone. No ace inh du to risk of worsening renal function.  2.  AKI on CKD stage 3. Continue worsening renal function with a serum cr up to 4.41 today with K at 4,2 and serum bicarbonate at 24, holding further diuresis, will follow on renal panel in am, avoid hypotension or nephrotoxic medications.  3. Chronic atrial fibrillation.On amiodarone for rate control,  anticoagulation with warfarin INR, 2,6  continue to follow pharmacy protocol.    DVT prophylaxis:warfarin Code Status:full Family Communication:no family at the bedside today.  Disposition Plan:Pending improvement of renal function before transfer to SNF.   Consultants:  Cardiology   Nephrology  Procedures:    Antimicrobials:       Subjective: Patient with no dyspnea, improved lower extremity edema, no nausea or vomiting. Positive weakness and deconditioning.   Objective: Vitals:   03/17/18 1208 03/17/18 2054 03/18/18 0541 03/18/18 1215  BP: 102/70 102/73 108/68 103/72  Pulse: 67 67 84 65  Resp: 18 18 18 20   Temp: 98.3 F (36.8 C) (!) 97.5 F (36.4 C) 98.6 F (37 C) 98.4 F (36.9 C)  TempSrc: Oral Oral Oral Oral  SpO2: 100% 100% 99% 99%  Weight:   74.8 kg (165 lb)   Height:        Intake/Output Summary (Last 24 hours) at 03/18/2018 1228 Last data filed at 03/18/2018 0900 Gross per 24 hour  Intake 420 ml  Output 700 ml  Net -280 ml   Filed Weights   03/16/18 0501 03/17/18 0455 03/18/18 0541  Weight: 76.2 kg (168 lb) 73.9 kg (163 lb) 74.8 kg (165 lb)    Examination:   General: deconditioned  Neurology: Awake and alert, non focal  E ENT: mild pallor, no icterus, oral mucosa dry Cardiovascular: No JVD. S1-S2 present, rhythmic, no gallops, rubs, or murmurs.Trace  lower extremity edema. Pulmonary: decreased breath sounds bilaterally at bases, adequate air movement, no wheezing, rhonchi or rales. Gastrointestinal. Abdomen with no organomegaly, non tender, no rebound or guarding Skin. No rashes Musculoskeletal: no joint  deformities     Data Reviewed: I have personally reviewed following labs and imaging studies  CBC: Recent Labs  Lab 03/14/18 0507  WBC 14.0*  NEUTROABS 11.5*  HGB 8.5*  HCT 26.5*  MCV 99.6  PLT 105*   Basic Metabolic Panel: Recent Labs  Lab 03/12/18 0637  03/14/18 0507 03/15/18 0616 03/16/18 0617 03/17/18 0527 03/18/18 0520  NA 141   < > 140 140 141 141 140  K 4.3   < > 4.1 3.9 4.0 4.3 4.2  CL 112*   < > 109 106 104 105 103  CO2 20*   < > 23 23 24 25 24   GLUCOSE 106*   < > 121* 135* 132* 122* 118*  BUN 68*   < > 82* 97* 110* 119* 132*  CREATININE 3.42*   < > 3.41* 3.54* 3.72* 4.06* 4.41*  CALCIUM 8.2*   < > 7.7* 7.7* 7.8* 7.8* 7.9*  PHOS 4.1  --   --   --   --   --   --    < > = values in this interval not displayed.   GFR: Estimated Creatinine Clearance: 12.3 mL/min (A) (by C-G formula based on SCr of 4.41 mg/dL (H)). Liver Function Tests: Recent Labs  Lab 03/12/18 0637  ALBUMIN 2.4*   No results for input(s): LIPASE, AMYLASE in the last 168 hours. No results for input(s): AMMONIA in the last 168 hours. Coagulation Profile: Recent Labs  Lab 03/14/18 0507 03/15/18 0616 03/16/18 0617 03/17/18 0527 03/18/18 0520  INR 2.11 2.13 2.18 2.32 2.64   Cardiac Enzymes: No results for input(s): CKTOTAL, CKMB, CKMBINDEX, TROPONINI in the last 168 hours. BNP (last 3 results) Recent Labs    10/14/17 1209  PROBNP 15,115*   HbA1C: No results for input(s): HGBA1C in the last 72 hours. CBG: No results for input(s): GLUCAP in the last 168 hours. Lipid Profile: No results for input(s): CHOL, HDL, LDLCALC, TRIG, CHOLHDL, LDLDIRECT in the last 72 hours. Thyroid Function Tests: No results for input(s): TSH, T4TOTAL, FREET4, T3FREE, THYROIDAB in the last 72 hours. Anemia Panel: No results for input(s): VITAMINB12, FOLATE, FERRITIN, TIBC, IRON, RETICCTPCT in the last 72 hours.    Radiology Studies: I have reviewed all of the imaging during this hospital visit  personally     Scheduled Meds: . amiodarone  400 mg Oral Daily  . atorvastatin  40 mg Oral q1800  . feeding supplement (ENSURE ENLIVE)  237 mL Oral TID BM  . multivitamin with minerals  1 tablet Oral Daily  . warfarin  3 mg Oral Q48H   And  . [START ON 03/19/2018] warfarin  4.5 mg Oral Q48H  . Warfarin - Pharmacist Dosing Inpatient   Does not apply q1800   Continuous Infusions:   LOS: 10 days        Mauricio Annett Gula, MD [facility] Pager [phone]

## 2018-03-18 NOTE — Progress Notes (Signed)
ANTICOAGULATION CONSULT NOTE   Pharmacy Consult for Warfarin Indication: atrial fibrillation  No Known Allergies  Labs: Recent Labs    03/16/18 0617 03/17/18 0527 03/18/18 0520  LABPROT 24.1* 25.3* 28.0*  INR 2.18 2.32 2.64  CREATININE 3.72* 4.06* 4.41*   Assessment: 84 yo male admitted for generalized weakness. On Warfarin PTA for Atrial fibrillation.  On admission, INR was supratherapeutic at 3.6. Warfarin held for the last few days until INR came down to the therapeutic range, and Warfarin resumed.    INR Today: 2.64, no updated CBC today, no overt bleeding noted  Home Warfarin regimen:  3 mg alternating with 4.5 mg every other day.  Meds Reviewed: Interaction with amiodarone (started on 6/30)  Goal of Therapy:  INR 2-3   Plan:  Restart Warfarin home dose of 3 mg alternating with 4.5 mg daily Daily INR Monitor for s/sx of bleeding  Lenord Carbo, PharmD Pharmacy Resident Clinical phone for 03/18/2018 until 3p is [phone]

## 2018-03-19 LAB — BASIC METABOLIC PANEL
ANION GAP: 14 (ref 5–15)
BUN: 142 mg/dL — ABNORMAL HIGH (ref 8–23)
CO2: 23 mmol/L (ref 22–32)
Calcium: 8 mg/dL — ABNORMAL LOW (ref 8.9–10.3)
Chloride: 105 mmol/L (ref 98–111)
Creatinine, Ser: 4.5 mg/dL — ABNORMAL HIGH (ref 0.61–1.24)
GFR calc Af Amer: 13 mL/min — ABNORMAL LOW (ref >60)
GFR, EST NON AFRICAN AMERICAN: 11 mL/min — AB (ref >60)
Glucose, Bld: 117 mg/dL — ABNORMAL HIGH (ref 70–99)
POTASSIUM: 4.1 mmol/L (ref 3.5–5.1)
SODIUM: 142 mmol/L (ref 135–145)

## 2018-03-19 LAB — PROTIME-INR
INR: 2.76
Prothrombin Time: 29 seconds — ABNORMAL HIGH (ref 11.4–15.2)

## 2018-03-19 LAB — GLUCOSE, CAPILLARY: Glucose-Capillary: 105 mg/dL — ABNORMAL HIGH (ref 70–99)

## 2018-03-19 MED ORDER — LOPERAMIDE HCL 2 MG PO CAPS
2.0000 mg | ORAL_CAPSULE | Freq: Four times a day (QID) | ORAL | Status: DC | PRN
  Administered 2018-03-19: 2 mg via ORAL
  Filled 2018-03-19 (×2): qty 1

## 2018-03-19 MED ORDER — WARFARIN SODIUM 3 MG PO TABS
3.0000 mg | ORAL_TABLET | Freq: Once | ORAL | Status: AC
  Administered 2018-03-19: 3 mg via ORAL
  Filled 2018-03-19: qty 1

## 2018-03-19 MED ORDER — FUROSEMIDE 10 MG/ML IJ SOLN
80.0000 mg | Freq: Once | INTRAMUSCULAR | Status: AC
Start: 2018-03-19 — End: 2018-03-19
  Administered 2018-03-19: 80 mg via INTRAVENOUS
  Filled 2018-03-19: qty 8

## 2018-03-19 NOTE — Progress Notes (Signed)
ANTICOAGULATION CONSULT NOTE   Pharmacy Consult for Warfarin Indication: atrial fibrillation  No Known Allergies  Labs: Recent Labs    03/17/18 0527 03/18/18 0520 03/19/18 0656  LABPROT 25.3* 28.0* 29.0*  INR 2.32 2.64 2.76  CREATININE 4.06* 4.41*  --    Assessment: 84 yo male admitted for generalized weakness. On Warfarin PTA for Atrial fibrillation.  On admission, INR was supratherapeutic at 3.6. Warfarin held for the last few days until INR came down to the therapeutic range, and Warfarin resumed.    INR Today: 2.76, no updated CBC today, no overt bleeding noted  Home Warfarin regimen:  3 mg alternating with 4.5 mg every other day.  Meds Reviewed: Interaction with amiodarone (started on 6/30)  Goal of Therapy:  INR 2-3   Plan:  Warfarin 3 mg PO tonight Daily INR Monitor for s/sx of bleeding  Lenord Carbo, PharmD Pharmacy Resident Clinical phone for 03/19/2018 until 1500 is [phone]

## 2018-03-19 NOTE — Progress Notes (Signed)
PROGRESS NOTE    Tyler Gray  ZOX:[id number] DOB: Aug 22, 1933 DOA: 03/08/2018 PCP: Renaye Rakers, MD    Brief Narrative:  84 year old male who presented with weakness and fatigue. He does have a significant past medical history for nonischemic cardiomyopathy, paroxysmal atrial fibrillation, chronic systolic heart failure status post AICD, dyslipidemia chronic kidney disease stage III. Patient was found by nursing confused and weak,along with dyspnea and lower extremity edema. On his initial physical examination blood pressure 104/62, heart rate 61, temperature 97.8, respiratory rate 16,oxygen saturation 100%. He has dry mucous membranes, lungs are clear to auscultation bilaterally,heart S1-S2 present and rhythmic, the abdomen was soft nontender, positive lower extremity edema 2+ up to the sacrum.Sodium 142, potassium 4.2, chloride 107, bicarb 21,was 116, BUN 74, creatinine 3.30,white count 14.0, hemoglobin 8.5 hematocrit 26.5, platelets 105, INR 2.1.Urinalysis specific gravity 1.009, white cells greater than 50 RBCs 11-20.  Patient was admitted to the hospital with working diagnosis of decompensated systolic heart failure, complicated by acute kidney injury and hyperkalemia   Assessment & Plan:   Active Problems:   Paroxysmal atrial fibrillation (HCC)   CHRONIC SYSTOLIC HEART FAILURE   Chronic kidney disease, stage IV (severe) (HCC)   Automatic implantable cardioverter-defibrillator in situ   Goals of care, counseling/discussion   AKI (acute kidney injury) (HCC)   Acute lower UTI   Acute systolic CHF (congestive heart failure) (HCC)   Elevated INR   Hyperkalemia   Elevated troponin   Malnutrition of moderate degree   Pressure injury of skin   Palliative care by specialist  1. Acute on chronic systolic heart failure.Veru low urine output over last 24 hours up to 500cc, with worsening dependent edema, will continueheart failure management with amiodarone. Continue to  hold on ace inh due to risk of worsening renal function.  2. AKI on CKD stage 3. Persistent worsening renal function with a serum cr up to 4.50, with K at 4,1 and serum bicarbonate at 23, noted worsening edema, will due a trial of IV furosemide and follow response.   3. Chronic atrial fibrillation.Continue with amiodarone for rate control,and anticoagulation with warfarin INR 2,7 today, follow pharmacy protocol.  4. New moderate diarrhea. Suspected due to bowel edema related to volume overload, will do trial of imodium. Hold on c diff testing for now. Note that patient did received IV ceftriaxone on this admission.    DVT prophylaxis:warfarin Code Status:full Family Communication:no family at the bedside today.  Disposition Plan:Pending improvement of renal function before transfer to SNF.   Consultants:  Cardiology   Nephrology  Procedures:    Antimicrobials:     Subjective: Not feeling well this am, no nausea or vomiting, no dyspnea or chest pain. Very weak and deconditioned.   Objective: Vitals:   03/18/18 0541 03/18/18 1215 03/19/18 0416 03/19/18 0419  BP: 108/68 103/72 104/64   Pulse: 84 65 69   Resp: 18 20 18    Temp: 98.6 F (37 C) 98.4 F (36.9 C) 98.6 F (37 C)   TempSrc: Oral Oral Oral   SpO2: 99% 99% 100%   Weight: 74.8 kg (165 lb)   73.5 kg (162 lb)  Height:        Intake/Output Summary (Last 24 hours) at 03/19/2018 1049 Last data filed at 03/19/2018 0900 Gross per 24 hour  Intake 440 ml  Output 500 ml  Net -60 ml   Filed Weights   03/17/18 0455 03/18/18 0541 03/19/18 0419  Weight: 73.9 kg (163 lb) 74.8 kg (165 lb) 73.5  kg (162 lb)    Examination:   General: Not in pain or dyspnea, deconditioned  Neurology: Awake and alert, non focal  E ENT: moderate pallor, no icterus, oral mucosa moist Cardiovascular: No JVD. S1-S2 present, rhythmic, no gallops, rubs, or murmurs. +++ pitting lower extremity edema, predominantly  at the dependent zones.   Pulmonary: decrased breath sounds bilaterally at bases, adequate air movement, no wheezing, rhonchi or rales. Gastrointestinal. Abdomen with no organomegaly, non tender, no rebound or guarding Skin. No rashes Musculoskeletal: no joint deformities     Data Reviewed: I have personally reviewed following labs and imaging studies  CBC: Recent Labs  Lab 03/14/18 0507  WBC 14.0*  NEUTROABS 11.5*  HGB 8.5*  HCT 26.5*  MCV 99.6  PLT 105*   Basic Metabolic Panel: Recent Labs  Lab 03/15/18 0616 03/16/18 0617 03/17/18 0527 03/18/18 0520 03/19/18 0656  NA 140 141 141 140 142  K 3.9 4.0 4.3 4.2 4.1  CL 106 104 105 103 105  CO2 23 24 25 24 23   GLUCOSE 135* 132* 122* 118* 117*  BUN 97* 110* 119* 132* 142*  CREATININE 3.54* 3.72* 4.06* 4.41* 4.50*  CALCIUM 7.7* 7.8* 7.8* 7.9* 8.0*   GFR: Estimated Creatinine Clearance: 12 mL/min (A) (by C-G formula based on SCr of 4.5 mg/dL (H)). Liver Function Tests: No results for input(s): AST, ALT, ALKPHOS, BILITOT, PROT, ALBUMIN in the last 168 hours. No results for input(s): LIPASE, AMYLASE in the last 168 hours. No results for input(s): AMMONIA in the last 168 hours. Coagulation Profile: Recent Labs  Lab 03/15/18 0616 03/16/18 0617 03/17/18 0527 03/18/18 0520 03/19/18 0656  INR 2.13 2.18 2.32 2.64 2.76   Cardiac Enzymes: No results for input(s): CKTOTAL, CKMB, CKMBINDEX, TROPONINI in the last 168 hours. BNP (last 3 results) Recent Labs    10/14/17 1209  PROBNP 15,115*   HbA1C: No results for input(s): HGBA1C in the last 72 hours. CBG: Recent Labs  Lab 03/19/18 0751  GLUCAP 105*   Lipid Profile: No results for input(s): CHOL, HDL, LDLCALC, TRIG, CHOLHDL, LDLDIRECT in the last 72 hours. Thyroid Function Tests: No results for input(s): TSH, T4TOTAL, FREET4, T3FREE, THYROIDAB in the last 72 hours. Anemia Panel: No results for input(s): VITAMINB12, FOLATE, FERRITIN, TIBC, IRON, RETICCTPCT in the  last 72 hours.    Radiology Studies: I have reviewed all of the imaging during this hospital visit personally     Scheduled Meds: . amiodarone  400 mg Oral Daily  . atorvastatin  40 mg Oral q1800  . feeding supplement (ENSURE ENLIVE)  237 mL Oral TID BM  . furosemide  80 mg Intravenous Once  . multivitamin with minerals  1 tablet Oral Daily  . warfarin  3 mg Oral ONCE-1800  . Warfarin - Pharmacist Dosing Inpatient   Does not apply q1800   Continuous Infusions:   LOS: 11 days        Mauricio Annett Gula, MD [facility] Pager [phone]

## 2018-03-20 LAB — BASIC METABOLIC PANEL
Anion gap: 10 (ref 5–15)
BUN: 147 mg/dL — ABNORMAL HIGH (ref 8–23)
CALCIUM: 7.7 mg/dL — AB (ref 8.9–10.3)
CO2: 24 mmol/L (ref 22–32)
Chloride: 107 mmol/L (ref 98–111)
Creatinine, Ser: 4.87 mg/dL — ABNORMAL HIGH (ref 0.61–1.24)
GFR calc non Af Amer: 10 mL/min — ABNORMAL LOW (ref >60)
GFR, EST AFRICAN AMERICAN: 12 mL/min — AB (ref >60)
Glucose, Bld: 118 mg/dL — ABNORMAL HIGH (ref 70–99)
Potassium: 3.9 mmol/L (ref 3.5–5.1)
SODIUM: 141 mmol/L (ref 135–145)

## 2018-03-20 LAB — PROTIME-INR
INR: 2.97
PROTHROMBIN TIME: 30.6 s — AB (ref 11.4–15.2)

## 2018-03-20 MED ORDER — FUROSEMIDE 10 MG/ML IJ SOLN
80.0000 mg | Freq: Once | INTRAMUSCULAR | Status: AC
  Administered 2018-03-20: 80 mg via INTRAVENOUS
  Filled 2018-03-20: qty 8

## 2018-03-20 MED ORDER — WARFARIN SODIUM 1 MG PO TABS
1.5000 mg | ORAL_TABLET | Freq: Once | ORAL | Status: AC
  Administered 2018-03-20: 1.5 mg via ORAL
  Filled 2018-03-20: qty 1

## 2018-03-20 NOTE — Progress Notes (Addendum)
ANTICOAGULATION CONSULT NOTE   Pharmacy Consult for Warfarin Indication: atrial fibrillation  No Known Allergies  Labs: Recent Labs    03/18/18 0520 03/19/18 0656 03/20/18 0459  LABPROT 28.0* 29.0* 30.6*  INR 2.64 2.76 2.97  CREATININE 4.41* 4.50* 4.87*   Assessment: 84 yo male admitted for generalized weakness. On Warfarin PTA for Atrial fibrillation.  On admission, INR was supratherapeutic at 3.6. Warfarin held for several days on admission until INR came down to the therapeutic range, and Warfarin resumed 6/30.  Noted Scr rise  INR Today: trended up 2.97, no updated CBC today, no overt bleeding noted  Home Warfarin regimen:  3 mg alternating with 4.5 mg every other day.  Meds Reviewed: Interaction with amiodarone (started on 6/30)  Goal of Therapy:  INR 2-3   Plan:  Warfarin 1.5 mg PO tonight - likely needs lower dose d/t amio DDI  Daily INR Monitor for s/sx of bleeding  Tyler Gray, PharmD PGY1 Pharmacy Resident Phone [phone] 03/20/2018  9:46 AM

## 2018-03-20 NOTE — Progress Notes (Addendum)
PROGRESS NOTE    Tyler Gray  [id number]:[id number] DOB: 1932/09/15 DOA: 03/08/2018 PCP: Renaye Rakers, MD    Brief Narrative:  84 year old male who presented with weakness and fatigue. He does have a significant past medical history for nonischemic cardiomyopathy, paroxysmal atrial fibrillation, chronic systolic heart failure status post AICD, dyslipidemia chronic kidney disease stage III. Patient was found by nursing confused and weak,along with dyspnea and lower extremity edema. On his initial physical examination blood pressure 104/62, heart rate 61, temperature 97.8, respiratory rate 16,oxygen saturation 100%. He has dry mucous membranes, lungs are clear to auscultation bilaterally,heart S1-S2 present and rhythmic, the abdomen was soft nontender, positive lower extremity edema 2+ up to the sacrum.Sodium 142, potassium 4.2, chloride 107, bicarb 21,was 116, BUN 74, creatinine 3.30,white count 14.0, hemoglobin 8.5 hematocrit 26.5, platelets 105, INR 2.1.Urinalysis specific gravity 1.009, white cells greater than 50 RBCs 11-20.  Patient was admitted to the hospital with working diagnosis of decompensated systolic heart failure, complicated by acute kidney injury and hyperkalemia   Assessment & Plan:   Active Problems:   Paroxysmal atrial fibrillation (HCC)   CHRONIC SYSTOLIC HEART FAILURE   Chronic kidney disease, stage IV (severe) (HCC)   Automatic implantable cardioverter-defibrillator in situ   Goals of care, counseling/discussion   AKI (acute kidney injury) (HCC)   Acute lower UTI   Acute systolic CHF (congestive heart failure) (HCC)   Elevated INR   Hyperkalemia   Elevated troponin   Malnutrition of moderate degree   Pressure injury of skin   Palliative care by specialist   1. Acute on chronic systolic heart failure. Holding on ace inh due to risk of worsening renal function. No b blocker due to risk of bradycardia. Positive edema at the lower extremities, had 80 mg  IV furosemide yesterday  2. AKI on CKD stage 3.Patient with progressive worsening renal function serum cr today at 4,87, with K at 3,9 and serum bicarbonate at 24.  Had furosemide yesterday with urine output over last 24 hours up to 775 ml, will repeat dose of furosemide today and continue to follow urine output. Patient has been seen by nephrology on this admission, found not a good candidate for renal replacement therapy. On exam with persistent peripheral edema.   3. Chronic atrial fibrillation.On amiodarone for rate control,onwarfarin for anticoagulation INR 2,9. Continue dosing per pharmacy protocol.   4. New moderate diarrhea. Not worsening, will continue imodium as needed, no indication for c diff testing today.   DVT prophylaxis:warfarin Code Status:full Family Communication:no family at the bedside today.  Disposition Plan:Pending improvement of renal function before transfer to SNF.   Consultants:  Cardiology   Nephrology  Procedures:    Antimicrobials:     Subjective: Patient feeling fatigue, no dyspnea or chest pain, no nausea or vomiting, persistent mild to moderate diarrhea.   Objective: Vitals:   03/19/18 1200 03/19/18 2300 03/20/18 0451 03/20/18 1125  BP: 94/63 109/73 108/67 101/70  Pulse: 66 67 67 63  Resp: 18 18 18 16   Temp: 98.6 F (37 C) 98.2 F (36.8 C) (!) 97.3 F (36.3 C) 98.6 F (37 C)  TempSrc: Oral Oral Oral Oral  SpO2: 100% 100% 99% 100%  Weight:   73.9 kg (163 lb)   Height:        Intake/Output Summary (Last 24 hours) at 03/20/2018 1244 Last data filed at 03/20/2018 0900 Gross per 24 hour  Intake 460 ml  Output 775 ml  Net -315 ml   [employer]  03/18/18 0541 03/19/18 0419 03/20/18 0451  Weight: 74.8 kg (165 lb) 73.5 kg (162 lb) 73.9 kg (163 lb)    Examination:   General: Not in pain or dyspnea, deconditioned  Neurology: Awake and alert, non focal  E ENT: positive pallor, no icterus, oral  mucosa moist Cardiovascular: No JVD. S1-S2 present, rhythmic, no gallops, rubs, or murmurs. ++ pitting edema at the dependent zones. Pulmonary: decreased breath sounds bilaterally at bases, decreases air movement, no wheezing, rhonchi or rales. Gastrointestinal. Abdomen with no organomegaly, non tender, no rebound or guarding Skin. No rashes Musculoskeletal: no joint deformities     Data Reviewed: I have personally reviewed following labs and imaging studies  CBC: Recent Labs  Lab 03/14/18 0507  WBC 14.0*  NEUTROABS 11.5*  HGB 8.5*  HCT 26.5*  MCV 99.6  PLT 105*   Basic Metabolic Panel: Recent Labs  Lab 03/16/18 0617 03/17/18 0527 03/18/18 0520 03/19/18 0656 03/20/18 0459  NA 141 141 140 142 141  K 4.0 4.3 4.2 4.1 3.9  CL 104 105 103 105 107  CO2 24 25 24 23 24   GLUCOSE 132* 122* 118* 117* 118*  BUN 110* 119* 132* 142* 147*  CREATININE 3.72* 4.06* 4.41* 4.50* 4.87*  CALCIUM 7.8* 7.8* 7.9* 8.0* 7.7*   GFR: Estimated Creatinine Clearance: 11.1 mL/min (A) (by C-G formula based on SCr of 4.87 mg/dL (H)). Liver Function Tests: No results for input(s): AST, ALT, ALKPHOS, BILITOT, PROT, ALBUMIN in the last 168 hours. No results for input(s): LIPASE, AMYLASE in the last 168 hours. No results for input(s): AMMONIA in the last 168 hours. Coagulation Profile: Recent Labs  Lab 03/16/18 0617 03/17/18 0527 03/18/18 0520 03/19/18 0656 03/20/18 0459  INR 2.18 2.32 2.64 2.76 2.97   Cardiac Enzymes: No results for input(s): CKTOTAL, CKMB, CKMBINDEX, TROPONINI in the last 168 hours. BNP (last 3 results) Recent Labs    10/14/17 1209  PROBNP 15,115*   HbA1C: No results for input(s): HGBA1C in the last 72 hours. CBG: Recent Labs  Lab 03/19/18 0751  GLUCAP 105*   Lipid Profile: No results for input(s): CHOL, HDL, LDLCALC, TRIG, CHOLHDL, LDLDIRECT in the last 72 hours. Thyroid Function Tests: No results for input(s): TSH, T4TOTAL, FREET4, T3FREE, THYROIDAB in the  last 72 hours. Anemia Panel: No results for input(s): VITAMINB12, FOLATE, FERRITIN, TIBC, IRON, RETICCTPCT in the last 72 hours.    Radiology Studies: I have reviewed all of the imaging during this hospital visit personally     Scheduled Meds: . amiodarone  400 mg Oral Daily  . atorvastatin  40 mg Oral q1800  . feeding supplement (ENSURE ENLIVE)  237 mL Oral TID BM  . multivitamin with minerals  1 tablet Oral Daily  . warfarin  1.5 mg Oral ONCE-1800  . Warfarin - Pharmacist Dosing Inpatient   Does not apply q1800   Continuous Infusions:   LOS: 12 days        Damira Kem Annett Gula, MD [facility] Pager [phone]

## 2018-03-20 NOTE — Progress Notes (Signed)
Palliative:  Tyler Gray continues with climbing creatinine which has limited use of diuretics for heart failure as well. He is extremely frail and weakened. I spoke with Tyler Gray about his renal function worsening and concern for this. He became discouraged and tells me he is "not ready to go." I did explain that his doctors are doing everything but it is not looking good. He changes the subject. He again speaks of hope for improvement.   Tyler Gray is still awake and alert and somewhat able to participate in decision making. His wife is not at bedside but our previous conversation was to prepare her for making difficult decisions for her husband. She understood that he would not improve but was hopeful that he would improve enough to attend rehab. I do not foresee that Ms. Gabor is likely to survive much longer. I anticipate that mental status with wane with worsening renal function in which case I would discuss further with wife transition to comfort care. Wife is inclined towards DNR as well.   Exam: No distress, "feeling good," on room air  25 min  Yong Channel, NP Palliative Medicine Team Pager # [phone] (M-F 8a-5p) Team Phone # [phone] (Nights/Weekends)

## 2018-03-20 NOTE — Clinical Social Work Note (Signed)
CSW continues to follow for discharge needs. Per MD, patient's kidney function continues to worsen. Will restart authorization once stable for discharge. Please notify CSW at least 24 hours prior to discharge so clinicals can be faxed and reviewed for authorization.  Charlynn Court, CSW [phone]

## 2018-03-21 LAB — BASIC METABOLIC PANEL
ANION GAP: 9 (ref 5–15)
BUN: 154 mg/dL — AB (ref 8–23)
CHLORIDE: 104 mmol/L (ref 98–111)
CO2: 28 mmol/L (ref 22–32)
Calcium: 7.7 mg/dL — ABNORMAL LOW (ref 8.9–10.3)
Creatinine, Ser: 5.03 mg/dL — ABNORMAL HIGH (ref 0.61–1.24)
GFR, EST AFRICAN AMERICAN: 11 mL/min — AB (ref >60)
GFR, EST NON AFRICAN AMERICAN: 10 mL/min — AB (ref >60)
GLUCOSE: 120 mg/dL — AB (ref 70–99)
POTASSIUM: 4 mmol/L (ref 3.5–5.1)
SODIUM: 141 mmol/L (ref 135–145)

## 2018-03-21 LAB — PROTIME-INR
INR: 3.66
Prothrombin Time: 36.1 seconds — ABNORMAL HIGH (ref 11.4–15.2)

## 2018-03-21 MED ORDER — MORPHINE SULFATE (PF) 2 MG/ML IV SOLN
2.0000 mg | Freq: Once | INTRAVENOUS | Status: AC
  Administered 2018-03-21: 2 mg via INTRAVENOUS
  Filled 2018-03-21: qty 1

## 2018-03-21 NOTE — Progress Notes (Signed)
Subjective: Interval History: has complaints weak, understands he close to the end.  Objective: Vital signs in last 24 hours: Temp:  [98 F (36.7 C)-98.7 F (37.1 C)] 98.7 F (37.1 C) (07/09 0544) Pulse Rate:  [65-68] 68 (07/09 0544) Resp:  [18-20] 18 (07/09 0544) BP: (101-105)/(61-67) 105/61 (07/09 0544) SpO2:  [96 %-98 %] 96 % (07/09 0544) Weight:  [73.5 kg (162 lb)] 73.5 kg (162 lb) (07/09 0544) Weight change: -0.454 kg (-1 lb)  Intake/Output from previous day: 07/08 0701 - 07/09 0700 In: 720 [P.O.:720] Out: 1600 [Urine:1600] Intake/Output this shift: Total I/O In: 480 [P.O.:480] Out: -   General appearance: cooperative, cachectic, no distress and pale Resp: diminished breath sounds bilaterally Cardio: S1, S2 normal and systolic murmur: systolic ejection 2/6, decrescendo at 2nd left intercostal space GI: soft, pos bs, nontender, liver down 5 cm, ? SP fullness Extremities: edema 2+  Lab Results: No results for input(s): WBC, HGB, HCT, PLT in the last 72 hours. BMET:  Recent Labs    03/20/18 0459 03/21/18 0609  NA 141 141  K 3.9 4.0  CL 107 104  CO2 24 28  GLUCOSE 118* 120*  BUN 147* 154*  CREATININE 4.87* 5.03*  CALCIUM 7.7* 7.7*   No results for input(s): PTH in the last 72 hours. Iron Studies: No results for input(s): IRON, TIBC, TRANSFERRIN, FERRITIN in the last 72 hours.  Studies/Results: No results found.  I have reviewed the patient's current medications.  Assessment/Plan: 1 CKD 4-5, now worse.  GFR 16 down to 11.  Vol xs .  Severe CM.  Will see if can allow vol to equilibrate even though has xs.  Cr ^ after diuresis. Also R/o component BOO 2 Chronic hydro 3 Anemia will eval 4 HPTH 5 CM severe 6 Pacer P stop Lasix, allow vol to equilibrate, place foley, check Fe, PTH   LOS: 13 days   Fayrene Fearing Yuridiana Formanek 03/21/2018,12:00 PM

## 2018-03-21 NOTE — Progress Notes (Signed)
Nutrition Follow-up  DOCUMENTATION CODES:   Non-severe (moderate) malnutrition in context of chronic illness  INTERVENTION:   -Continue Ensure Enlive po TID, each supplement provides 350 kcal and 20 grams of protein -Continue MVI with minerals daily  NUTRITION DIAGNOSIS:   Moderate Malnutrition related to chronic illness(CHF) as evidenced by energy intake < 75% for > or equal to 1 month, mild fat depletion, mild muscle depletion, moderate muscle depletion.  Ongoing  GOAL:   Patient will meet greater than or equal to 90% of their needs  Progressing  MONITOR:   PO intake, Supplement acceptance, Labs, Weight trends, Skin, I & O's  REASON FOR ASSESSMENT:   Malnutrition Screening Tool    ASSESSMENT:   ISIAC MACHIN is a 84 y.o. male with medical history significant for nonischemic cardiomyopathy, paroxysmal atrial fibrillation on warfarin, chronic systolic heart failure, AICD, hyperlipidemia, and chronic kidney disease stage III  Reviewed I/O's: +480 ml x 24 hours and -5.1 L since admission  Pt resting quietly in recliner at time of visit. Lunch tray was just delivered and meal tray untouched. Noted pt consumed approximately 1/3 of Ensure supplement. Intake has declined since last visit; meal completion 10-30%.   Reviewed palliative care and nephrology notes; pt continues to decline. Palliative care team continues to work with family regarding goals of care.   Labs reviewed: CBGS: 105.   Diet Order:   Diet Order           Diet 2 gram sodium Room service appropriate? Yes; Fluid consistency: Thin  Diet effective now          EDUCATION NEEDS:   Education needs have been addressed  Skin:  Skin Assessment: Skin Integrity Issues: Skin Integrity Issues:: Other (Comment) Other: burm to rt lateral leg from space heater  Last BM:  03/20/18  Height:   Ht Readings from Last 1 Encounters:  03/08/18 5\' 8"  (1.727 m)    Weight:   Wt Readings from Last 1 Encounters:   03/21/18 162 lb (73.5 kg)    Ideal Body Weight:  70 kg  BMI:  Body mass index is 24.63 kg/m.  Estimated Nutritional Needs:   Kcal:  1750-1950  Protein:  85-100 grams  Fluid:  1.7-1.9 L    Grier Czerwinski A. Mayford Knife, RD, LDN, CDE Pager: [phone] After hours Pager: [phone]

## 2018-03-21 NOTE — Progress Notes (Signed)
Physical Therapy Treatment Patient Details Name: Tyler Gray MRN: [id number] DOB: 1933-08-05 Today's Date: 03/21/2018    History of Present Illness Tyler Gray is a 84 y.o. male with medical history significant for nonischemic cardiomyopathy, paroxysmal atrial fibrillation on warfarin, chronic systolic heart failure, AICD, hyperlipidemia, and chronic kidney disease stage III    PT Comments    Pt mobility continues to be extremely limited by bilat KNee pain and R shoulder pain. Discussed with Dr. Ella Jubilee regarding possibility of gout. Pt was amb 40+ feet with RW last week, pt now requiring maxAx2 for all mobility and the stedy for OOB transfer to the chair. Acute PT to con't to follow.  Follow Up Recommendations  SNF;Supervision/Assistance - 24 hour     Equipment Recommendations       Recommendations for Other Services       Precautions / Restrictions Precautions Precautions: Fall Restrictions Weight Bearing Restrictions: No    Mobility  Bed Mobility Overal bed mobility: Needs Assistance Bed Mobility: Supine to Sit     Supine to sit: Mod assist;+2 for physical assistance     General bed mobility comments: max tactile cues and modA to move LEs towards EOB, maxA for trunk elevation and modAx2 to bring to EOB  Transfers Overall transfer level: Needs assistance   Transfers: Sit to/from Stand;Stand Pivot Transfers Sit to Stand: Max assist;+2 safety/equipment;From elevated surface Stand pivot transfers: Total assist       General transfer comment: pt with difficulty following commands, required maxA with gait belt and bed pad to achieve standing in stedy. pt unable to tolerate due to pain in R shld and bilat knees  Ambulation/Gait             General Gait Details: unable due to pain and weakness in bilat LE and R shld   Stairs             Wheelchair Mobility    Modified Rankin (Stroke Patients Only)       Balance Overall balance assessment:  Needs assistance Sitting-balance support: Feet supported Sitting balance-Leahy Scale: Fair Sitting balance - Comments: close supervision, with onset of fatigue pt with posterior lean   Standing balance support: Bilateral upper extremity supported Standing balance-Leahy Scale: Zero Standing balance comment: unable without stedy                            Cognition Arousal/Alertness: Awake/alert Behavior During Therapy: WFL for tasks assessed/performed Overall Cognitive Status: History of cognitive impairments - at baseline Area of Impairment: Orientation;Following commands;Safety/judgement;Problem solving                 Orientation Level: Disoriented to;Time;Situation     Following Commands: Follows one step commands consistently Safety/Judgement: Decreased awareness of safety;Decreased awareness of deficits   Problem Solving: Slow processing;Decreased initiation;Difficulty sequencing;Requires verbal cues;Requires tactile cues General Comments: Pt A&O x3 today. Could not state the month or year correctly. Slight delay in processing,       Exercises General Exercises - Lower Extremity Long Arc Quad: AROM;Both;10 reps;Seated Heel Slides: AROM;AAROM;5 reps;Both;Supine    General Comments General comments (skin integrity, edema, etc.): spoke with Dr. Ella Jubilee regarding the possibility of patient having gout due to sudden onset of bilat knee pain causing pt inability to amb      Pertinent Vitals/Pain Pain Assessment: Faces Faces Pain Scale: Hurts whole lot Pain Location: R shoulder, bilat knees Pain Descriptors / Indicators: Grimacing;Guarding Pain Intervention(s): Limited  activity within patient's tolerance    Home Living                      Prior Function            PT Goals (current goals can now be found in the care plan section) Acute Rehab PT Goals Patient Stated Goal: get stronger Progress towards PT goals: Not progressing toward goals -  comment(due to bilat knee pain and R shld pain)    Frequency    Min 2X/week      PT Plan Current plan remains appropriate    Co-evaluation              AM-PAC PT "6 Clicks" Daily Activity  Outcome Measure  Difficulty turning over in bed (including adjusting bedclothes, sheets and blankets)?: Unable Difficulty moving from lying on back to sitting on the side of the bed? : Unable Difficulty sitting down on and standing up from a chair with arms (e.g., wheelchair, bedside commode, etc,.)?: Unable Help needed moving to and from a bed to chair (including a wheelchair)?: Total Help needed walking in hospital room?: Total Help needed climbing 3-5 steps with a railing? : Total 6 Click Score: 6    End of Session Equipment Utilized During Treatment: Gait belt Activity Tolerance: Patient limited by pain Patient left: in chair;with call bell/phone within reach;with chair alarm set Nurse Communication: Mobility status;Need for lift equipment PT Visit Diagnosis: Unsteadiness on feet (R26.81);Other abnormalities of gait and mobility (R26.89);History of falling (Z91.81);Muscle weakness (generalized) (M62.81);Repeated falls (R29.6)     Time: 2951-8841 PT Time Calculation (min) (ACUTE ONLY): 27 min  Charges:  $Therapeutic Activity: 23-37 mins                    G Codes:       Lewis Shock, PT, DPT Pager #: [phone] Office #: [phone]    Tyler Gray 03/21/2018, 3:06 PM

## 2018-03-21 NOTE — Progress Notes (Addendum)
Palliative:  Discussed with Dr. Cathlean Sauer who was able to speak with wife yesterday. He agrees that she seems to have good understanding of poor prognosis. We discussed that we are unlikely to be able to change plan of care while patient alert and able to participate in discussions. Unfortunately his creatinine continues to incline although he is having urine output. He continues to be weaker with decreasing intake.   I met today with [name] who smiles and greets me. He was resting in recliner when I come in. Lunch tray in front of him untouched. He says he has no appetite. I ask him how he is doing today and he sighs and says "I don't want to talk about that." He seems to have more understanding that he is very ill but no acceptance of continued decline. He is still hopeful for improvement but clear that he does not wish to discuss decline. I offered emotional support. No family at bedside.   15 min  Vinie Sill, NP Palliative Medicine Team Pager # [phone] (M-F 8a-5p) Team Phone # [phone] (Nights/Weekends)

## 2018-03-21 NOTE — Progress Notes (Signed)
ANTICOAGULATION CONSULT NOTE   Pharmacy Consult for Warfarin Indication: atrial fibrillation  No Known Allergies  Labs: Recent Labs    03/19/18 0656 03/20/18 0459 03/21/18 0609  LABPROT 29.0* 30.6* 36.1*  INR 2.76 2.97 3.66  CREATININE 4.50* 4.87* 5.03*   Assessment: 84 yo male admitted for generalized weakness. On Warfarin PTA for Atrial fibrillation.  On admission, INR was supratherapeutic at 3.6. Warfarin held for several days on admission until INR came down to the therapeutic range, and Warfarin resumed 6/30.  Noted Scr rise  INR Today: trended up 3.66 today likely needs lower dose now d/t interaction with amio, no updated CBC today, no overt bleeding noted  Home Warfarin regimen:  3 mg alternating with 4.5 mg every other day.  Meds Reviewed: Interaction with amiodarone (started on 6/30)  Goal of Therapy:  INR 2-3   Plan:  Hold warfarin tonight Likely needs lower dose on d/c d/t interaction with amio Daily INR Monitor for s/sx of bleeding  Lenward Chancellor, PharmD PGY1 Pharmacy Resident Phone [phone] 03/21/2018  10:36 AM

## 2018-03-21 NOTE — Progress Notes (Signed)
I spoke with patient's wife about Tyler Gray condition, poor prognosis and worsening renal failure, significant decline in his condition with uremic symptoms.  Not a good candidate for hemodialysis.  She agrees to proceed with further palliative care services, she will consider home hospice versus palliative care services at the nursing facility.  CODE STATUS will be changed to DNR.

## 2018-03-21 NOTE — Care Management Important Message (Signed)
Important Message  Patient Details  Name: Tyler Gray MRN: [id number] Date of Birth: November 02, 1932   Medicare Important Message Given:  Yes    Melena Hayes P Ethelyn Cerniglia 03/21/2018, 2:14 PM

## 2018-03-21 NOTE — Progress Notes (Signed)
PROGRESS NOTE    Tyler Gray  NWG:[id number] DOB: 07-06-1933 DOA: 03/08/2018 PCP: Renaye Rakers, MD    Brief Narrative:  84 year old male who presented with weakness and fatigue. He does have a significant past medical history for nonischemic cardiomyopathy, paroxysmal atrial fibrillation, chronic systolic heart failure status post AICD, dyslipidemia chronic kidney disease stage III. Patient was found by nursing confused and weak,along with dyspnea and lower extremity edema. On his initial physical examination blood pressure 104/62, heart rate 61, temperature 97.8, respiratory rate 16,oxygen saturation 100%. He has dry mucous membranes, lungs are clear to auscultation bilaterally,heart S1-S2 present and rhythmic, the abdomen was soft nontender, positive lower extremity edema 2+ up to the sacrum.Sodium 142, potassium 4.2, chloride 107, bicarb 21,was 116, BUN 74, creatinine 3.30,white count 14.0, hemoglobin 8.5 hematocrit 26.5, platelets 105, INR 2.1.Urinalysis specific gravity 1.009, white cells greater than 50 RBCs 11-20.  Patient was admitted to the hospital with working diagnosis of decompensated systolic heart failure, complicated by acute kidney injury and hyperkalemia   Assessment & Plan:   Active Problems:   Paroxysmal atrial fibrillation (HCC)   CHRONIC SYSTOLIC HEART FAILURE   Chronic kidney disease, stage IV (severe) (HCC)   Automatic implantable cardioverter-defibrillator in situ   Goals of care, counseling/discussion   AKI (acute kidney injury) (HCC)   Acute lower UTI   Acute systolic CHF (congestive heart failure) (HCC)   Elevated INR   Hyperkalemia   Elevated troponin   Malnutrition of moderate degree   Pressure injury of skin   Palliative care by specialist   1. Acute on chronic systolic heart failure. Improved edema and volume status, limited medical therapy due to renal failure and risk of hemodynamic decompensation, ejection fraction 20 to 25%. Not  able to use ace inh, arb or b blockers.   2. AKI on CKD stage 3. Worsening GFR per cr, today up to 5,03. Improved volume after furosemide with urine output of 1600 ml over last 24 hours, serum K at 4,0 and bicarbonate at 28. BUN is now up 154 and patient is more fatigued and weak. Will hold on further diuresis for now. Check phosphorus. Poor prognosis and risk of worsening renal failure. Case discussed with Dr. Darrick Penna, patient is poor candidate for renal replacement therapy.   3. Chronic atrial fibrillation.Continue rate control with amiodarone and anticoagulation with warfarin, INR 3.66.  4. New moderate diarrhea. Improved, possible bowel wall edema related, continue furosemide as needed.   DVT prophylaxis:warfarin Code Status:full Family Communication:no family at the bedside today.  Disposition Plan:Pending improvement of renal function before transfer to SNF.   Consultants:  Cardiology   Nephrology  Procedures:    Antimicrobials:     Subjective: Patient continue to feel progressively weak and deconditioned, no frank dyspnea, no nausea or vomiting, positive edema lower extremities.    Objective: Vitals:   03/20/18 0451 03/20/18 1125 03/20/18 1952 03/21/18 0544  BP: 108/67 101/70 101/67 105/61  Pulse: 67 63 65 68  Resp: 18 16 20 18   Temp: (!) 97.3 F (36.3 C) 98.6 F (37 C) 98 F (36.7 C) 98.7 F (37.1 C)  TempSrc: Oral Oral Oral Oral  SpO2: 99% 100% 98% 96%  Weight: 73.9 kg (163 lb)   73.5 kg (162 lb)  Height:        Intake/Output Summary (Last 24 hours) at 03/21/2018 1251 Last data filed at 03/21/2018 0900 Gross per 24 hour  Intake 960 ml  Output 700 ml  Net 260 ml   Filed  Weights   03/19/18 0419 03/20/18 0451 03/21/18 0544  Weight: 73.5 kg (162 lb) 73.9 kg (163 lb) 73.5 kg (162 lb)    Examination:   General: deconditioned and ill looking appearing Neurology: Awake and alert, generalized weakness, non focal E ENT: positive  pallor, no icterus, oral mucosa moist Cardiovascular: No JVD. S1-S2 present, rhythmic, no gallops, rubs, or murmurs. +/ ++ pitting edema lower extremity edema (dependent zones) Pulmonary: positive breath sounds bilaterally, poor air movement, no wheezing or rhonchi, scattered rales. Gastrointestinal. Abdomen no organomegaly, non tender, no rebound or guarding Skin. No rashes Musculoskeletal: no joint deformities     Data Reviewed: I have personally reviewed following labs and imaging studies  CBC: No results for input(s): WBC, NEUTROABS, HGB, HCT, MCV, PLT in the last 168 hours. Basic Metabolic Panel: Recent Labs  Lab 03/17/18 0527 03/18/18 0520 03/19/18 0656 03/20/18 0459 03/21/18 0609  NA 141 140 142 141 141  K 4.3 4.2 4.1 3.9 4.0  CL 105 103 105 107 104  CO2 25 24 23 24 28   GLUCOSE 122* 118* 117* 118* 120*  BUN 119* 132* 142* 147* 154*  CREATININE 4.06* 4.41* 4.50* 4.87* 5.03*  CALCIUM 7.8* 7.9* 8.0* 7.7* 7.7*   GFR: Estimated Creatinine Clearance: 10.8 mL/min (A) (by C-G formula based on SCr of 5.03 mg/dL (H)). Liver Function Tests: No results for input(s): AST, ALT, ALKPHOS, BILITOT, PROT, ALBUMIN in the last 168 hours. No results for input(s): LIPASE, AMYLASE in the last 168 hours. No results for input(s): AMMONIA in the last 168 hours. Coagulation Profile: Recent Labs  Lab 03/17/18 0527 03/18/18 0520 03/19/18 0656 03/20/18 0459 03/21/18 0609  INR 2.32 2.64 2.76 2.97 3.66   Cardiac Enzymes: No results for input(s): CKTOTAL, CKMB, CKMBINDEX, TROPONINI in the last 168 hours. BNP (last 3 results) Recent Labs    10/14/17 1209  PROBNP 15,115*   HbA1C: No results for input(s): HGBA1C in the last 72 hours. CBG: Recent Labs  Lab 03/19/18 0751  GLUCAP 105*   Lipid Profile: No results for input(s): CHOL, HDL, LDLCALC, TRIG, CHOLHDL, LDLDIRECT in the last 72 hours. Thyroid Function Tests: No results for input(s): TSH, T4TOTAL, FREET4, T3FREE, THYROIDAB in  the last 72 hours. Anemia Panel: No results for input(s): VITAMINB12, FOLATE, FERRITIN, TIBC, IRON, RETICCTPCT in the last 72 hours.    Radiology Studies: I have reviewed all of the imaging during this hospital visit personally     Scheduled Meds: . amiodarone  400 mg Oral Daily  . atorvastatin  40 mg Oral q1800  . feeding supplement (ENSURE ENLIVE)  237 mL Oral TID BM  . multivitamin with minerals  1 tablet Oral Daily  . Warfarin - Pharmacist Dosing Inpatient   Does not apply q1800   Continuous Infusions:   LOS: 13 days        Candence Sease Annett Gula, MD [facility] Pager [phone]

## 2018-03-21 NOTE — Progress Notes (Signed)
Occupational Therapy Treatment Patient Details Name: Tyler Gray MRN: [id number] DOB: July 09, 1933 Today's Date: 03/21/2018    History of present illness Tyler Gray is a 84 y.o. male with medical history significant for nonischemic cardiomyopathy, paroxysmal atrial fibrillation on warfarin, chronic systolic heart failure, AICD, hyperlipidemia, and chronic kidney disease stage III   OT comments  Pt supine in bed upon entering the room. Staff reporting they just recently transferred pt into bed from recliner chair with use of STEDY. Pt declined bed mobility, standing, and functional transfers this session. Pt was agreeable to B UE strengthening with use of level 1 resistance. Pt also engaged in AROM exercises against gravity for 2 sets of 10 bicep curls, shoulder elevation, alternating punches, and shoulder diagonals. Pt with increased pain in R shoulder during exercises and resistive band not used in order to increase participation and decrease pain.   Follow Up Recommendations  SNF;Supervision/Assistance - 24 hour    Equipment Recommendations  Other (comment)(defer to next venue of care)    Recommendations for Other Services      Precautions / Restrictions Precautions Precautions: Fall Restrictions Weight Bearing Restrictions: No       Mobility Bed Mobility      General bed mobility comments: supine in bed upon entering the room  Transfers    General transfer comment: declined transfer this session        ADL either performed or assessed with clinical judgement        Vision Patient Visual Report: No change from baseline            Cognition Arousal/Alertness: Awake/alert Behavior During Therapy: WFL for tasks assessed/performed Overall Cognitive Status: History of cognitive impairments - at baseline Area of Impairment: Orientation;Following commands;Safety/judgement;Problem solving        Following Commands: Follows one step commands  consistently Safety/Judgement: Decreased awareness of safety;Decreased awareness of deficits   Problem Solving: Slow processing;Requires verbal cues                     Pertinent Vitals/ Pain       Pain Assessment: Faces Faces Pain Scale: Hurts little more Pain Location: R shoulder Pain Descriptors / Indicators: Grimacing;Guarding Pain Intervention(s): Limited activity within patient's tolerance;Monitored during session     Prior Functioning/Environment              Frequency  Min 2X/week        Progress Toward Goals  OT Goals(current goals can now be found in the care plan section)  Progress towards OT goals: Progressing toward goals  Acute Rehab OT Goals Patient Stated Goal: get stronger OT Goal Formulation: With patient Time For Goal Achievement: 04/04/18 Potential to Achieve Goals: Good  Plan Discharge plan remains appropriate    AM-PAC PT "6 Clicks" Daily Activity     Outcome Measure   Help from another person eating meals?: A Little Help from another person taking care of personal grooming?: Total Help from another person toileting, which includes using toliet, bedpan, or urinal?: Total Help from another person bathing (including washing, rinsing, drying)?: A Lot Help from another person to put on and taking off regular upper body clothing?: A Little Help from another person to put on and taking off regular lower body clothing?: A Lot 6 Click Score: 12    End of Session    OT Visit Diagnosis: Unsteadiness on feet (R26.81);Repeated falls (R29.6);Muscle weakness (generalized) (M62.81)   Activity Tolerance Patient tolerated treatment well   Patient  Left with call bell/phone within reach;in bed   Nurse Communication          Time: 1610-9604 OT Time Calculation (min): 21 min  Charges: OT General Charges $OT Visit: 1 Visit OT Treatments $Therapeutic Exercise: 8-22 mins   Ethen Bannan P , MS, OTR/L 03/21/2018, 2:00 PM

## 2018-03-22 LAB — PROTIME-INR
INR: 3.82
PROTHROMBIN TIME: 37.3 s — AB (ref 11.4–15.2)

## 2018-03-22 LAB — RENAL FUNCTION PANEL
ALBUMIN: 2 g/dL — AB (ref 3.5–5.0)
Anion gap: 17 — ABNORMAL HIGH (ref 5–15)
BUN: 160 mg/dL — AB (ref 8–23)
CHLORIDE: 104 mmol/L (ref 98–111)
CO2: 22 mmol/L (ref 22–32)
CREATININE: 5.33 mg/dL — AB (ref 0.61–1.24)
Calcium: 7.9 mg/dL — ABNORMAL LOW (ref 8.9–10.3)
GFR calc Af Amer: 10 mL/min — ABNORMAL LOW (ref >60)
GFR calc non Af Amer: 9 mL/min — ABNORMAL LOW (ref >60)
GLUCOSE: 124 mg/dL — AB (ref 70–99)
PHOSPHORUS: 5.1 mg/dL — AB (ref 2.5–4.6)
Potassium: 4.3 mmol/L (ref 3.5–5.1)
Sodium: 143 mmol/L (ref 135–145)

## 2018-03-22 LAB — IRON AND TIBC
IRON: 32 ug/dL — AB (ref 45–182)
Saturation Ratios: 17 % — ABNORMAL LOW (ref 17.9–39.5)
TIBC: 186 ug/dL — ABNORMAL LOW (ref 250–450)
UIBC: 154 ug/dL

## 2018-03-22 MED ORDER — SIMETHICONE 80 MG PO CHEW
80.0000 mg | CHEWABLE_TABLET | Freq: Four times a day (QID) | ORAL | Status: DC | PRN
  Administered 2018-03-22: 80 mg via ORAL
  Filled 2018-03-22: qty 1

## 2018-03-22 MED ORDER — HYDROMORPHONE HCL 1 MG/ML IJ SOLN
0.5000 mg | INTRAMUSCULAR | Status: DC | PRN
  Administered 2018-03-22 – 2018-03-23 (×4): 0.5 mg via INTRAVENOUS
  Filled 2018-03-22 (×5): qty 0.5

## 2018-03-22 MED ORDER — CHLORPROMAZINE HCL 25 MG PO TABS
25.0000 mg | ORAL_TABLET | Freq: Once | ORAL | Status: AC
  Administered 2018-03-22: 25 mg via ORAL
  Filled 2018-03-22 (×2): qty 1

## 2018-03-22 NOTE — Plan of Care (Signed)
  Problem: Education: Goal: Knowledge of General Education information will improve Outcome: Not Progressing   Problem: Clinical Measurements: Goal: Ability to maintain clinical measurements within normal limits will improve Outcome: Not Progressing Goal: Diagnostic test results will improve Outcome: Not Progressing   Problem: Activity: Goal: Risk for activity intolerance will decrease Outcome: Not Progressing   Problem: Activity: Goal: Capacity to carry out activities will improve Outcome: Not Progressing

## 2018-03-22 NOTE — Progress Notes (Signed)
  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  GFR 16 down below 10  Vol xs .  Severe CM.  Will see if can allow vol to equilibrate even though has xs.  Cr ^ after diuresis. Also R/o component BOO - Pt renal function and overall status worsening. Spouse notified last night and they are moving towards home hospice/ palliative at nursing facility.  - Will sign off at this time.  2 Chronic hydro 3 Anemia will eval 4 HPTH 5 CM severe 6 Pacer    Subjective:   Confused and ROS very limited. Wife was notified yest of worsening status   Objective:   BP 97/62 (BP Location: Right Arm)   Pulse 64   Temp 98.6 F (37 C) (Oral)   Resp 20   Ht 5\' 8"  (1.727 m)   Wt 72.6 kg (160 lb)   SpO2 100%   BMI 24.33 kg/m   Intake/Output Summary (Last 24 hours) at 03/22/2018 1328 Last data filed at 03/22/2018 1000 Gross per 24 hour  Intake 420 ml  Output 450 ml  Net -30 ml   Weight change: -0.907 kg (-2 lb)  Physical Exam: General appearance: cooperative but confused, cachectic, no distress and pale Resp: diminished breath sounds bilaterally Cardio: S1, S2 normal and systolic murmur: systolic ejection 2/6, decrescendo at 2nd left intercostal space GI: soft, pos bs, nontender, liver down 5 cm, ? SP fullness Extremities: edema 2+    Imaging: No results found.  Labs: BMET Recent Labs  Lab 03/16/18 0617 03/17/18 0527 03/18/18 0520 03/19/18 0656 03/20/18 0459 03/21/18 0609 03/22/18 0550  NA 141 141 140 142 141 141 143  K 4.0 4.3 4.2 4.1 3.9 4.0 4.3  CL 104 105 103 105 107 104 104  CO2 24 25 24 23 24 28 22   GLUCOSE 132* 122* 118* 117* 118* 120* 124*  BUN 110* 119* 132* 142* 147* 154* 160*  CREATININE 3.72* 4.06* 4.41* 4.50* 4.87* 5.03* 5.33*  CALCIUM 7.8* 7.8* 7.9* 8.0* 7.7* 7.7* 7.9*  PHOS  --   --   --   --   --   --  5.1*   CBC No results for input(s): WBC, NEUTROABS, HGB, HCT, MCV, PLT in the last 168  hours.  Medications:    . amiodarone  400 mg Oral Daily  . atorvastatin  40 mg Oral q1800  . chlorproMAZINE  25 mg Oral Once  . feeding supplement (ENSURE ENLIVE)  237 mL Oral TID BM  . multivitamin with minerals  1 tablet Oral Daily  . Warfarin - Pharmacist Dosing Inpatient   Does not apply q1800      Paulene Floor, MD 03/22/2018, 1:28 PM

## 2018-03-22 NOTE — Progress Notes (Signed)
ANTICOAGULATION CONSULT NOTE   Pharmacy Consult for Warfarin Indication: atrial fibrillation  No Known Allergies  Labs: Recent Labs    03/20/18 0459 03/21/18 0609 03/22/18 0550  LABPROT 30.6* 36.1* 37.3*  INR 2.97 3.66 3.82  CREATININE 4.87* 5.03* 5.33*   Assessment: 84 yo male admitted for generalized weakness. On Warfarin PTA for Atrial fibrillation.  On admission, INR was supratherapeutic at 3.6. Warfarin held for several days on admission until INR came down to the therapeutic range, and Warfarin resumed 6/30.  Noted Scr rise  INR: trended up 3.82 today likely needs lower dose now d/t interaction with amio, no updated CBC today, no overt bleeding noted  Home Warfarin regimen:  3 mg alternating with 4.5 mg every other day.  Meds Reviewed: Interaction with amiodarone (started on 6/30)  Goal of Therapy:  INR 2-3   Plan:  Hold warfarin tonight Likely needs lower dose on d/c d/t interaction with amio Daily INR Monitor for s/sx of bleeding  Bradley Ferris, PharmD PGY1 Pharmacy Resident Direct Phone: [phone] 03/22/2018  10:37 AM

## 2018-03-22 NOTE — Progress Notes (Signed)
[facility]  PROGRESS NOTE  Tyler Gray ZOX:[id number] DOB: Jun 12, 1933 DOA: 03/08/2018 PCP: Renaye Rakers, MD   Brief HPI:    84 year old male came with weakness and fatigue.  History of nonischemic heart myopathy atrial fibrillation, chronic systolic heart failure, status post AICD, dyslipidemia CKD stage III.  Patient was found to be confused and weak he also complained of dyspnea with lower extremity edema.[name] He was admitted with diagnosis of decompensated systolic heart failure, complicated by acute kidney injury and hyperkalemia.   Subjective   Patient seen and examined, denies shortness of breath.   Assessment/Plan:     1. Acute on chronic systolic CHF-breathing has improved, limited medical therapy due to renal failure and risk of hemodynamic compensation.  Patient's ejection fraction is 20 to 25%.  No ACE ARBs or beta-blockers due to worsening renal function. 2. Acute kidney injury on CKD stage III-patient's GFR is worsening.  Today creatinine is 5.33.  BUN is up to 160.  Patient is a poor candidate for renal replacement therapy as per nephrology.  Poor prognosis. 3. Chronic atrial fibrillation-continue rate control with amiodarone, anticoagulation with warfarin.  INR 3.66 4. Diarrhea-resolved.  Continue Imodium as needed. 5. Goals of care-palliative care was consulted, patient family has decided for comfort measures at this time.  Will consider hospice in the next few days.    DVT prophylaxis: Warfarin  Code Status: DNR  Family Communication: No family at bedside  Disposition Plan: likely skilled nursing facility   Consultants:  Cardiology  Nephrology  Procedures:  None   Antibiotics:   Anti-infectives (From admission, onward)   Start     Dose/Rate Route Frequency Ordered Stop   03/14/18 1130  cefTRIAXone (ROCEPHIN) 1 g in sodium chloride 0.9 % 100 mL IVPB     1 g 200 mL/hr over 30 Minutes Intravenous Every 24 hours 03/14/18 1122 03/16/18 1158   03/09/18 1800  cefTRIAXone (ROCEPHIN) 1 g in sodium chloride 0.9 % 100 mL IVPB  Status:  Discontinued     1 g 200 mL/hr over 30 Minutes Intravenous Every 24 hours 03/08/18 2217 03/11/18 1635   03/08/18 1730  cefTRIAXone (ROCEPHIN) 1 g in sodium chloride 0.9 % 100 mL IVPB     1 g 200 mL/hr over 30 Minutes Intravenous  Once 03/08/18 1717 03/08/18 1807       Objective   Vitals:   03/21/18 1300 03/21/18 1936 03/22/18 0444 03/22/18 1159  BP: 108/70 96/63 (!) 94/57 97/62  Pulse: 71 63 66 64  Resp: (!) 24 20 16 20   Temp: 98.7 F (37.1 C) 98.9 F (37.2 C) 97.8 F (36.6 C) 98.6 F (37 C)  TempSrc: Oral Oral Oral Oral  SpO2: 100% 100% 100% 100%  Weight:   72.6 kg (160 lb)   Height:        Intake/Output Summary (Last 24 hours) at 03/22/2018 1821 Last data filed at 03/22/2018 1340 Gross per 24 hour  Intake 540 ml  Output 450 ml  Net 90 ml   Filed Weights   03/20/18 0451 03/21/18 0544 03/22/18 0444  Weight: 73.9 kg (163 lb) 73.5 kg (162 lb) 72.6 kg (160 lb)     Physical Examination:    General: Appears in no acute distress  Cardiovascular: S1-S2, regular  Respiratory: *Clear to auscultation bilaterally  Abdomen: Soft, nontender, no organomegaly  Extremities: Bilateral edema of the lower extremities  Neurologic: Alert, oriented x3     Data Reviewed: I have personally reviewed following labs and imaging studies  CBG: Recent Labs  Lab 03/19/18 0751  GLUCAP 105*    CBC: No results for input(s): WBC, NEUTROABS, HGB, HCT, MCV, PLT in the last 168 hours.  Basic Metabolic Panel: Recent Labs  Lab 03/18/18 0520 03/19/18 0656 03/20/18 0459 03/21/18 0609 03/22/18 0550  NA 140 142 141 141 143  K 4.2 4.1 3.9 4.0 4.3  CL 103 105 107 104 104  CO2 24 23 24 28 22   GLUCOSE 118* 117* 118* 120* 124*  BUN 132* 142* 147* 154* 160*  CREATININE 4.41* 4.50* 4.87* 5.03* 5.33*  CALCIUM 7.9* 8.0* 7.7* 7.7* 7.9*  PHOS  --   --   --   --  5.1*    Recent Results (from the  past 240 hour(s))  Culture, Urine     Status: Abnormal   Collection Time: 03/14/18 11:23 AM  Result Value Ref Range Status   Specimen Description URINE, RANDOM  Final   Special Requests NONE  Final   Culture (A)  Final    <10,000 COLONIES/mL INSIGNIFICANT GROWTH Performed at [facility], [address], [place], [place] [address]    Report Status 03/15/2018 FINAL  Final     Liver Function Tests: Recent Labs  Lab 03/22/18 0550  ALBUMIN 2.0*   No results for input(s): LIPASE, AMYLASE in the last 168 hours. No results for input(s): AMMONIA in the last 168 hours.  Cardiac Enzymes: No results for input(s): CKTOTAL, CKMB, CKMBINDEX, TROPONINI in the last 168 hours. BNP (last 3 results) Recent Labs    03/08/18 1552  BNP 2,285.4*    ProBNP (last 3 results) Recent Labs    10/14/17 1209  PROBNP 15,115*      Studies: No results found.  Scheduled Meds: . amiodarone  400 mg Oral Daily  . atorvastatin  40 mg Oral q1800  . chlorproMAZINE  25 mg Oral Once  . feeding supplement (ENSURE ENLIVE)  237 mL Oral TID BM  . multivitamin with minerals  1 tablet Oral Daily  . Warfarin - Pharmacist Dosing Inpatient   Does not apply q1800      Time spent: 20 min  Meredeth Ide   [facility] Pager [phone]. If 7PM-7AM, please contact night-coverage at [email or web address], Office  [phone]  password [email or web address]  03/22/2018, 6:21 PM  LOS: 14 days

## 2018-03-22 NOTE — Progress Notes (Addendum)
Palliative:  Mr. Schlicher continues to be alert and interactive today. Confused at times but overall still understands his condition but still resistant to conversation. "I still think I can get better." Renal function continues to worsen. He appears much worse to me today and is uncomfortable, hiccups, and has short episodes that seem like he is almost gasping for air. I asked if he is SOB and he says "maybe a little." He is having pain in his bottom sitting in chair and overall discomfort. Wife is at bedside. Likely cardiorenal syndrome.   I walked out with his wife and she is aware that he continues to worsen. She is disturbed by his discomfort today and tells me "just keep him comfortable." She also confirms DNR status. She reiterates that she wants him comfortable and to have something for pain "more than Tylenol, he is being Tylenol at this point." I discussed plan for adding pain medications and prns to ensure he is not suffering. Emotional support provided.   I think he would be a good candidate for moving towards hospice facility as he has had significant decline over the past 24 hrs. At this time he is still alert and aware enough and would not agree to hospice I am sure. I will continue to support Mr. Shamburg and discuss with his wife GOC as he has been unable and unwilling to discuss GOC.   Plan: - DNR, comfort focused care - Consider hospice over the next few days - Pain medication per request from wife: Dilaudid IV 0.5 mg every 3 hours prn severe pain/SOB - Discussed consideration of 2L oxygen for comfort with RN - Simethicone prn gas as requested by patient - Thorazine po 25 mg x 1 for hiccups as requested by patient  Will follow up tomorrow.   25 min  Yong Channel, NP Palliative Medicine Team Pager # [phone] (M-F 8a-5p) Team Phone # [phone] (Nights/Weekends)

## 2018-03-23 LAB — PROTIME-INR
INR: 3.46
Prothrombin Time: 34.5 seconds — ABNORMAL HIGH (ref 11.4–15.2)

## 2018-03-23 LAB — BASIC METABOLIC PANEL
Anion gap: 17 — ABNORMAL HIGH (ref 5–15)
BUN: 165 mg/dL — ABNORMAL HIGH (ref 8–23)
CALCIUM: 8.1 mg/dL — AB (ref 8.9–10.3)
CHLORIDE: 102 mmol/L (ref 98–111)
CO2: 24 mmol/L (ref 22–32)
Creatinine, Ser: 6.06 mg/dL — ABNORMAL HIGH (ref 0.61–1.24)
GFR calc non Af Amer: 8 mL/min — ABNORMAL LOW (ref >60)
GFR, EST AFRICAN AMERICAN: 9 mL/min — AB (ref >60)
Glucose, Bld: 129 mg/dL — ABNORMAL HIGH (ref 70–99)
Potassium: 4.5 mmol/L (ref 3.5–5.1)
Sodium: 143 mmol/L (ref 135–145)

## 2018-03-23 LAB — PARATHYROID HORMONE, INTACT (NO CA): PTH: 314 pg/mL — ABNORMAL HIGH (ref 15–65)

## 2018-03-23 MED ORDER — POLYVINYL ALCOHOL 1.4 % OP SOLN
1.0000 [drp] | Freq: Four times a day (QID) | OPHTHALMIC | Status: DC | PRN
  Filled 2018-03-23: qty 15

## 2018-03-23 MED ORDER — BIOTENE DRY MOUTH MT LIQD: 15.0000 mL | OROMUCOSAL | Status: DC | PRN

## 2018-03-23 MED ORDER — GLYCOPYRROLATE 0.2 MG/ML IJ SOLN
0.2000 mg | INTRAMUSCULAR | Status: DC | PRN
Start: 2018-03-23 — End: 2018-03-27
  Filled 2018-03-23: qty 1

## 2018-03-23 MED ORDER — ENSURE ENLIVE PO LIQD: 237.0000 mL | Freq: Three times a day (TID) | ORAL | Status: DC | PRN

## 2018-03-23 MED ORDER — LORAZEPAM 2 MG/ML IJ SOLN
0.5000 mg | Freq: Four times a day (QID) | INTRAMUSCULAR | Status: DC | PRN
  Administered 2018-03-27: 0.5 mg via INTRAVENOUS
  Filled 2018-03-23: qty 1

## 2018-03-23 NOTE — Progress Notes (Signed)
ANTICOAGULATION CONSULT NOTE   Pharmacy Consult for Warfarin Indication: atrial fibrillation  No Known Allergies  Labs: Recent Labs    03/21/18 0609 03/22/18 0550 03/23/18 0600  LABPROT 36.1* 37.3* 34.5*  INR 3.66 3.82 3.46  CREATININE 5.03* 5.33* 6.06*   Assessment: 84 yo male admitted for generalized weakness, on warfarin PTA for Atrial fibrillation. On admission, INR supratherapeutic at 3.6. Warfarin was held for several days on admission until INR came down to the therapeutic range. Warfarin resumed 6/30 but held again 7/9-7/10 due to supratherapeutic INR, likely due to interaction with new amiodarone started on 6/30.  INR remains high but down to 3.46 after held doses. No new CBC. No bleed issues documented. Renal function continues to worsen, CrCl now <10.  Home Warfarin regimen:  3 mg alternating with 4.5 mg every other day  Goal of Therapy:  INR 2-3  Monitor platelets by anticoagulation protocol:  yes   Plan:   Hold warfarin again tonight Likely needs lower dose on d/c d/t interaction with amio Daily INR Monitor CBC, for s/sx of bleeding  Babs Bertin, PharmD, BCPS Clinical Pharmacist Clinical phone [phone] Please check AMION for all [facility] contact numbers 03/23/2018 10:40 AM

## 2018-03-23 NOTE — Clinical Social Work Note (Signed)
CSW continues to follow for discharge needs.  Alissandra Geoffroy, CSW [phone]  

## 2018-03-23 NOTE — Progress Notes (Signed)
Occupational Therapy Treatment and Discharge  Patient Details Name: Tyler Gray MRN: [id number] DOB: 06-29-1933 Today's Date: 03/23/2018    History of present illness Tyler Gray is a 84 y.o. male with medical history significant for nonischemic cardiomyopathy, paroxysmal atrial fibrillation on warfarin, chronic systolic heart failure, AICD, hyperlipidemia, and chronic kidney disease stage III   OT comments  Pt with decline in functional status; appears pt and family are pursuing comfort measures at this time. Pt less interactive today and with significant decline in functional mobility. Unable to achieve upright standing with total assist +2 from elevated EOB. OT to sign off at this time. Please re-consult if needs change.    Follow Up Recommendations  SNF;Supervision/Assistance - 24 hour(Hospice?)    Equipment Recommendations  None recommended by OT    Recommendations for Other Services      Precautions / Restrictions Precautions Precautions: Fall Restrictions Weight Bearing Restrictions: No       Mobility Bed Mobility Overal bed mobility: Needs Assistance Bed Mobility: Supine to Sit;Sit to Supine     Supine to sit: Max assist;+2 for physical assistance Sit to supine: Max assist;+2 for physical assistance   General bed mobility comments: Assist for LEs and trunk. Use of bed pad to assist with bed mobility.  Transfers Overall transfer level: Needs assistance Equipment used: 2 person hand held assist Transfers: Sit to/from Stand Sit to Stand: Total assist;+2 physical assistance         General transfer comment: Attempted sit to stand x2 with total assist +2 from elevated EOB; unable to clear pts bottom off EOB. Blocked bil knees and feet.    Balance Overall balance assessment: Needs assistance Sitting-balance support: Feet supported;Bilateral upper extremity supported Sitting balance-Leahy Scale: Fair Sitting balance - Comments: min guard for sitting  balance   Standing balance support: Bilateral upper extremity supported Standing balance-Leahy Scale: Zero                             ADL either performed or assessed with clinical judgement   ADL Overall ADL's : Needs assistance/impaired Eating/Feeding: Maximal assistance;Bed level Eating/Feeding Details (indicate cue type and reason): Attempting to drink water, pt unable to hold cup                                   General ADL Comments: Attempted sit to stand x2 from EOB with total assist +2; unable to clear pts bottom off EOB     Vision       Perception     Praxis      Cognition Arousal/Alertness: Lethargic Behavior During Therapy: Flat affect Overall Cognitive Status: No family/caregiver present to determine baseline cognitive functioning                                 General Comments: Pt following simple one step commands with increased time >50% of the time. Eyes closed throughout most of session but would open to name called. Unable to state location or date this session; perseverating on January 12        Exercises     Shoulder Instructions       General Comments VSS throughout session    Pertinent Vitals/ Pain       Pain Assessment: No/denies pain  Home Living  Prior Functioning/Environment              Frequency           Progress Toward Goals  OT Goals(current goals can now be found in the care plan section)  Progress towards OT goals: Not progressing toward goals - comment(decline in functional status)  Acute Rehab OT Goals Patient Stated Goal: none stated OT Goal Formulation: Patient unable to participate in goal setting  Plan Discharge plan remains appropriate;Other (comment)(pt transitioning to comfort care; OT to sign off)    Co-evaluation    PT/OT/SLP Co-Evaluation/Treatment: Yes Reason for Co-Treatment: Complexity of the  patient's impairments (multi-system involvement);Necessary to address cognition/behavior during functional activity;For patient/therapist safety   OT goals addressed during session: Strengthening/ROM      AM-PAC PT "6 Clicks" Daily Activity     Outcome Measure   Help from another person eating meals?: Total Help from another person taking care of personal grooming?: Total Help from another person toileting, which includes using toliet, bedpan, or urinal?: Total Help from another person bathing (including washing, rinsing, drying)?: Total Help from another person to put on and taking off regular upper body clothing?: Total Help from another person to put on and taking off regular lower body clothing?: Total 6 Click Score: 6    End of Session Equipment Utilized During Treatment: Gait belt;Oxygen  OT Visit Diagnosis: Unsteadiness on feet (R26.81);Repeated falls (R29.6);Muscle weakness (generalized) (M62.81)   Activity Tolerance Patient limited by fatigue;Patient limited by lethargy   Patient Left in bed;with call bell/phone within reach   Nurse Communication Mobility status;Other (comment)(signing off from OT)        Time: 1610-9604 OT Time Calculation (min): 23 min  Charges: OT General Charges $OT Visit: 1 Visit OT Treatments $Therapeutic Activity: 8-22 mins  Tyler Gray A. Tyler Gray, M.S., OTR/L Acute Rehab Department: [phone]   Tyler Gray 03/23/2018, 11:50 AM

## 2018-03-23 NOTE — Progress Notes (Signed)
Nutrition Brief Note  Chart reviewed. Pt now transitioning to comfort care.  No further nutrition interventions warranted at this time.  Please re-consult as needed.   Shatia Sindoni A. Christol Thetford, RD, LDN, CDE Pager: [phone] After hours Pager: [phone]  

## 2018-03-23 NOTE — Progress Notes (Signed)
Physical Therapy Treatment Patient Details Name: Tyler Gray MRN: [id number] DOB: 07-Dec-1932 Today's Date: 03/23/2018    History of Present Illness Tyler Gray is a 84 y.o. male with medical history significant for nonischemic cardiomyopathy, paroxysmal atrial fibrillation on warfarin, chronic systolic heart failure, AICD, hyperlipidemia, and chronic kidney disease stage III.   PT Comments    Pt with decline in functional status, currently requiring maxA+2 to totalA for bed mobility and attempting to stand. Pt with eyes closed majority of session, requiring max cues to participate; not oriented beyond self. Per RN, appears family is pursuing comfort measures at this time with potential d/c to Hospice. Acute PT will sign off at this time. Please re-consult if this changes or new needs arise.    Follow Up Recommendations  SNF;Supervision/Assistance - 24 hour(Hospice?)     Equipment Recommendations       Recommendations for Other Services       Precautions / Restrictions Precautions Precautions: Fall Restrictions Weight Bearing Restrictions: No    Mobility  Bed Mobility Overal bed mobility: Needs Assistance Bed Mobility: Supine to Sit;Sit to Supine     Supine to sit: Max assist;+2 for physical assistance Sit to supine: Max assist;+2 for physical assistance   General bed mobility comments: Assist for LEs and trunk. Use of bed pad to assist with bed mobility.  Transfers Overall transfer level: Needs assistance Equipment used: 2 person hand held assist Transfers: Sit to/from Stand Sit to Stand: Total assist;+2 physical assistance         General transfer comment: Attempted sit to stand x2 with total assist +2 from elevated EOB; unable to clear pts bottom off EOB. Blocked bil knees and feet.  Ambulation/Gait                 Stairs             Wheelchair Mobility    Modified Rankin (Stroke Patients Only)       Balance Overall balance  assessment: Needs assistance Sitting-balance support: Feet supported;Bilateral upper extremity supported Sitting balance-Leahy Scale: Fair Sitting balance - Comments: min guard for sitting balance   Standing balance support: Bilateral upper extremity supported Standing balance-Leahy Scale: Zero                              Cognition Arousal/Alertness: Lethargic Behavior During Therapy: Flat affect Overall Cognitive Status: No family/caregiver present to determine baseline cognitive functioning                                 General Comments: Pt following simple one step commands with increased time >50% of the time. Eyes closed throughout most of session but would open to name called. Unable to state location or date this session; perseverating on January 12      Exercises      General Comments General comments (skin integrity, edema, etc.): VSS throughout session; SpO2 97% on O2 [place]; HR 70s-80s      Pertinent Vitals/Pain Pain Assessment: No/denies pain    Home Living                      Prior Function            PT Goals (current goals can now be found in the care plan section) Acute Rehab PT Goals Patient Stated Goal: none stated Progress  towards PT goals: Not progressing toward goals - comment    Frequency           PT Plan Other (comment)(sign off due to medical decline)    Co-evaluation PT/OT/SLP Co-Evaluation/Treatment: Yes Reason for Co-Treatment: Complexity of the patient's impairments (multi-system involvement);Necessary to address cognition/behavior during functional activity;For patient/therapist safety PT goals addressed during session: Mobility/safety with mobility OT goals addressed during session: Strengthening/ROM      AM-PAC PT "6 Clicks" Daily Activity  Outcome Measure  Difficulty turning over in bed (including adjusting bedclothes, sheets and blankets)?: Unable Difficulty moving from lying on back to  sitting on the side of the bed? : Unable Difficulty sitting down on and standing up from a chair with arms (e.g., wheelchair, bedside commode, etc,.)?: Unable Help needed moving to and from a bed to chair (including a wheelchair)?: Total Help needed walking in hospital room?: Total Help needed climbing 3-5 steps with a railing? : Total 6 Click Score: 6    End of Session Equipment Utilized During Treatment: Gait belt Activity Tolerance: Patient limited by lethargy Patient left: in bed;with call bell/phone within reach Nurse Communication: Mobility status;Need for lift equipment PT Visit Diagnosis: Unsteadiness on feet (R26.81);Other abnormalities of gait and mobility (R26.89);History of falling (Z91.81);Muscle weakness (generalized) (M62.81);Repeated falls (R29.6)     Time: 4098-1191 PT Time Calculation (min) (ACUTE ONLY): 25 min  Charges:  $Therapeutic Activity: 8-22 mins                    G Codes:      Ina Homes, PT, DPT Acute Rehab Services  Pager: [phone]  Malachy Chamber 03/23/2018, 12:04 PM

## 2018-03-23 NOTE — Progress Notes (Signed)
[facility]  PROGRESS NOTE  Tyler Gray WGN:[id number] DOB: Jun 28, 1933 DOA: 03/08/2018 PCP: Renaye Rakers, MD   Brief HPI:    84 year old male came with weakness and fatigue.  History of nonischemic heart myopathy atrial fibrillation, chronic systolic heart failure, status post AICD, dyslipidemia CKD stage III.  Patient was found to be confused and weak he also complained of dyspnea with lower extremity edema.[name] He was admitted with diagnosis of decompensated systolic heart failure, complicated by acute kidney injury and hyperkalemia.   Subjective   Patient seen and examined, lethargic this morning.  Unable to carry a conversation.  Renal function is deteriorating.   Assessment/Plan:     1. Acute on chronic systolic CHF-breathing is stable limited medical therapy due to renal failure and risk of hemodynamic compensation.  Patient's ejection fraction is 20 to 25%.  No ACE ARBs or beta-blockers due to worsening renal function. 2. Acute kidney injury on CKD stage III-patient's GFR is worsening.  Today creatinine is 6.06 BUN is up to 165 oh.  Patient is a poor candidate for renal replacement therapy as per nephrology.  Poor prognosis. 3. Chronic atrial fibrillation-continue rate control with amiodarone, anticoagulation with warfarin.  INR 3.46 4. Diarrhea-resolved.  Continue Imodium as needed. 5. Goals of care-palliative care was consulted, patient family has decided for comfort measures at this time.  Patient will qualify for residential hospice.    DVT prophylaxis: Warfarin  Code Status: DNR  Family Communication: No family at bedside  Disposition Plan:  likely residential hospice.   Consultants:  Cardiology  Nephrology  Procedures:  None   Antibiotics:   Anti-infectives (From admission, onward)   Start     Dose/Rate Route Frequency Ordered Stop   03/14/18 1130  cefTRIAXone (ROCEPHIN) 1 g in sodium chloride 0.9 % 100 mL IVPB     1 g 200 mL/hr over 30 Minutes  Intravenous Every 24 hours 03/14/18 1122 03/16/18 1158   03/09/18 1800  cefTRIAXone (ROCEPHIN) 1 g in sodium chloride 0.9 % 100 mL IVPB  Status:  Discontinued     1 g 200 mL/hr over 30 Minutes Intravenous Every 24 hours 03/08/18 2217 03/11/18 1635   03/08/18 1730  cefTRIAXone (ROCEPHIN) 1 g in sodium chloride 0.9 % 100 mL IVPB     1 g 200 mL/hr over 30 Minutes Intravenous  Once 03/08/18 1717 03/08/18 1807       Objective   Vitals:   03/22/18 1159 03/22/18 2035 03/23/18 0358 03/23/18 1153  BP: 97/62 105/70 116/66 110/67  Pulse: 64 64 62 68  Resp: 20 14 14 20   Temp: 98.6 F (37 C) 98.5 F (36.9 C) 98.5 F (36.9 C) 97.6 F (36.4 C)  TempSrc: Oral Oral Oral Oral  SpO2: 100% 100% 100% 100%  Weight:   72.1 kg (159 lb)   Height:        Intake/Output Summary (Last 24 hours) at 03/23/2018 1340 Last data filed at 03/23/2018 1030 Gross per 24 hour  Intake 100 ml  Output 700 ml  Net -600 ml   Filed Weights   03/21/18 0544 03/22/18 0444 03/23/18 0358  Weight: 73.5 kg (162 lb) 72.6 kg (160 lb) 72.1 kg (159 lb)     Physical Examination:    General: Appears lethargic  Cardiovascular: S1-S2, regular  Respiratory: *Clear to auscultation bilaterally  Abdomen: Soft, nontender, no organomegaly  Extremities: Trace edema bilaterally in the lower extremities  Neurologic: Somnolent, hard to arouse     Data Reviewed: I have personally  reviewed following labs and imaging studies  CBG: Recent Labs  Lab 03/19/18 0751  GLUCAP 105*    CBC: No results for input(s): WBC, NEUTROABS, HGB, HCT, MCV, PLT in the last 168 hours.  Basic Metabolic Panel: Recent Labs  Lab 03/19/18 0656 03/20/18 0459 03/21/18 0609 03/22/18 0550 03/23/18 0600  NA 142 141 141 143 143  K 4.1 3.9 4.0 4.3 4.5  CL 105 107 104 104 102  CO2 23 24 28 22 24   GLUCOSE 117* 118* 120* 124* 129*  BUN 142* 147* 154* 160* 165*  CREATININE 4.50* 4.87* 5.03* 5.33* 6.06*  CALCIUM 8.0* 7.7* 7.7* 7.9* 8.1*   PHOS  --   --   --  5.1*  --     Recent Results (from the past 240 hour(s))  Culture, Urine     Status: Abnormal   Collection Time: 03/14/18 11:23 AM  Result Value Ref Range Status   Specimen Description URINE, RANDOM  Final   Special Requests NONE  Final   Culture (A)  Final    <10,000 COLONIES/mL INSIGNIFICANT GROWTH Performed at [facility], [address], [place], [place] [address]    Report Status 03/15/2018 FINAL  Final     Liver Function Tests: Recent Labs  Lab 03/22/18 0550  ALBUMIN 2.0*   No results for input(s): LIPASE, AMYLASE in the last 168 hours. No results for input(s): AMMONIA in the last 168 hours.  Cardiac Enzymes: No results for input(s): CKTOTAL, CKMB, CKMBINDEX, TROPONINI in the last 168 hours. BNP (last 3 results) Recent Labs    03/08/18 1552  BNP 2,285.4*    ProBNP (last 3 results) Recent Labs    10/14/17 1209  PROBNP 15,115*      Studies: No results found.  Scheduled Meds: . amiodarone  400 mg Oral Daily  . atorvastatin  40 mg Oral q1800  . feeding supplement (ENSURE ENLIVE)  237 mL Oral TID BM  . multivitamin with minerals  1 tablet Oral Daily  . Warfarin - Pharmacist Dosing Inpatient   Does not apply q1800      Time spent: 20 min  Meredeth Ide   [facility] Pager [phone]. If 7PM-7AM, please contact night-coverage at [email or web address], Office  [phone]  password [email or web address]  03/23/2018, 1:40 PM  LOS: 15 days

## 2018-03-23 NOTE — Progress Notes (Signed)
Palliative:  Tyler Gray is very sleepy this morning - he did not arouse to voice and I did not make further attempts to awaken him. Has utilized dilaudid for pain 4 doses in past 24 hrs but appears to be resting comfortably. Spoke with RN who says he has refused pills and breakfast this morning but did ask for water. Creatinine continues to climb and he is having more symptoms. I attempted to reach his wife but no answer. I will continue to try and discuss further with her the option of hospice as she has already told me that she wants him to be comfortable and not to suffer recognizing he is at EOL.   I came back later and met with Tyler Gray at bedside. We had a discussion regarding prognosis as likely days to week. We discussed goals and she decided to stop labs and medications (he has refused them today). We discussed possibility of hospice facility but she wishes to consider and discuss with her family. Full comfort care decided today. Emotional support provided to wife. Tyler Gray continues to be lethargic but aroused for a few moments during my visit.   Exam: Lethargic, arouses briefly, confused. Continues to have episodes almost like gasping (or struggling with secretions?). Denies pain. Frail.   25 min  Vinie Sill, NP Palliative Medicine Team Pager # [phone] (M-F 8a-5p) Team Phone # [phone] (Nights/Weekends)

## 2018-03-24 MED ORDER — HYDROMORPHONE HCL 1 MG/ML IJ SOLN
0.5000 mg | INTRAMUSCULAR | Status: DC | PRN
  Administered 2018-03-24 – 2018-03-26 (×3): 0.5 mg via INTRAVENOUS
  Filled 2018-03-24 (×4): qty 0.5

## 2018-03-24 MED ORDER — HYDROMORPHONE HCL 1 MG/ML IJ SOLN
0.5000 mg | Freq: Four times a day (QID) | INTRAMUSCULAR | Status: DC
  Administered 2018-03-24 – 2018-03-27 (×11): 0.5 mg via INTRAVENOUS
  Filled 2018-03-24 (×10): qty 0.5

## 2018-03-24 MED ORDER — BISACODYL 10 MG RE SUPP
10.0000 mg | Freq: Once | RECTAL | Status: AC
  Administered 2018-03-24: 10 mg via RECTAL
  Filled 2018-03-24: qty 1

## 2018-03-24 NOTE — Consult Note (Signed)
WOC Nurse wound consult note Reason for Consult: Bilateral deep tissue pressure injuries (DTPI) to heels. Wound type: pressure Pressure Injury POA: Yes Measurement: Left 2cm x 2cm  Right 3.5cm x 2.5cm Wound bed: deep maroon to purple discolored tissue Drainage (amount, consistency, odor) None Periwound:intadt, dry Dressing procedure/placement/frequency: Bilateral pressure redistribution heel boots are provided and topical dressings (silicone foam) in place. A mattress replacement with low air loss feature is provided today as patient continues at high risk.   WOC nursing team will not follow, but will remain available to this patient, the nursing and medical teams.  Please re-consult if needed. Thanks, Ladona Mow, MSN, RN, GNP, [facility]  Pager# [phone]

## 2018-03-24 NOTE — Plan of Care (Signed)
  Problem: Pain Managment: Goal: General experience of comfort will improve Outcome: Progressing   Problem: Activity: Goal: Risk for activity intolerance will decrease Outcome: Not Progressing  Pt. On comfort care Problem: Nutrition: Goal: Adequate nutrition will be maintained Outcome: Not Progressing  Comfort care

## 2018-03-24 NOTE — Progress Notes (Signed)
[facility]  PROGRESS NOTE  Tyler Gray:[id number] DOB: 05-12-1933 DOA: 03/08/2018 PCP: Renaye Rakers, MD   Brief HPI:    84 year old male came with weakness and fatigue.  History of nonischemic heart myopathy atrial fibrillation, chronic systolic heart failure, status post AICD, dyslipidemia CKD stage III.  Patient was found to be confused and weak he also complained of dyspnea with lower extremity edema.[name] He was admitted with diagnosis of decompensated systolic heart failure, complicated by acute kidney injury and hyperkalemia.   Subjective   Patient seen and examined, lethargic but able to carry a conversation..   Assessment/Plan:     1. Acute on chronic systolic CHF-breathing is stable limited medical therapy due to renal failure and risk of hemodynamic compensation.  Patient's ejection fraction is 20 to 25%.  No ACE ARBs or beta-blockers due to worsening renal function. 2. Acute kidney injury on CKD stage III-patient's GFR is worsening.  Today creatinine is 6.06 BUN is up to 165 oh.  Patient is a poor candidate for renal replacement therapy as per nephrology.  Poor prognosis. 3. Chronic atrial fibrillation-continue rate control with amiodarone, anticoagulation with warfarin.  INR 3.46 4. Diarrhea-resolved.  Continue Imodium as needed. 5. Goals of care-palliative care was consulted, patient's family has decided for comfort measures only.  Also offered residential hospice family to discuss with other family members today.    DVT prophylaxis: Warfarin  Code Status: DNR  Family Communication: No family at bedside  Disposition Plan:  likely residential hospice.   Consultants:  Cardiology  Nephrology  Procedures:  None   Antibiotics:   Anti-infectives (From admission, onward)   Start     Dose/Rate Route Frequency Ordered Stop   03/14/18 1130  cefTRIAXone (ROCEPHIN) 1 g in sodium chloride 0.9 % 100 mL IVPB     1 g 200 mL/hr over 30 Minutes Intravenous  Every 24 hours 03/14/18 1122 03/16/18 1158   03/09/18 1800  cefTRIAXone (ROCEPHIN) 1 g in sodium chloride 0.9 % 100 mL IVPB  Status:  Discontinued     1 g 200 mL/hr over 30 Minutes Intravenous Every 24 hours 03/08/18 2217 03/11/18 1635   03/08/18 1730  cefTRIAXone (ROCEPHIN) 1 g in sodium chloride 0.9 % 100 mL IVPB     1 g 200 mL/hr over 30 Minutes Intravenous  Once 03/08/18 1717 03/08/18 1807       Objective   Vitals:   03/23/18 0358 03/23/18 1153 03/23/18 2018 03/24/18 0347  BP: 116/66 110/67 99/61 97/69   Pulse: 62 68 69 77  Resp: 14 20 18 16   Temp: 98.5 F (36.9 C) 97.6 F (36.4 C) (!) 97.5 F (36.4 C) 98.1 F (36.7 C)  TempSrc: Oral Oral Oral Oral  SpO2: 100% 100% 100% 98%  Weight: 72.1 kg (159 lb)     Height:        Intake/Output Summary (Last 24 hours) at 03/24/2018 1248 Last data filed at 03/24/2018 1000 Gross per 24 hour  Intake 880 ml  Output 1010 ml  Net -130 ml   Filed Weights   03/21/18 0544 03/22/18 0444 03/23/18 0358  Weight: 73.5 kg (162 lb) 72.6 kg (160 lb) 72.1 kg (159 lb)     Physical Examination:    General: Appears lethargic but arousable and answering questions appropriately  Cardiovascular: S1-S2, regular  Respiratory: Clear to auscultation bilaterally  Abdomen: Soft, nontender, no organomegaly  Extremities: Trace edema in the lower extremities bilaterally  Neurologic: Alert, oriented x3     Data Reviewed:  I have personally reviewed following labs and imaging studies  CBG: Recent Labs  Lab 03/19/18 0751  GLUCAP 105*    CBC: No results for input(s): WBC, NEUTROABS, HGB, HCT, MCV, PLT in the last 168 hours.  Basic Metabolic Panel: Recent Labs  Lab 03/19/18 0656 03/20/18 0459 03/21/18 0609 03/22/18 0550 03/23/18 0600  NA 142 141 141 143 143  K 4.1 3.9 4.0 4.3 4.5  CL 105 107 104 104 102  CO2 23 24 28 22 24   GLUCOSE 117* 118* 120* 124* 129*  BUN 142* 147* 154* 160* 165*  CREATININE 4.50* 4.87* 5.03* 5.33* 6.06*   CALCIUM 8.0* 7.7* 7.7* 7.9* 8.1*  PHOS  --   --   --  5.1*  --     No results found for this or any previous visit (from the past 240 hour(s)).   Liver Function Tests: Recent Labs  Lab 03/22/18 0550  ALBUMIN 2.0*   No results for input(s): LIPASE, AMYLASE in the last 168 hours. No results for input(s): AMMONIA in the last 168 hours.  Cardiac Enzymes: No results for input(s): CKTOTAL, CKMB, CKMBINDEX, TROPONINI in the last 168 hours. BNP (last 3 results) Recent Labs    03/08/18 1552  BNP 2,285.4*    ProBNP (last 3 results) Recent Labs    10/14/17 1209  PROBNP 15,115*      Studies: No results found.  Scheduled Meds: .  HYDROmorphone (DILAUDID) injection  0.5 mg Intravenous Q6H      Time spent: 20 min  Meredeth Ide   [facility] Pager [phone]. If 7PM-7AM, please contact night-coverage at [email or web address], Office  [phone]  password [email or web address]  03/24/2018, 12:48 PM  LOS: 16 days

## 2018-03-24 NOTE — Progress Notes (Signed)
Palliative Medicine RN Note: Daily symptom check. Patient is awake, smiling, ate breakfast. He is confused and unable to carry on a conversation. He is complaining of pain in his back and has gotten no prn medications. I spoke with the RN who reports that he has been refusing them. We discussed a plan for better control, and Dr Phillips Odor scheduled some doses to ensure comfort. He will need daily RN support for ongoing symptom management in the presence of a disease that can cause rapid changes in status.   No family at bedside.  Margret Chance Tavoris Brisk, RN, BSN, [facility] Palliative Medicine Team 03/24/2018 1:47 PM Office [phone]

## 2018-03-25 LAB — BASIC METABOLIC PANEL
ANION GAP: 15 (ref 5–15)
BUN: 177 mg/dL — ABNORMAL HIGH (ref 8–23)
CALCIUM: 8.1 mg/dL — AB (ref 8.9–10.3)
CHLORIDE: 107 mmol/L (ref 98–111)
CO2: 23 mmol/L (ref 22–32)
Creatinine, Ser: 5.87 mg/dL — ABNORMAL HIGH (ref 0.61–1.24)
GFR calc non Af Amer: 8 mL/min — ABNORMAL LOW (ref >60)
GFR, EST AFRICAN AMERICAN: 9 mL/min — AB (ref >60)
GLUCOSE: 136 mg/dL — AB (ref 70–99)
Potassium: 4.7 mmol/L (ref 3.5–5.1)
Sodium: 145 mmol/L (ref 135–145)

## 2018-03-25 MED ORDER — LOPERAMIDE HCL 2 MG PO CAPS
2.0000 mg | ORAL_CAPSULE | Freq: Three times a day (TID) | ORAL | Status: DC | PRN
  Administered 2018-03-25 – 2018-03-27 (×2): 2 mg via ORAL
  Filled 2018-03-25 (×2): qty 1

## 2018-03-25 NOTE — Progress Notes (Signed)
Daily Progress Note   Patient Name: Tyler Gray       Date: 03/25/2018 DOB: May 24, 1933  Age: 84 y.o. MRN#: [id number] Attending Physician: Meredeth Ide, MD Primary Care Physician: Renaye Rakers, MD Admit Date: 03/08/2018  Reason for Consultation/Follow-up: Establishing goals of care  Subjective: Patient is resting in bed with eyes closed. He opens his eyes to touch. Tyler Gray tells me his pain has improved today and closes his eyes after repositioning himself. Per nursing, he is not eating. Immodium was initiated for loose stools.  No family at bedside. Message left for Tyler Gray Tyler Gray to discuss status and to discuss previous palliative conversation regarding hospice home.    Length of Stay: 17  Current Medications: Scheduled Meds:  .  HYDROmorphone (DILAUDID) injection  0.5 mg Intravenous Q6H    Continuous Infusions:   PRN Meds: acetaminophen, antiseptic oral rinse, feeding supplement (ENSURE ENLIVE), glycopyrrolate, HYDROmorphone (DILAUDID) injection, loperamide, LORazepam, [DISCONTINUED] ondansetron **OR** ondansetron (ZOFRAN) IV, polyvinyl alcohol, simethicone  Physical Exam  Constitutional: No distress.  Pulmonary/Chest:  Even and unlabored.  Skin: Skin is warm and dry.            Vital Signs: BP 102/67 (BP Location: Left Arm)   Pulse 71   Temp 98.7 F (37.1 C) (Oral)   Resp 18   Ht 5\' 8"  (1.727 m)   Wt 72.1 kg (159 lb)   SpO2 100%   BMI 24.18 kg/m  SpO2: SpO2: 100 % O2 Device: O2 Device: Room Air O2 Flow Rate: O2 Flow Rate (L/min): 2 L/min  Intake/output summary:   Intake/Output Summary (Last 24 hours) at 03/25/2018 1115 Last data filed at 03/25/2018 0945 Gross per 24 hour  Intake 110 ml  Output 525 ml  Net -415 ml   LBM: Last BM Date:  03/24/18 Baseline Weight: Weight: 73 kg (161 lb) Most recent weight: Weight: 72.1 kg (159 lb)       Palliative Assessment/Data: 10%    Flowsheet Rows     Most Recent Value  Intake Tab  Referral Department  Hospitalist  Unit at Time of Referral  Cardiac/Telemetry Unit  Palliative Care Primary Diagnosis  Cardiac  Date Notified  03/12/18  Palliative Care Type  New Palliative care  Reason for referral  Clarify Goals of Care  Date of Admission  03/08/18  Date first seen by Palliative Care  03/13/18  # of days Palliative referral response time  1 Day(s)  # of days IP prior to Palliative referral  4  Clinical Assessment  Palliative Performance Scale Score  30%  Psychosocial & Spiritual Assessment  Palliative Care Outcomes  Patient/Family meeting held?  Yes  Who was at the meeting?  Tyler Gray  Palliative Care Outcomes  Counseled regarding hospice, Provided psychosocial or spiritual support, ACP counseling assistance, Linked to palliative care logitudinal support      Patient Active Problem List   Diagnosis Date Noted  . Palliative care by specialist   . Malnutrition of moderate degree 03/09/2018  . Pressure injury of skin 03/09/2018  . AKI (acute kidney injury) (HCC) 03/08/2018  . Acute lower UTI 03/08/2018  . Acute systolic CHF (congestive heart failure) (HCC) 03/08/2018  . Elevated INR 03/08/2018  . Hyperkalemia 03/08/2018  . Elevated troponin 03/08/2018  . Pacemaker lead failure 04/28/2017  . Goals of care, counseling/discussion 12/23/2014  . Paroxysmal atrial fibrillation (HCC) 02/10/2010  . CHRONIC SYSTOLIC HEART FAILURE 02/10/2010  . Chronic kidney disease, stage IV (severe) (HCC) 02/10/2010  . Automatic implantable cardioverter-defibrillator in situ 02/10/2010    Palliative Care Assessment & Plan   Patient Profile: 84 y.o. male  with past medical history of a fib on coumadin, nonischemic cardiomyopathy, chronic systolic CHF s/p biventricular pacemaker (EF 20%), right  nephrolithiasis with obstruction, and CKD 3 admitted on 03/08/2018 with weakness, shortness of breath, and BLE edema. Also found to have UTI. Palliative care consulted for continued goals of care discussions as he is entering into advanced CKD but not felt to be a dialysis candidate.   Recommendations/Plan: Pain is better controlled. No changes to current regimen.  Message left for Tyler Gray to discuss status, and to follow up previous conversation regarding hospice home transfer.    Goals of Care and Additional Recommendations:  Limitations on Scope of Treatment: Full Comfort Care  Code Status:    Code Status Orders  (From admission, onward)        Start     Ordered   03/23/18 1533  Do not attempt resuscitation (DNR)  Continuous    Question Answer Comment  In the event of cardiac or respiratory ARREST Do not call a "code blue"   In the event of cardiac or respiratory ARREST Do not perform Intubation, CPR, defibrillation or ACLS   In the event of cardiac or respiratory ARREST Use medication by any route, position, wound care, and other measures to relive pain and suffering. May use oxygen, suction and manual treatment of airway obstruction as needed for comfort.      03/23/18 1532    Code Status History    Date Active Date Inactive Code Status Order ID Comments User Context   03/21/2018 1700 03/23/2018 1532 DNR [id number]  Coralie Keens, MD Inpatient   03/08/2018 2118 03/21/2018 1700 Full Code [id number]  Laverna Peace, MD Inpatient   04/13/2017 1526 04/13/2017 2047 Full Code [id number]  Marinus Maw, MD Inpatient       Prognosis:   < 2 weeks  Discharge Planning:  To Be Determined  Care plan was discussed with RN  Thank you for allowing the Palliative Medicine Team to assist in the care of this patient.   Total Time 15 min Prolonged Time Billed  No       Greater than 50%  of this time was spent counseling and coordinating care related to the  above assessment and  plan.  Morton Stall, NP  Please contact Palliative Medicine Team phone at [phone] for questions and concerns.

## 2018-03-25 NOTE — Progress Notes (Signed)
Tyler Muscat NP notified that the patient may still have his internal defibrillator active.  Currently DNR and comfort care.  Requested that I call back if patients condition changes.  Will continue to monitor.

## 2018-03-25 NOTE — Progress Notes (Signed)
[facility]  PROGRESS NOTE  AMAAD ELIASSEN VVK:[id number] DOB: April 07, 1933 DOA: 03/08/2018 PCP: Renaye Rakers, MD   Brief HPI:    84 year old male came with weakness and fatigue.  History of nonischemic heart myopathy atrial fibrillation, chronic systolic heart failure, status post AICD, dyslipidemia CKD stage III.  Patient was found to be confused and weak he also complained of dyspnea with lower extremity edema.[name] He was admitted with diagnosis of decompensated systolic heart failure, complicated by acute kidney injury and hyperkalemia.   Subjective   Patient seen and examined, continues to lethargic, denies any pain or shortness of breath.   Assessment/Plan:     1. Acute on chronic systolic CHF-breathing is stable limited medical therapy due to renal failure and risk of hemodynamic compensation.  Patient's ejection fraction is 20 to 25%.  No ACE ARBs or beta-blockers due to worsening renal function. 2. Acute kidney injury on CKD stage III-patient's GFR is worsening.  Today creatinine is 5.87 BUN is up to 177.  Patient is not a  candidate for renal replacement therapy as per nephrology.  Poor prognosis. 3. Chronic atrial fibrillation-continue rate control with amiodarone, anticoagulation with warfarin.  INR 3.46 4. Diarrhea-resolved.  Continue Imodium as needed. 5. Goals of care-palliative care was consulted, patient's family has decided for comfort measures only.  Also offered residential hospice family to discuss with other family members today.    DVT prophylaxis: Warfarin  Code Status: DNR  Family Communication: No family at bedside  Disposition Plan:  likely residential hospice.   Consultants:  Cardiology  Nephrology  Procedures:  None   Antibiotics:   Anti-infectives (From admission, onward)   Start     Dose/Rate Route Frequency Ordered Stop   03/14/18 1130  cefTRIAXone (ROCEPHIN) 1 g in sodium chloride 0.9 % 100 mL IVPB     1 g 200 mL/hr over 30  Minutes Intravenous Every 24 hours 03/14/18 1122 03/16/18 1158   03/09/18 1800  cefTRIAXone (ROCEPHIN) 1 g in sodium chloride 0.9 % 100 mL IVPB  Status:  Discontinued     1 g 200 mL/hr over 30 Minutes Intravenous Every 24 hours 03/08/18 2217 03/11/18 1635   03/08/18 1730  cefTRIAXone (ROCEPHIN) 1 g in sodium chloride 0.9 % 100 mL IVPB     1 g 200 mL/hr over 30 Minutes Intravenous  Once 03/08/18 1717 03/08/18 1807       Objective   Vitals:   03/23/18 1153 03/23/18 2018 03/24/18 0347 03/24/18 2148  BP: 110/67 99/61 97/69  102/67  Pulse: 68 69 77 71  Resp: 20 18 16 18   Temp: 97.6 F (36.4 C) (!) 97.5 F (36.4 C) 98.1 F (36.7 C) 98.7 F (37.1 C)  TempSrc: Oral Oral Oral Oral  SpO2: 100% 100% 98% 100%  Weight:      Height:        Intake/Output Summary (Last 24 hours) at 03/25/2018 1239 Last data filed at 03/25/2018 0945 Gross per 24 hour  Intake 110 ml  Output 525 ml  Net -415 ml   Filed Weights   03/21/18 0544 03/22/18 0444 03/23/18 0358  Weight: 73.5 kg (162 lb) 72.6 kg (160 lb) 72.1 kg (159 lb)     Physical Examination:    General: Lethargic, answers questions appropriately  Cardiovascular: S1-S2, regular  Respiratory: Clear to auscultation bilaterally  Abdomen: Soft, tender, no organomegaly  Extremities: Trace edema of the lower extremities  Neurologic:  alert, oriented x3     Data Reviewed: I have personally reviewed  following labs and imaging studies  CBG: Recent Labs  Lab 03/19/18 0751  GLUCAP 105*    CBC: No results for input(s): WBC, NEUTROABS, HGB, HCT, MCV, PLT in the last 168 hours.  Basic Metabolic Panel: Recent Labs  Lab 03/20/18 0459 03/21/18 0609 03/22/18 0550 03/23/18 0600 03/25/18 0605  NA 141 141 143 143 145  K 3.9 4.0 4.3 4.5 4.7  CL 107 104 104 102 107  CO2 24 28 22 24 23   GLUCOSE 118* 120* 124* 129* 136*  BUN 147* 154* 160* 165* 177*  CREATININE 4.87* 5.03* 5.33* 6.06* 5.87*  CALCIUM 7.7* 7.7* 7.9* 8.1* 8.1*  PHOS   --   --  5.1*  --   --     No results found for this or any previous visit (from the past 240 hour(s)).   Liver Function Tests: Recent Labs  Lab 03/22/18 0550  ALBUMIN 2.0*   No results for input(s): LIPASE, AMYLASE in the last 168 hours. No results for input(s): AMMONIA in the last 168 hours.  Cardiac Enzymes: No results for input(s): CKTOTAL, CKMB, CKMBINDEX, TROPONINI in the last 168 hours. BNP (last 3 results) Recent Labs    03/08/18 1552  BNP 2,285.4*    ProBNP (last 3 results) Recent Labs    10/14/17 1209  PROBNP 15,115*      Studies: No results found.  Scheduled Meds: .  HYDROmorphone (DILAUDID) injection  0.5 mg Intravenous Q6H      Time spent: 20 min  Meredeth Ide   [facility] Pager [phone]. If 7PM-7AM, please contact night-coverage at [email or web address], Office  [phone]  password [email or web address]  03/25/2018, 12:39 PM  LOS: 17 days

## 2018-03-26 DIAGNOSIS — E875 Hyperkalemia: Secondary | ICD-10-CM

## 2018-03-26 DIAGNOSIS — Z515 Encounter for palliative care: Secondary | ICD-10-CM

## 2018-03-26 LAB — BASIC METABOLIC PANEL
Anion gap: 16 — ABNORMAL HIGH (ref 5–15)
BUN: 199 mg/dL — AB (ref 8–23)
CALCIUM: 8.2 mg/dL — AB (ref 8.9–10.3)
CO2: 22 mmol/L (ref 22–32)
Chloride: 109 mmol/L (ref 98–111)
Creatinine, Ser: 6.05 mg/dL — ABNORMAL HIGH (ref 0.61–1.24)
GFR calc Af Amer: 9 mL/min — ABNORMAL LOW (ref >60)
GFR, EST NON AFRICAN AMERICAN: 8 mL/min — AB (ref >60)
GLUCOSE: 118 mg/dL — AB (ref 70–99)
POTASSIUM: 4.8 mmol/L (ref 3.5–5.1)
Sodium: 147 mmol/L — ABNORMAL HIGH (ref 135–145)

## 2018-03-26 NOTE — Progress Notes (Signed)
Daily Progress Note   Patient Name: Tyler Gray       Date: 03/26/2018 DOB: 05-23-1933  Age: 84 y.o. MRN#: [id number] Attending Physician: Meredeth Ide, MD Primary Care Physician: Renaye Rakers, MD Admit Date: 03/08/2018  Reason for Consultation/Follow-up: Establishing goals of care  Subjective: Upon arrival to room, patient appears comfortable without acute distress. He wakes to voice and will answer some questions. Speaks of having pain "all over." Had sips for breakfast. Notified RN to give scheduled dilaudid.   No family at bedside. Per RN, family still has not made decision to transfer to hospice facility.   Length of Stay: 18  Current Medications: Scheduled Meds:  .  HYDROmorphone (DILAUDID) injection  0.5 mg Intravenous Q6H    Continuous Infusions:   PRN Meds: acetaminophen, antiseptic oral rinse, feeding supplement (ENSURE ENLIVE), glycopyrrolate, HYDROmorphone (DILAUDID) injection, loperamide, LORazepam, [DISCONTINUED] ondansetron **OR** ondansetron (ZOFRAN) IV, polyvinyl alcohol, simethicone  Physical Exam  Constitutional: He is easily aroused. No distress.  C/o generalized pain  HENT:  Head: Normocephalic and atraumatic.  Pulmonary/Chest: No accessory muscle usage. No tachypnea. No respiratory distress.  Even and unlabored.  Abdominal: Normal appearance. There is no tenderness.  Neurological: He is easily aroused.  Wakes to voice, will answer questions. Drowsy  Skin: Skin is warm and dry.  Psychiatric: He has a normal mood and affect. His speech is delayed. He is inattentive.  Nursing note and vitals reviewed.           Vital Signs: BP 101/64 (BP Location: Left Arm)   Pulse 64   Temp 97.6 F (36.4 C) (Oral)   Resp 18   Ht 5\' 8"  (1.727 m)   Wt 72.1 kg (159  lb)   SpO2 100%   BMI 24.18 kg/m  SpO2: SpO2: 100 % O2 Device: O2 Device: Nasal Cannula O2 Flow Rate: O2 Flow Rate (L/min): 2 L/min  Intake/output summary:   Intake/Output Summary (Last 24 hours) at 03/26/2018 1108 Last data filed at 03/26/2018 1000 Gross per 24 hour  Intake 20 ml  Output 500 ml  Net -480 ml   LBM: Last BM Date: 03/25/18 Baseline Weight: Weight: 73 kg (161 lb) Most recent weight: Weight: 72.1 kg (159 lb)       Palliative Assessment/Data: 20%   Flowsheet Rows  Most Recent Value  Intake Tab  Referral Department  Hospitalist  Unit at Time of Referral  Cardiac/Telemetry Unit  Palliative Care Primary Diagnosis  Cardiac  Date Notified  03/12/18  Palliative Care Type  New Palliative care  Reason for referral  Clarify Goals of Care  Date of Admission  03/08/18  Date first seen by Palliative Care  03/13/18  # of days Palliative referral response time  1 Day(s)  # of days IP prior to Palliative referral  4  Clinical Assessment  Palliative Performance Scale Score  30%  Psychosocial & Spiritual Assessment  Palliative Care Outcomes  Patient/Family meeting held?  Yes  Who was at the meeting?  wife  Palliative Care Outcomes  Counseled regarding hospice, Provided psychosocial or spiritual support, ACP counseling assistance, Linked to palliative care logitudinal support      Patient Active Problem List   Diagnosis Date Noted  . Palliative care by specialist   . Malnutrition of moderate degree 03/09/2018  . Pressure injury of skin 03/09/2018  . AKI (acute kidney injury) (HCC) 03/08/2018  . Acute lower UTI 03/08/2018  . Acute systolic CHF (congestive heart failure) (HCC) 03/08/2018  . Elevated INR 03/08/2018  . Hyperkalemia 03/08/2018  . Elevated troponin 03/08/2018  . Pacemaker lead failure 04/28/2017  . Goals of care, counseling/discussion 12/23/2014  . Paroxysmal atrial fibrillation (HCC) 02/10/2010  . CHRONIC SYSTOLIC HEART FAILURE 02/10/2010  .  Chronic kidney disease, stage IV (severe) (HCC) 02/10/2010  . Automatic implantable cardioverter-defibrillator in situ 02/10/2010    Palliative Care Assessment & Plan   Patient Profile: 84 y.o. male  with past medical history of a fib on coumadin, nonischemic cardiomyopathy, chronic systolic CHF s/p biventricular pacemaker (EF 20%), right nephrolithiasis with obstruction, and CKD 3 admitted on 03/08/2018 with weakness, shortness of breath, and BLE edema. Also found to have UTI. Palliative care consulted for continued goals of care discussions as he is entering into advanced CKD but not felt to be a dialysis candidate.  Recommendations/Plan:  Comfort measures only.  Continue current plan of care for symptom management including scheduled dilaudid.   Family still deciding on transfer to residential hospice facility. Patient stable for transfer.   Pending [employer] representative to turn off AICD.   Goals of Care and Additional Recommendations:  Limitations on Scope of Treatment: Full Comfort Care  Code Status:  DNR  Code Status Orders  (From admission, onward)        Start     Ordered   03/23/18 1533  Do not attempt resuscitation (DNR)  Continuous    Question Answer Comment  In the event of cardiac or respiratory ARREST Do not call a "code blue"   In the event of cardiac or respiratory ARREST Do not perform Intubation, CPR, defibrillation or ACLS   In the event of cardiac or respiratory ARREST Use medication by any route, position, wound care, and other measures to relive pain and suffering. May use oxygen, suction and manual treatment of airway obstruction as needed for comfort.      03/23/18 1532    Code Status History    Date Active Date Inactive Code Status Order ID Comments User Context   03/21/2018 1700 03/23/2018 1532 DNR [id number]  Coralie Keens, MD Inpatient   03/08/2018 2118 03/21/2018 1700 Full Code [id number]  Laverna Peace, MD Inpatient   04/13/2017 1526  04/13/2017 2047 Full Code [id number]  Marinus Maw, MD Inpatient       Prognosis:   <  2 weeks  Discharge Planning:  To Be Determined  Care plan was discussed with RN, patient  Thank you for allowing the Palliative Medicine Team to assist in the care of this patient.   Total Time Prolonged Time Billed  No       Greater than 50%  of this time was spent counseling and coordinating care related to the above assessment and plan.  Vennie Homans, FNP-C Palliative Medicine Team  Phone: [phone] Fax: [phone]  Please contact Palliative Medicine Team phone at [phone] for questions and concerns.

## 2018-03-26 NOTE — Progress Notes (Signed)
[place] 3E-09 -- [facility] of  [facility]) [place] RN Visit at [place] request from [place], [place] for family interest in [place] with request for transfer Monday March 27, 2018. Chart reviewed and received report from bedside RN [employer]. Met with patient and family to confirm interest and explain services. Family agreeable to transfer tomorrow, Monday March 27, 2018. CSW aware. Registration paperwork completed. Dr. Orpah Melter to assume care per family request.   Please fax discharge summary to [phone].  RN please call report to [phone].  Please arrange for transport for patient to arrive before noon if possible.  Thank you, Margaretmary Eddy, RN, [facility] Liaison [phone]  [place] are on AMION.

## 2018-03-26 NOTE — Progress Notes (Signed)
ICD  Was permanently disabled for hospice reason. See shadow chart for a hard copy.

## 2018-03-26 NOTE — Progress Notes (Signed)
Patient was turned and cleaned from loose BM, will turn again in 2 hours.

## 2018-03-26 NOTE — Progress Notes (Signed)
[facility]  PROGRESS NOTE  Tyler Gray OIL:[id number] DOB: Jun 17, 1933 DOA: 03/08/2018 PCP: Renaye Rakers, MD   Brief HPI:    84 year old male came with weakness and fatigue.  History of nonischemic heart myopathy atrial fibrillation, chronic systolic heart failure, status post AICD, dyslipidemia CKD stage III.  Patient was found to be confused and weak he also complained of dyspnea with lower extremity edema.[name] He was admitted with diagnosis of decompensated systolic heart failure, complicated by acute kidney injury and hyperkalemia.   Subjective   Patient seen and examined, continues to be lethargic.   Assessment/Plan:     1. Acute on chronic systolic CHF-breathing is stable limited medical therapy due to renal failure and risk of hemodynamic compensation.  Patient's ejection fraction is 20 to 25%.  No ACE ARBs or beta-blockers due to worsening renal function. 2. Acute kidney injury on CKD stage III-patient's GFR is worsening.  Today creatinine is 6.05 BUN is up to 199.  Patient is not a  candidate for renal replacement therapy as per nephrology.  Poor prognosis. 3. Chronic atrial fibrillation-continue rate control with amiodarone, anticoagulation with warfarin.  INR 3.46 4. Diarrhea-resolved.  Continue Imodium as needed. 5. Goals of care-I called and discussed with patient's wife on phone, she agrees to deactivate AICD.  Also agrees for residential hospice.  Will consult clinical social worker consult for residential hospice placement.    DVT prophylaxis: Warfarin  Code Status: DNR  Family Communication: Discussed with patient's wife on phone.  Disposition Plan:  residential hospice.   Consultants:  Cardiology  Nephrology  Procedures:  None   Antibiotics:   Anti-infectives (From admission, onward)   Start     Dose/Rate Route Frequency Ordered Stop   03/14/18 1130  cefTRIAXone (ROCEPHIN) 1 g in sodium chloride 0.9 % 100 mL IVPB     1 g 200 mL/hr over 30  Minutes Intravenous Every 24 hours 03/14/18 1122 03/16/18 1158   03/09/18 1800  cefTRIAXone (ROCEPHIN) 1 g in sodium chloride 0.9 % 100 mL IVPB  Status:  Discontinued     1 g 200 mL/hr over 30 Minutes Intravenous Every 24 hours 03/08/18 2217 03/11/18 1635   03/08/18 1730  cefTRIAXone (ROCEPHIN) 1 g in sodium chloride 0.9 % 100 mL IVPB     1 g 200 mL/hr over 30 Minutes Intravenous  Once 03/08/18 1717 03/08/18 1807       Objective   Vitals:   03/24/18 2148 03/25/18 1255 03/26/18 0447 03/26/18 1205  BP: 102/67 94/74 101/64 107/66  Pulse: 71 73 64 71  Resp: 18 20 18 20   Temp: 98.7 F (37.1 C) (!) 97.5 F (36.4 C) 97.6 F (36.4 C) 98.5 F (36.9 C)  TempSrc: Oral Oral Oral Oral  SpO2: 100% 100% 100% 100%  Weight:      Height:        Intake/Output Summary (Last 24 hours) at 03/26/2018 1206 Last data filed at 03/26/2018 1000 Gross per 24 hour  Intake 20 ml  Output 500 ml  Net -480 ml   Filed Weights   03/21/18 0544 03/22/18 0444 03/23/18 0358  Weight: 73.5 kg (162 lb) 72.6 kg (160 lb) 72.1 kg (159 lb)     Physical Examination:    Lungs: Normal respiratory effort, bilateral clear to auscultation, no crackles or wheezes.  Heart: Regular rate and rhythm, S1 and S2 normal, no murmurs, rubs auscultated Abdomen: BS normoactive,soft,nondistended,non-tender to palpation,no organomegaly Extremities: No pretibial edema, no erythema, no cyanosis, no clubbing Neuro :  Alert , lethargic     Data Reviewed: I have personally reviewed following labs and imaging studies  CBG: No results for input(s): GLUCAP in the last 168 hours.  CBC: No results for input(s): WBC, NEUTROABS, HGB, HCT, MCV, PLT in the last 168 hours.  Basic Metabolic Panel: Recent Labs  Lab 03/21/18 0609 03/22/18 0550 03/23/18 0600 03/25/18 0605 03/26/18 0607  NA 141 143 143 145 147*  K 4.0 4.3 4.5 4.7 4.8  CL 104 104 102 107 109  CO2 28 22 24 23 22   GLUCOSE 120* 124* 129* 136* 118*  BUN 154* 160* 165*  177* 199*  CREATININE 5.03* 5.33* 6.06* 5.87* 6.05*  CALCIUM 7.7* 7.9* 8.1* 8.1* 8.2*  PHOS  --  5.1*  --   --   --     No results found for this or any previous visit (from the past 240 hour(s)).   Liver Function Tests: Recent Labs  Lab 03/22/18 0550  ALBUMIN 2.0*   No results for input(s): LIPASE, AMYLASE in the last 168 hours. No results for input(s): AMMONIA in the last 168 hours.  Cardiac Enzymes: No results for input(s): CKTOTAL, CKMB, CKMBINDEX, TROPONINI in the last 168 hours. BNP (last 3 results) Recent Labs    03/08/18 1552  BNP 2,285.4*    ProBNP (last 3 results) Recent Labs    10/14/17 1209  PROBNP 15,115*      Studies: No results found.  Scheduled Meds: .  HYDROmorphone (DILAUDID) injection  0.5 mg Intravenous Q6H      Time spent: 20 min  Meredeth Ide   [facility] Pager [phone]. If 7PM-7AM, please contact night-coverage at [email or web address], Office  [phone]  password [email or web address]  03/26/2018, 12:06 PM  LOS: 18 days

## 2018-03-26 NOTE — Progress Notes (Signed)
B.Strader,PA with cardiology placed order for ICD turn off, moments after calling palliative team. Apologies for any inconvenience on the timing of calls.

## 2018-03-26 NOTE — Progress Notes (Signed)
    Notified by the admitting team that the patient has been made DNR and is planning to discharge to residential hospice. Asked to help with turning off his ICD. He has a [employer] device by review of the chart. I have reached out to the local [employer] representative to assist with this prior to discharge.   Signed, Ellsworth Lennox, PA-C 03/26/2018, 9:38 AM Pager: [phone]

## 2018-03-26 NOTE — Progress Notes (Signed)
Contacted [facility] for referral. Scheduled visit for assessment for pt at bedside. Possible d/c tomorrow for hospice placement.

## 2018-03-27 LAB — BASIC METABOLIC PANEL
ANION GAP: 17 — AB (ref 5–15)
BUN: 208 mg/dL — ABNORMAL HIGH (ref 8–23)
CO2: 22 mmol/L (ref 22–32)
Calcium: 8.2 mg/dL — ABNORMAL LOW (ref 8.9–10.3)
Chloride: 112 mmol/L — ABNORMAL HIGH (ref 98–111)
Creatinine, Ser: 6.31 mg/dL — ABNORMAL HIGH (ref 0.61–1.24)
GFR calc Af Amer: 8 mL/min — ABNORMAL LOW (ref >60)
GFR, EST NON AFRICAN AMERICAN: 7 mL/min — AB (ref >60)
GLUCOSE: 111 mg/dL — AB (ref 70–99)
POTASSIUM: 4.8 mmol/L (ref 3.5–5.1)
Sodium: 151 mmol/L — ABNORMAL HIGH (ref 135–145)

## 2018-03-27 MED ORDER — LOPERAMIDE HCL 2 MG PO CAPS: 2.0000 mg | ORAL_CAPSULE | Freq: Three times a day (TID) | ORAL | 0 refills | Status: AC | PRN

## 2018-03-27 MED ORDER — GLYCOPYRROLATE 0.2 MG/ML IJ SOLN: 0.2000 mg | INTRAMUSCULAR | Status: AC | PRN

## 2018-03-27 MED ORDER — SIMETHICONE 80 MG PO CHEW: 80.0000 mg | CHEWABLE_TABLET | Freq: Four times a day (QID) | ORAL | 0 refills | Status: AC | PRN

## 2018-03-27 NOTE — Care Management Important Message (Signed)
Important Message  Patient Details  Name: Tyler Gray MRN: [id number] Date of Birth: 06-16-1933   Medicare Important Message Given:  Yes    Shantal Roan P Melinna Linarez 03/27/2018, 3:13 PM

## 2018-03-27 NOTE — Progress Notes (Signed)
Wife at bedside, transported by [facility].

## 2018-03-27 NOTE — Clinical Social Work Note (Signed)
CSW facilitated patient discharge including contacting patient family (Wife) and facility to confirm patient discharge plans. Clinical information faxed to facility and family agreeable with plan. CSW arranged ambulance transport via [facility] to [facility]. RN to call report prior to discharge [phone]).  CSW will sign off for now as social work intervention is no longer needed. Please consult [place] again if new needs arise.  Charlynn Court, CSW [phone]

## 2018-03-27 NOTE — Discharge Summary (Signed)
Physician Discharge Summary  Tyler Gray:[id number] DOB: 1933-03-30 DOA: 03/08/2018  PCP: Renaye Rakers, MD  Admit date: 03/08/2018 Discharge date: 03/27/2018  Time spent: *25 minutes  Recommendations for Outpatient Follow-up:  1. Patient discharged to residential hospice   Discharge Diagnoses:  Active Problems:   Paroxysmal atrial fibrillation (HCC)   CHRONIC SYSTOLIC HEART FAILURE   Chronic kidney disease, stage IV (severe) (HCC)   Automatic implantable cardioverter-defibrillator in situ   Goals of care, counseling/discussion   Acute kidney injury superimposed on chronic kidney disease (HCC)   Acute lower UTI   Acute systolic CHF (congestive heart failure) (HCC)   Elevated INR   Hyperkalemia   Elevated troponin   Malnutrition of moderate degree   Pressure injury of skin   Palliative care by specialist   Terminal care   Discharge Condition: Stable  Diet recommendation: Comfort diet  Filed Weights   03/21/18 0544 03/22/18 0444 03/23/18 0358  Weight: 73.5 kg (162 lb) 72.6 kg (160 lb) 72.1 kg (159 lb)    History of present illness:  84 year old male came with weakness and fatigue.  History of nonischemic heart myopathy atrial fibrillation, chronic systolic heart failure, status post AICD, dyslipidemia CKD stage III.  Patient was found to be confused and weak he also complained of dyspnea with lower extremity edema.[name] He was admitted with diagnosis of decompensated systolic heart failure, complicated by acute kidney injury and hyperkalemia.  Hospital Course:  1. Acute on chronic systolic CHF-breathing is stable limited medical therapy due to renal failure and risk of hemodynamic compensation.  Patient's ejection fraction is 20 to 25%.  No ACE ARBs or beta-blockers due to worsening renal function. 2. Acute kidney injury on CKD stage III-patient's GFR is worsening.  Today creatinine is 6.05 BUN is up to 199.  Patient is not a  candidate for renal replacement therapy as per  nephrology.  Poor prognosis. 3. Chronic atrial fibrillation- he is off amiodarone and warfarin due to comfort measures only.Will not restart Coreg 4. Diarrhea-resolved.  Continue Imodium as needed. 5. Goals of care-I called and discussed with patient's wife on phone, she agrees to deactivate AICD.  Also agrees for residential hospice. Patient will be transferred to residential hospice.     Procedures:  None   Consultations:  Cardiology  Nephrology   Discharge Exam: Vitals:   03/27/18 0200 03/27/18 0443  BP: 99/66 105/70  Pulse: 79 70  Resp: 20 18  Temp:  97.8 F (36.6 C)  SpO2: 100% 100%    General: Somnolent, moans Cardiovascular: S1S2, RRR Respiratory: Clear bilaterally  Discharge Instructions   Discharge Instructions    Diet - low sodium heart healthy   Complete by:  As directed    Increase activity slowly   Complete by:  As directed      Allergies as of 03/27/2018   No Known Allergies     Medication List    STOP taking these medications   atorvastatin 40 MG tablet Commonly known as:  LIPITOR   carvedilol 6.25 MG tablet Commonly known as:  COREG   furosemide 40 MG tablet Commonly known as:  LASIX   triamcinolone cream 0.1 % Commonly known as:  KENALOG   warfarin 3 MG tablet Commonly known as:  COUMADIN     TAKE these medications   acetaminophen 325 MG tablet Commonly known as:  TYLENOL Take 2 tablets (650 mg total) by mouth every 6 (six) hours as needed.   glycopyrrolate 0.2 MG/ML injection Commonly known as:  ROBINUL Inject 1 mL (0.2 mg total) into the vein every 4 (four) hours as needed (secretions, gurgling).   loperamide 2 MG capsule Commonly known as:  IMODIUM Take 1 capsule (2 mg total) by mouth 3 (three) times daily as needed for diarrhea or loose stools.   simethicone 80 MG chewable tablet Commonly known as:  MYLICON Chew 1 tablet (80 mg total) by mouth 4 (four) times daily as needed for flatulence.      No Known  Allergies Contact information for after-discharge care    Destination    [facility] .   Service:  Skilled Nursing Contact information: [address] [place] [address] [phone]               The results of significant diagnostics from this hospitalization (including imaging, microbiology, ancillary and laboratory) are listed below for reference.    Significant Diagnostic Studies: Dg Chest 2 View  Result Date: 03/08/2018 CLINICAL DATA:  Initial evaluation for acute weakness.  Fall. EXAM: CHEST - 2 VIEW COMPARISON:  Prior radiograph from 04/27/2016. FINDINGS: Left-sided transvenous pacemaker/AICD in place. Cardiomegaly, stable. Mediastinal silhouette normal. Aortic atherosclerosis. Lungs normally inflated. Mild bibasilar atelectatic changes without focal infiltrate. No pulmonary edema or pleural effusion. No pneumothorax. No acute osseous abnormality. Degenerative changes about the left shoulder. IMPRESSION: 1. Mild bibasilar atelectasis. No other active cardiopulmonary disease. 2. Stable cardiomegaly without pulmonary edema. Electronically Signed   By: Rise Mu M.D.   On: 03/08/2018 16:49   Ct Head Wo Contrast  Result Date: 03/08/2018 CLINICAL DATA:  Altered level of consciousness. No reported injury. Left-sided lean. EXAM: CT HEAD WITHOUT CONTRAST CT CERVICAL SPINE WITHOUT CONTRAST TECHNIQUE: Multidetector CT imaging of the head and cervical spine was performed following the standard protocol without intravenous contrast. Multiplanar CT image reconstructions of the cervical spine were also generated. COMPARISON:  None. FINDINGS: CT HEAD FINDINGS Brain: No evidence of parenchymal hemorrhage or extra-axial fluid collection. No mass lesion, mass effect, or midline shift. No CT evidence of acute infarction. Generalized cerebral volume loss. Nonspecific mild subcortical and periventricular white matter hypodensity, most in keeping with chronic small  vessel ischemic change. No ventriculomegaly. Vascular: No acute abnormality. Skull: No evidence of calvarial fracture. Sinuses/Orbits: The visualized paranasal sinuses are essentially clear. Other:  The mastoid air cells are unopacified. CT CERVICAL SPINE FINDINGS Alignment: Normal cervical lordosis. No facet subluxation. Dens is well positioned between the lateral masses of C1. Mild 2 mm anterolisthesis at C7-T1. Skull base and vertebrae: No acute fracture. No primary bone lesion or focal pathologic process. Soft tissues and spinal canal: No prevertebral edema. No visible canal hematoma. Disc levels: Severe multilevel degenerative disc disease throughout the cervical spine with bulky anterior marginal osteophytes. Moderate bilateral facet arthropathy. Mild degenerative foraminal stenosis on the right at C5-6. Upper chest: Partially visualized 2 lead left subclavian ICD. Thoracic aortic atherosclerosis. Other: Visualized mastoid air cells appear clear. No discrete thyroid nodules. No pathologically enlarged cervical nodes. IMPRESSION: CT HEAD: 1.  No evidence of acute intracranial abnormality. 2. Generalized cerebral volume loss and mild chronic small vessel ischemic changes in the cerebral white matter. CT CERVICAL SPINE: 1. No cervical spine fracture or facet subluxation. 2. Severe multilevel degenerative changes in the cervical spine as detailed. Mild 2 mm anterolisthesis at C7-T1, probably degenerative. Electronically Signed   By: Delbert Phenix M.D.   On: 03/08/2018 16:42   Ct Cervical Spine Wo Contrast  Result Date: 03/08/2018 CLINICAL DATA:  Altered level of consciousness.  No reported injury. Left-sided lean. EXAM: CT HEAD WITHOUT CONTRAST CT CERVICAL SPINE WITHOUT CONTRAST TECHNIQUE: Multidetector CT imaging of the head and cervical spine was performed following the standard protocol without intravenous contrast. Multiplanar CT image reconstructions of the cervical spine were also generated. COMPARISON:   None. FINDINGS: CT HEAD FINDINGS Brain: No evidence of parenchymal hemorrhage or extra-axial fluid collection. No mass lesion, mass effect, or midline shift. No CT evidence of acute infarction. Generalized cerebral volume loss. Nonspecific mild subcortical and periventricular white matter hypodensity, most in keeping with chronic small vessel ischemic change. No ventriculomegaly. Vascular: No acute abnormality. Skull: No evidence of calvarial fracture. Sinuses/Orbits: The visualized paranasal sinuses are essentially clear. Other:  The mastoid air cells are unopacified. CT CERVICAL SPINE FINDINGS Alignment: Normal cervical lordosis. No facet subluxation. Dens is well positioned between the lateral masses of C1. Mild 2 mm anterolisthesis at C7-T1. Skull base and vertebrae: No acute fracture. No primary bone lesion or focal pathologic process. Soft tissues and spinal canal: No prevertebral edema. No visible canal hematoma. Disc levels: Severe multilevel degenerative disc disease throughout the cervical spine with bulky anterior marginal osteophytes. Moderate bilateral facet arthropathy. Mild degenerative foraminal stenosis on the right at C5-6. Upper chest: Partially visualized 2 lead left subclavian ICD. Thoracic aortic atherosclerosis. Other: Visualized mastoid air cells appear clear. No discrete thyroid nodules. No pathologically enlarged cervical nodes. IMPRESSION: CT HEAD: 1.  No evidence of acute intracranial abnormality. 2. Generalized cerebral volume loss and mild chronic small vessel ischemic changes in the cerebral white matter. CT CERVICAL SPINE: 1. No cervical spine fracture or facet subluxation. 2. Severe multilevel degenerative changes in the cervical spine as detailed. Mild 2 mm anterolisthesis at C7-T1, probably degenerative. Electronically Signed   By: Delbert Phenix M.D.   On: 03/08/2018 16:42   US Renal  Result Date: 03/09/2018 CLINICAL DATA:  Acute renal injury EXAM: RENAL / URINARY TRACT  ULTRASOUND COMPLETE COMPARISON:  CT 12/16/2011 FINDINGS: Right Kidney: Length: 12.9 cm. Thinning of the renal cortex is noted with mild-to-moderate hydronephrosis noted. Slight increase in cortical echogenicity. Several hypoechoic lesions of the liver are identified, the largest appearing anechoic and representing simple cysts measuring 2.4 x 2.2 x 2.7 cm arising off the lateral interpolar aspect and 1.9 x 1.5 x 1.9 cm arising off the lower pole. Several smaller indeterminate hypoechoic lesions are noted more likely to represent small complex cysts with low-level internal echoes noted. No nephrolithiasis is identified. No definite solid vascular masses are identified. Left Kidney: Length: 14.7 cm. Thinning of the renal cortex is also noted on the left. There is slight increase in cortical echogenicity as well. A dominant cyst measuring 6 x 5.5 x 5 cm is identified arising off the upper pole without worrisome features. Low-level internal echoes may represent debris or acoustic artifacts. No solid vascular lesions are identified. No hydronephrosis. Bladder: Appears normal for degree of bladder distention. Only a left ureteral jet could be documented. IMPRESSION: Increased echogenicity of both kidneys with cortical thinning and bilateral renal cysts some of which appear complex with low-level internal echoes. Increased echogenicity may reflect a component of medical renal disease. Mild-to-moderate right-sided hydronephrosis is noted and there was no demonstrable ureteral jet seen in the bladder to suggest patency of the right ureter. An obstructing calculus, stenosis of the ureter or ureteral lesion is not entirely excluded. Electronically Signed   By: Tollie Eth M.D.   On: 03/09/2018 17:33   Dg Hand Complete Right  Result Date: 03/08/2018 CLINICAL  DATA:  Initial evaluation for acute trauma, fall. EXAM: RIGHT HAND - COMPLETE 3+ VIEW COMPARISON:  None. FINDINGS: No acute fracture or dislocation. Bones are diffusely  osteopenic. Extensive arthritic changes seen throughout the right hand. Mild soft tissue swelling overlies the right fifth MCP joint. IMPRESSION: 1. No acute osseous abnormality about the right hand. 2. Extensive arthritic changes throughout the right hand with soft tissue swelling overlying the right fifth MCP joint. Electronically Signed   By: Rise Mu M.D.   On: 03/08/2018 16:58   Ct Renal Stone Study  Result Date: 03/10/2018 CLINICAL DATA:  Inpatient. Urinary tract stone, symptomatic. Right hydronephrosis on sonogram. EXAM: CT ABDOMEN AND PELVIS WITHOUT CONTRAST TECHNIQUE: Multidetector CT imaging of the abdomen and pelvis was performed following the standard protocol without IV contrast. COMPARISON:  03/09/2018 renal sonogram. 12/16/2011 CT abdomen/pelvis. FINDINGS: Lower chest: Cardiomegaly. ICD lead is seen in the right ventricle. Coronary atherosclerosis. Small dependent right pleural effusion. Bronchiectasis with associated 4.8 x 3.4 cm focus of masslike consolidation in the medial basilar left lower lobe (series 4/image 15), previously 4.9 x 3.7 cm on 12/16/2011 CT, not appreciably changed. Hepatobiliary: Diffusely irregular liver surface, compatible with hepatic cirrhosis. No liver mass. Contracted gallbladder with subcentimeter calcified gallstone. No definite gallbladder wall thickening. No biliary ductal dilatation. Pancreas: Normal, with no mass or duct dilation. Spleen: Normal size. No mass. Adrenals/Urinary Tract: Normal adrenals. Obstructing adjacent 11 mm and 6 mm stones in the proximal pelvic segment of the right ureter with moderate right hydroureteronephrosis. Nonobstructing 8 mm stone in the right renal pelvis. Nonobstructing 14 mm lower right renal stone. Additional 8 mm stone in the distal pelvic segment of the right ureter. Layering 6 mm and 4 mm right bladder stones. No left renal stones. No left hydronephrosis. Normal caliber left ureter, with no left ureteral stones.  Severe right renal parenchymal atrophy. Moderate left renal parenchymal atrophy. Simple renal cysts in both kidneys, largest 5.7 cm in the posterior upper left kidney. Multiple indeterminate hyperdense and hypodense renal lesions in both kidneys, largest 2.1 cm in the anterior lower right kidney (series 3/image 41), which has increased from 1.1 cm on 12/16/2011 CT. Otherwise normal bladder. Stomach/Bowel: Small hiatal hernia. Otherwise normal nondistended stomach. Normal caliber small bowel with no small bowel wall thickening. Normal appendix. Normal large bowel with no diverticulosis, large bowel wall thickening or pericolonic fat stranding. Vascular/Lymphatic: Atherosclerotic nonaneurysmal abdominal aorta. No pathologically enlarged lymph nodes in the abdomen or pelvis. Reproductive: Mildly enlarged prostate. Other: No pneumoperitoneum. Small volume ascites. Moderate anasarca. Musculoskeletal: No aggressive appearing focal osseous lesions. Diffuse patchy sclerosis in the osseous structures, probably renal osteodystrophy. Severe thoracolumbar spondylosis. IMPRESSION: 1. Multiple obstructing stones in the pelvic segment of the right ureter, with moderate right hydroureteronephrosis. Additional nonobstructing right renal and layering right bladder stones. 2. Numerous indeterminate renal lesions in both kidneys, largest 2.1 cm in the anterior lower right kidney, which has increased in size since 2013, cannot exclude renal cell carcinoma. MRI abdomen without and with IV contrast may be obtained on a short term outpatient basis for further characterization. If the patient cannot receive IV gadolinium contrast due to GFR less than 30, recommend attention on follow-up noncontrast CT or MRI abdomen in 6 months. 3. Cirrhosis. No noncontrast CT evidence of liver mass. Small volume ascites. 4. Small dependent right pleural effusion. Chronic bronchiectasis with associated masslike consolidation in the medial basilar left lower  lobe, not appreciably changed since 2013, presumably chronic postinfectious/postinflammatory scarring. 5. Chronic findings include:  Aortic Atherosclerosis (ICD10-I70.0). Cardiomegaly. Coronary atherosclerosis. Small hiatal hernia. Cholelithiasis. Mildly enlarged prostate. Renal osteodystrophy. Electronically Signed   By: Delbert Phenix M.D.   On: 03/10/2018 00:57    Microbiology: No results found for this or any previous visit (from the past 240 hour(s)).   Labs: Basic Metabolic Panel: Recent Labs  Lab 03/22/18 0550 03/23/18 0600 03/25/18 0605 03/26/18 0607 03/27/18 0612  NA 143 143 145 147* 151*  K 4.3 4.5 4.7 4.8 4.8  CL 104 102 107 109 112*  CO2 22 24 23 22 22   GLUCOSE 124* 129* 136* 118* 111*  BUN 160* 165* 177* 199* 208*  CREATININE 5.33* 6.06* 5.87* 6.05* 6.31*  CALCIUM 7.9* 8.1* 8.1* 8.2* 8.2*  PHOS 5.1*  --   --   --   --    Liver Function Tests: Recent Labs  Lab 03/22/18 0550  ALBUMIN 2.0*   No results for input(s): LIPASE, AMYLASE in the last 168 hours. No results for input(s): AMMONIA in the last 168 hours. CBC: No results for input(s): WBC, NEUTROABS, HGB, HCT, MCV, PLT in the last 168 hours. Cardiac Enzymes: No results for input(s): CKTOTAL, CKMB, CKMBINDEX, TROPONINI in the last 168 hours. BNP: BNP (last 3 results) Recent Labs    03/08/18 1552  BNP 2,285.4*    ProBNP (last 3 results) Recent Labs    10/14/17 1209  PROBNP 15,115*    CBG: No results for input(s): GLUCAP in the last 168 hours.     Signed:  Meredeth Ide MD.  [facility] 03/27/2018, 9:37 AM

## 2018-03-27 NOTE — Progress Notes (Signed)
Discharge to [facility]. Report given to [facility].

## 2018-04-11 ENCOUNTER — Encounter: Payer: Medicare PPO | Admitting: *Deleted

## 2018-04-12 ENCOUNTER — Encounter: Payer: Self-pay | Admitting: Cardiology

## 2018-04-13 DEATH — deceased

## 2018-08-09 IMAGING — CT CT CERVICAL SPINE W/O CM
4 of 7 series · 14 of 33 positions shown, 15 images · non-contrast
Comparison: None.

CLINICAL DATA: Altered level of consciousness. No reported injury.
Left-sided lean.

EXAM:
CT HEAD WITHOUT CONTRAST
CT CERVICAL SPINE WITHOUT CONTRAST
TECHNIQUE: Multidetector CT imaging of the head and cervical spine was
performed following the standard protocol without intravenous
contrast. Multiplanar CT image reconstructions of the cervical spine
were also generated.

[Series 4: head bone · axial · 0.45mm/px · z∈[-136,-54]mm · 3 of 83 slices shown]
[im 21/83  bone]
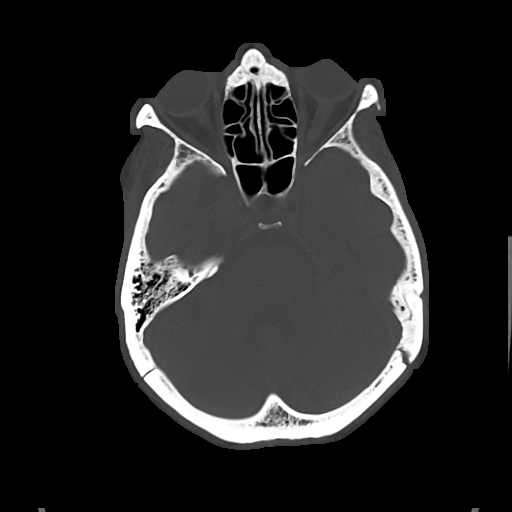
[im 42/83  bone]
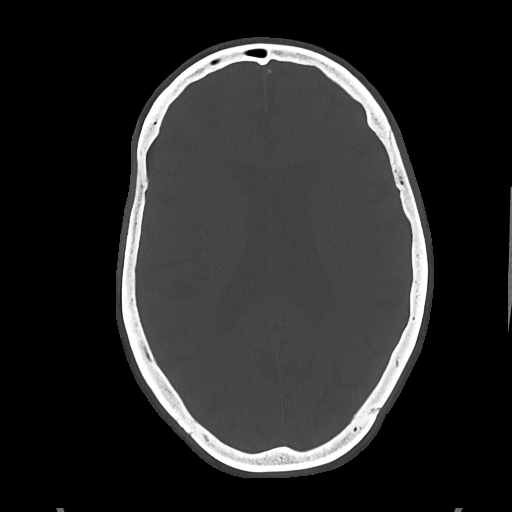
[im 62/83  bone]
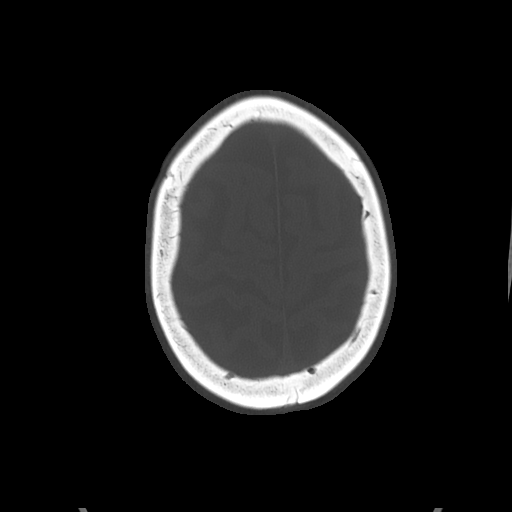

[Series 5: cor soft · coronal · 0.35mm/px · 3 of 73 slices shown]
[im 19/73  bone]
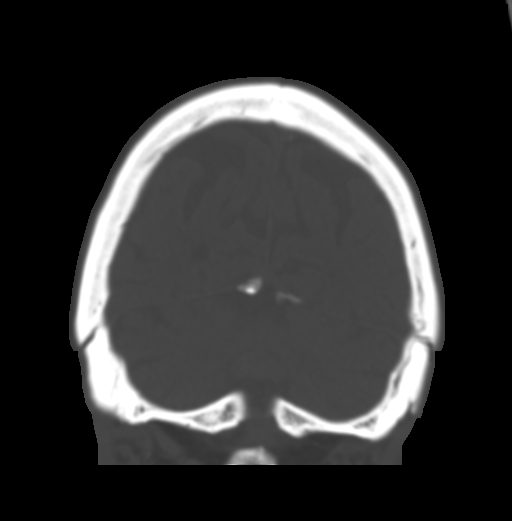
[im 37/73  bone]
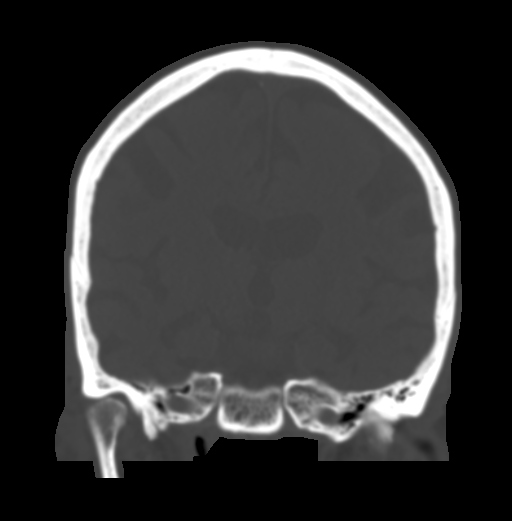
[im 55/73  bone]
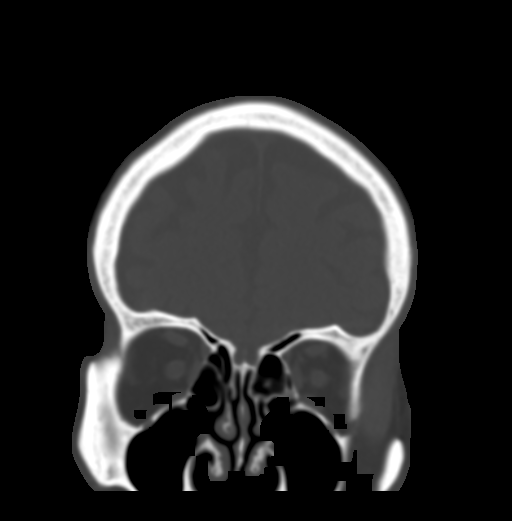

[Series 8: c spine soft · axial · 0.47mm/px · z∈[-281,-157]mm · 4 of 100 slices shown, 5 images]
[im 20/100  soft-tissue]
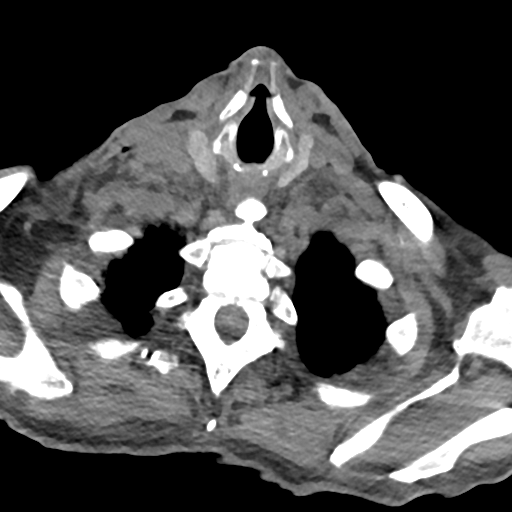
[im 20/100  bone]
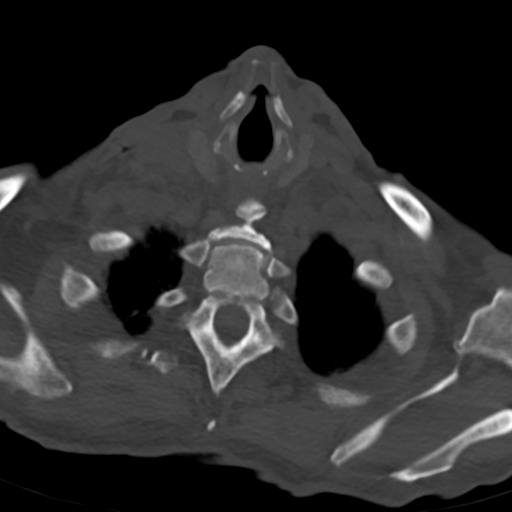
[im 40/100  bone]
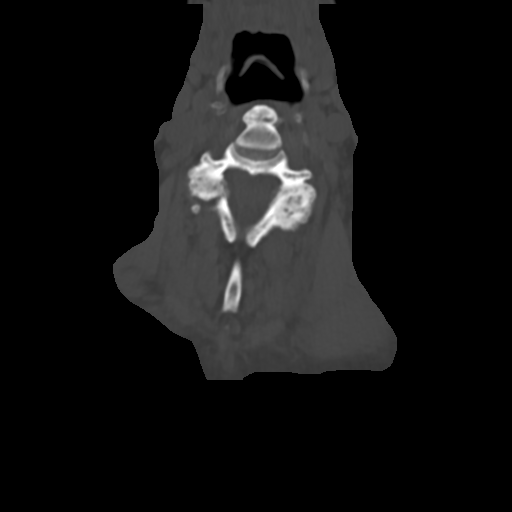
[im 60/100  bone]
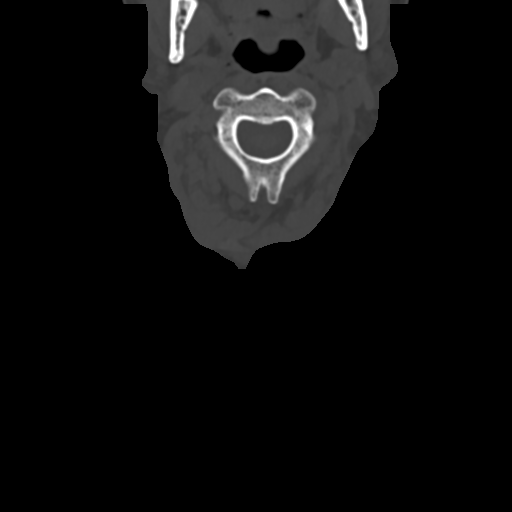
[im 80/100  bone]
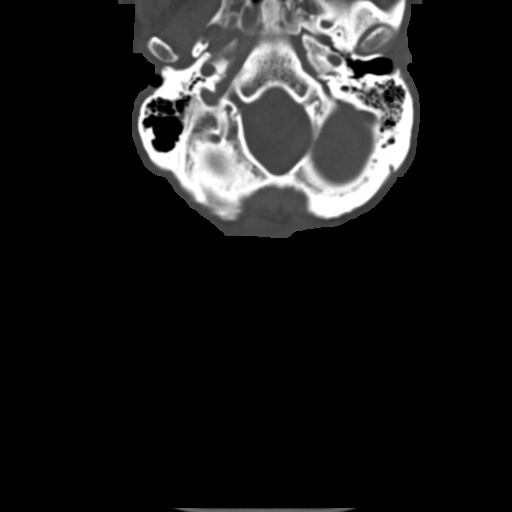

[Series 9: sag bone · sagittal · 0.27mm/px · 4 of 63 slices shown]
[im 13/63  bone]
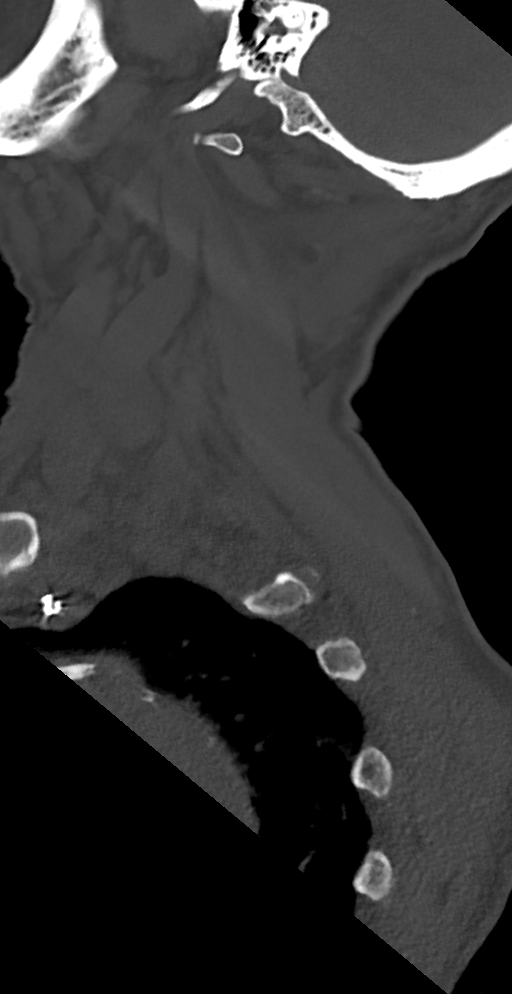
[im 25/63  bone]
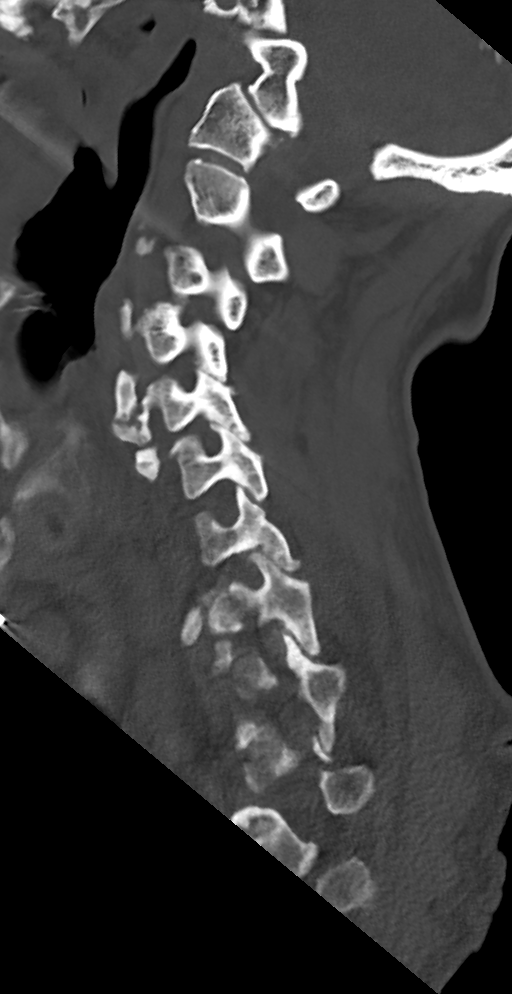
[im 38/63  bone]
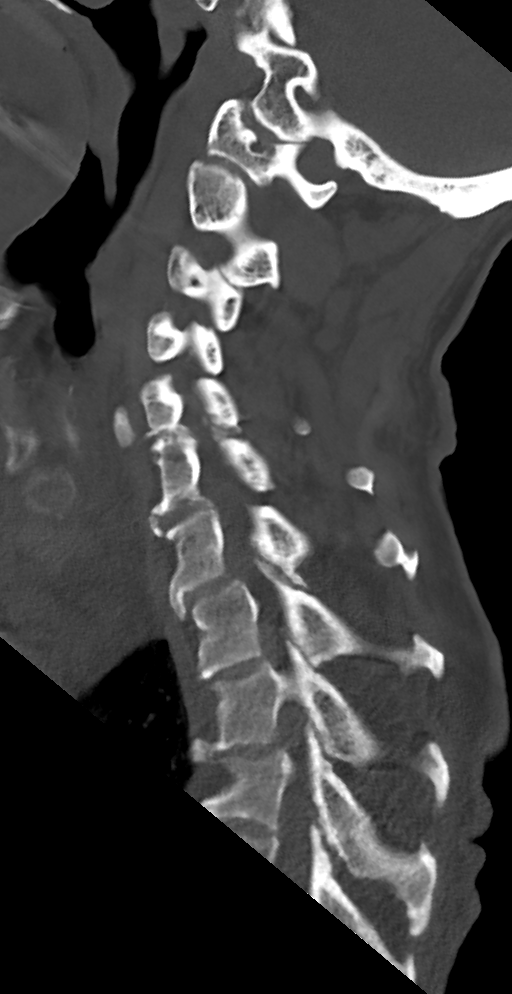
[im 50/63  bone]
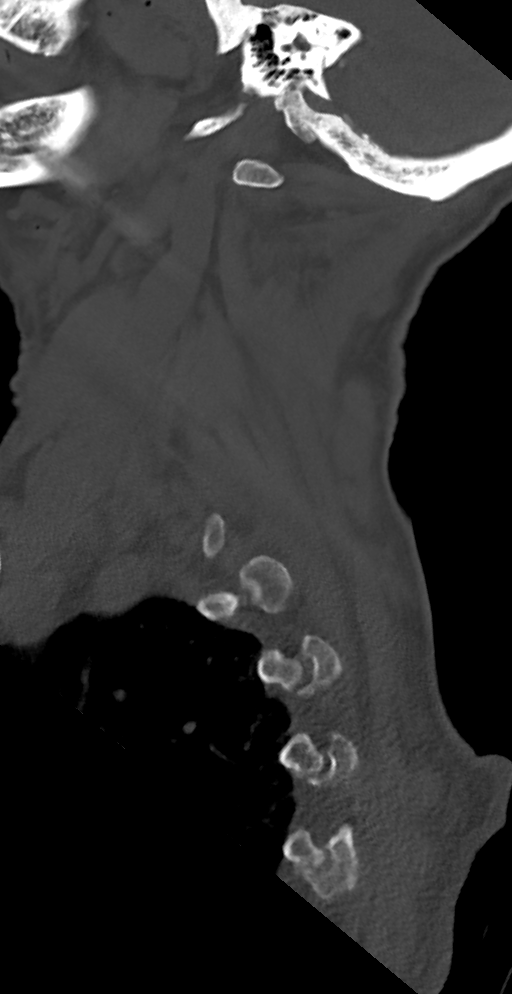

[14 of 33 positions shown; findings below may reference images not displayed]

FINDINGS: CT HEAD FINDINGS

Brain: No evidence of parenchymal hemorrhage or extra-axial fluid
collection. No mass lesion, mass effect, or midline shift. No CT
evidence of acute infarction. Generalized cerebral volume loss.
Nonspecific mild subcortical and periventricular white matter
hypodensity, most in keeping with chronic small vessel ischemic
change. No ventriculomegaly.

Vascular: No acute abnormality.

Skull: No evidence of calvarial fracture.

Sinuses/Orbits: The visualized paranasal sinuses are essentially
clear.

Other:  The mastoid air cells are unopacified.

CT CERVICAL SPINE FINDINGS

Alignment: Normal cervical lordosis. No facet subluxation. Dens is
well positioned between the lateral masses of C1. Mild 2 mm
anterolisthesis at C7-T1.

Skull base and vertebrae: No acute fracture. No primary bone lesion
or focal pathologic process.

Soft tissues and spinal canal: No prevertebral edema. No visible
canal hematoma.

Disc levels: Severe multilevel degenerative disc disease throughout
the cervical spine with bulky anterior marginal osteophytes.
Moderate bilateral facet arthropathy. Mild degenerative foraminal
stenosis on the right at C5-6.

Upper chest: Partially visualized 2 lead left subclavian ICD.
Thoracic aortic atherosclerosis.

Other: Visualized mastoid air cells appear clear. No discrete
thyroid nodules. No pathologically enlarged cervical nodes.
IMPRESSION: CT HEAD:

1.  No evidence of acute intracranial abnormality.
2. Generalized cerebral volume loss and mild chronic small vessel
ischemic changes in the cerebral white matter.

CT CERVICAL SPINE:

1. No cervical spine fracture or facet subluxation.
2. Severe multilevel degenerative changes in the cervical spine as
detailed. Mild 2 mm anterolisthesis at C7-T1, probably degenerative.

## 2018-08-11 IMAGING — CT CT RENAL STONE PROTOCOL
2 of 4 series · 15 of 46 positions shown, 17 images · non-contrast
Comparison: 03/09/2018 renal sonogram. 12/16/2011 CT
abdomen/pelvis.

CLINICAL DATA: Inpatient. Urinary tract stone, symptomatic. Right
hydronephrosis on sonogram.

EXAM:
CT ABDOMEN AND PELVIS WITHOUT CONTRAST
TECHNIQUE: Multidetector CT imaging of the abdomen and pelvis was performed
following the standard protocol without IV contrast.

[Series 3: stone study 5.0 i30f 2 · axial · 0.96mm/px · z∈[+714,+1124]mm · 12 of 94 slices shown, 14 images]
[im 8/94  soft-tissue]
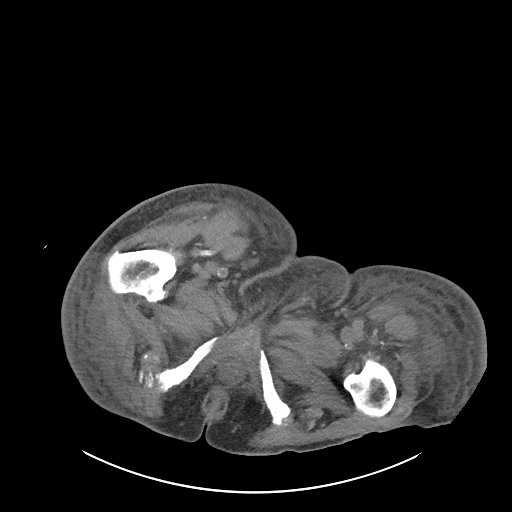
[im 8/94  bone]
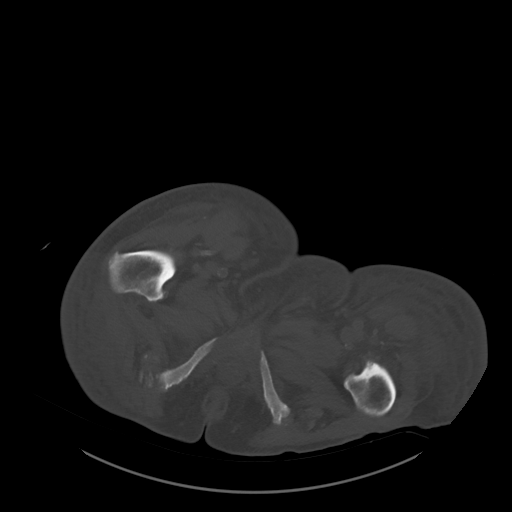
[im 15/94  soft-tissue]
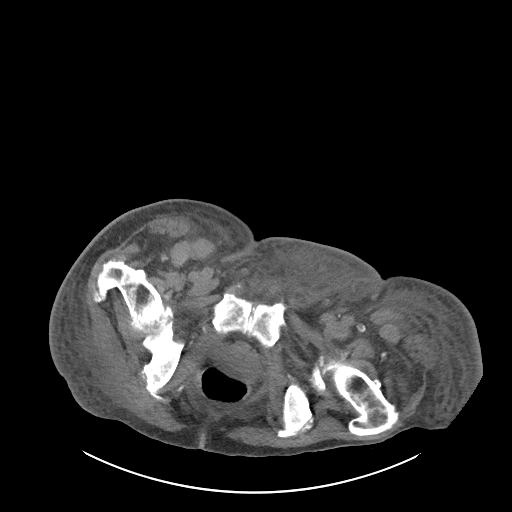
[im 23/94  soft-tissue]
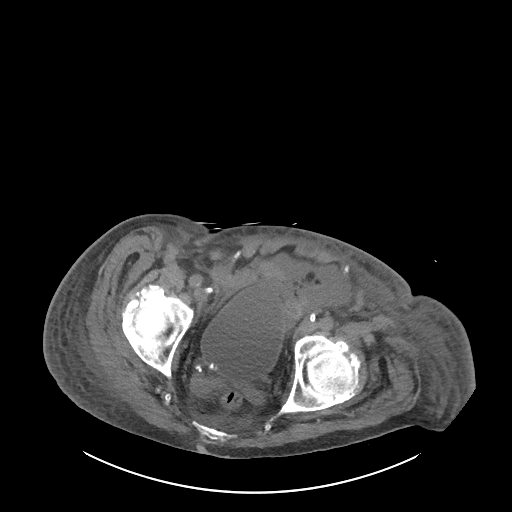
[im 30/94  soft-tissue]
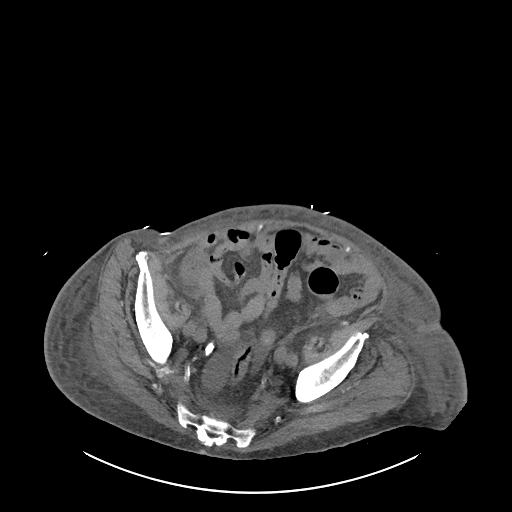
[im 38/94  soft-tissue]
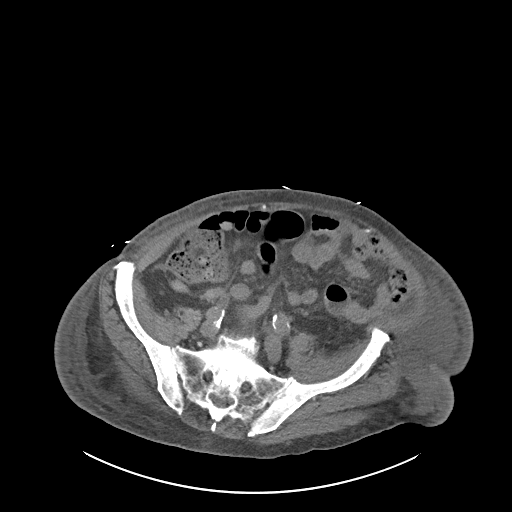
[im 45/94  soft-tissue]
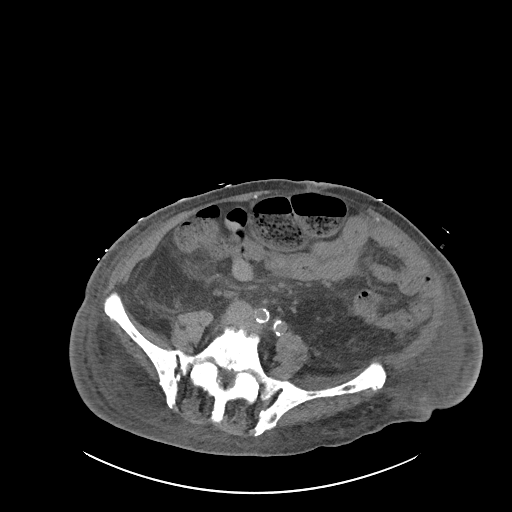
[im 53/94  soft-tissue]
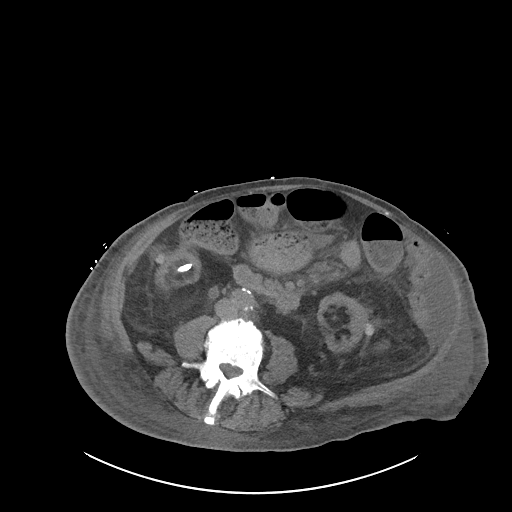
[im 60/94  soft-tissue]
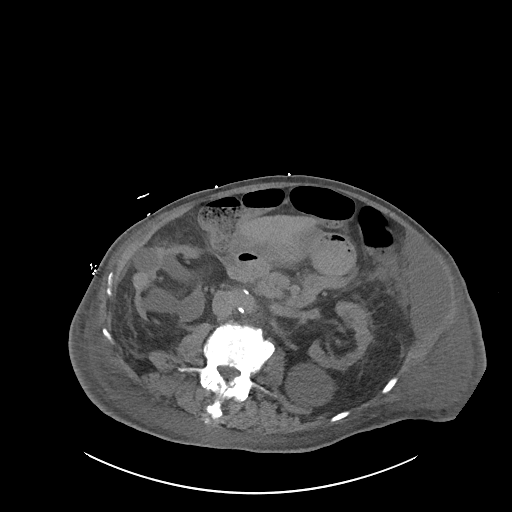
[im 67/94  soft-tissue]
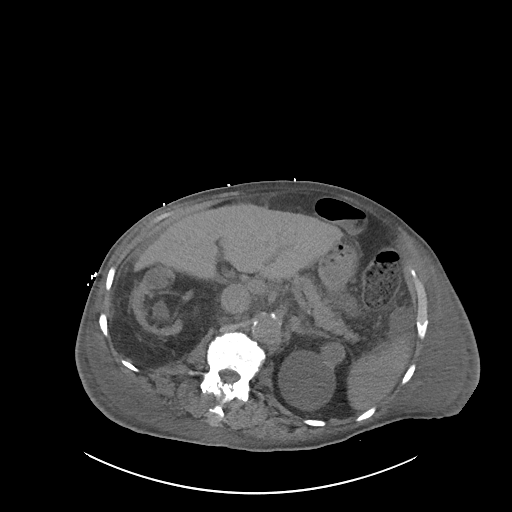
[im 67/94  bone]
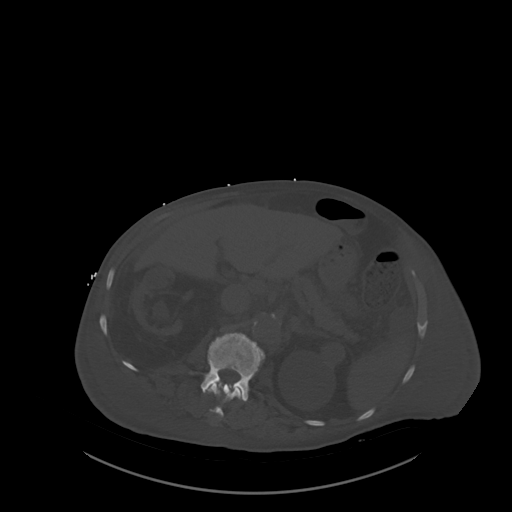
[im 75/94  soft-tissue]
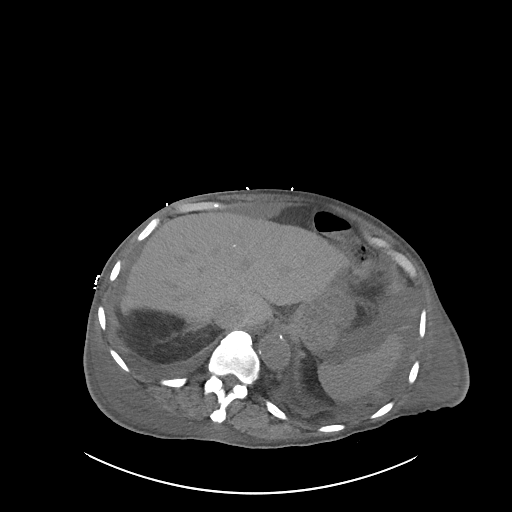
[im 82/94  soft-tissue]
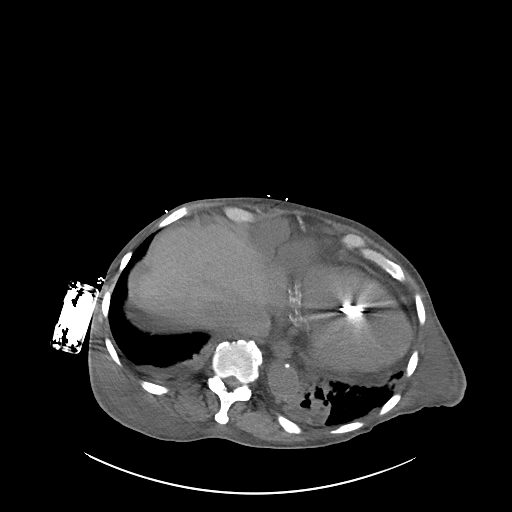
[im 90/94  soft-tissue]
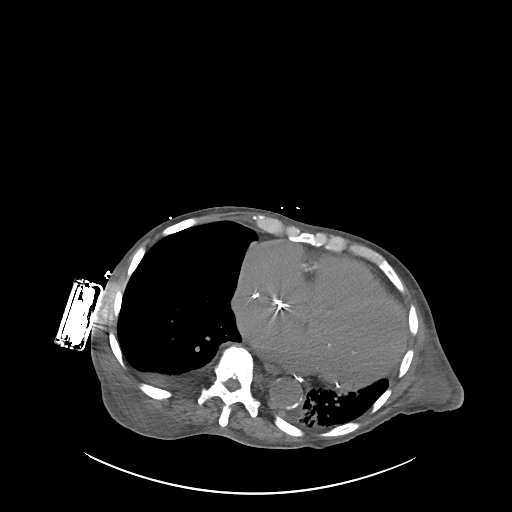

[Series 6: coronal soft tissue · coronal · 0.90mm/px · 3 of 100 slices shown]
[im 34/100  soft-tissue]
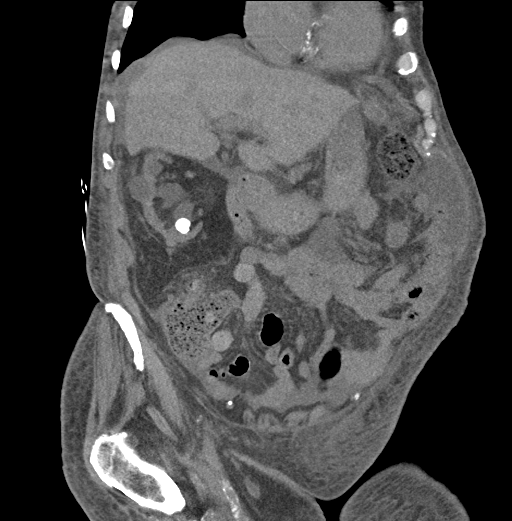
[im 45/100  soft-tissue]
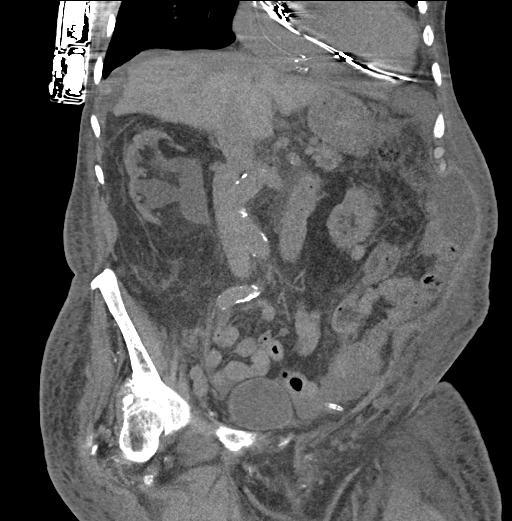
[im 56/100  soft-tissue]
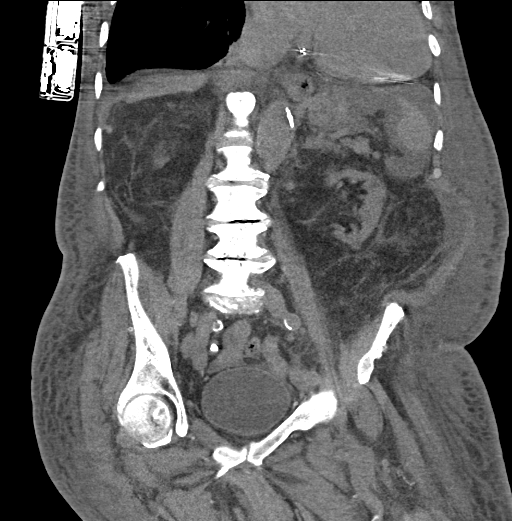

[15 of 46 positions shown; findings below may reference images not displayed]

FINDINGS: Lower chest: Cardiomegaly. ICD lead is seen in the right ventricle.
Coronary atherosclerosis. Small dependent right pleural effusion.
Bronchiectasis with associated 4.8 x 3.4 cm focus of masslike
consolidation in the medial basilar left lower lobe (series 4/image
15), previously 4.9 x 3.7 cm on 12/16/2011 CT, not appreciably
changed.

Hepatobiliary: Diffusely irregular liver surface, compatible with
hepatic cirrhosis. No liver mass. Contracted gallbladder with
subcentimeter calcified gallstone. No definite gallbladder wall
thickening. No biliary ductal dilatation.

Pancreas: Normal, with no mass or duct dilation.

Spleen: Normal size. No mass.

Adrenals/Urinary Tract: Normal adrenals. Obstructing adjacent 11 mm
and 6 mm stones in the proximal pelvic segment of the right ureter
with moderate right hydroureteronephrosis. Nonobstructing 8 mm stone
in the right renal pelvis. Nonobstructing 14 mm lower right renal
stone. Additional 8 mm stone in the distal pelvic segment of the
right ureter. Layering 6 mm and 4 mm right bladder stones. No left
renal stones. No left hydronephrosis. Normal caliber left ureter,
with no left ureteral stones. Severe right renal parenchymal
atrophy. Moderate left renal parenchymal atrophy. Simple renal cysts
in both kidneys, largest 5.7 cm in the posterior upper left kidney.
Multiple indeterminate hyperdense and hypodense renal lesions in
both kidneys, largest 2.1 cm in the anterior lower right kidney
(series 3/image 41), which has increased from 1.1 cm on 12/16/2011
CT. Otherwise normal bladder.

Stomach/Bowel: Small hiatal hernia. Otherwise normal nondistended
stomach. Normal caliber small bowel with no small bowel wall
thickening. Normal appendix. Normal large bowel with no
diverticulosis, large bowel wall thickening or pericolonic fat
stranding.

Vascular/Lymphatic: Atherosclerotic nonaneurysmal abdominal aorta.
No pathologically enlarged lymph nodes in the abdomen or pelvis.

Reproductive: Mildly enlarged prostate.

Other: No pneumoperitoneum. Small volume ascites. Moderate anasarca.

Musculoskeletal: No aggressive appearing focal osseous lesions.
Diffuse patchy sclerosis in the osseous structures, probably renal
osteodystrophy. Severe thoracolumbar spondylosis.
IMPRESSION: 1. Multiple obstructing stones in the pelvic segment of the right
ureter, with moderate right hydroureteronephrosis. Additional
nonobstructing right renal and layering right bladder stones.
2. Numerous indeterminate renal lesions in both kidneys, largest
cm in the anterior lower right kidney, which has increased in size
since 5835, cannot exclude renal cell carcinoma. MRI abdomen without
and with IV contrast may be obtained on a short term outpatient
basis for further characterization. If the patient cannot receive IV
gadolinium contrast due to GFR less than 30, recommend attention on
follow-up noncontrast CT or MRI abdomen in 6 months.
3. Cirrhosis. No noncontrast CT evidence of liver mass. Small volume
ascites.
4. Small dependent right pleural effusion. Chronic bronchiectasis
with associated masslike consolidation in the medial basilar left
lower lobe, not appreciably changed since 5835, presumably chronic
postinfectious/postinflammatory scarring.
5. Chronic findings include: Aortic Atherosclerosis ([id number]).
Cardiomegaly. Coronary atherosclerosis. Small hiatal hernia.
Cholelithiasis. Mildly enlarged prostate. Renal osteodystrophy.
